# Patient Record
Sex: Female | Born: 1950
Health system: Southern US, Community
[De-identification: ages and names within clinical notes are randomized; demographics above are authoritative.]

## PROBLEM LIST (undated history)

## (undated) DIAGNOSIS — F32A Depression, unspecified: Secondary | ICD-10-CM

## (undated) DIAGNOSIS — C801 Malignant (primary) neoplasm, unspecified: Secondary | ICD-10-CM

## (undated) DIAGNOSIS — I10 Essential (primary) hypertension: Secondary | ICD-10-CM

## (undated) DIAGNOSIS — F329 Major depressive disorder, single episode, unspecified: Secondary | ICD-10-CM

## (undated) DIAGNOSIS — E119 Type 2 diabetes mellitus without complications: Secondary | ICD-10-CM

## (undated) DIAGNOSIS — K219 Gastro-esophageal reflux disease without esophagitis: Secondary | ICD-10-CM

## (undated) HISTORY — PX: OTHER SURGICAL HISTORY: SHX169

## (undated) HISTORY — PX: CHOLECYSTECTOMY: SHX55

---

## 1997-09-20 ENCOUNTER — Other Ambulatory Visit: Admission: RE | Admit: 1997-09-20 | Discharge: 1997-09-20 | Payer: Self-pay | Admitting: *Deleted

## 1997-09-26 ENCOUNTER — Other Ambulatory Visit: Admission: RE | Admit: 1997-09-26 | Discharge: 1997-09-26 | Payer: Self-pay | Admitting: *Deleted

## 1998-09-11 ENCOUNTER — Emergency Department (HOSPITAL_COMMUNITY): Admission: EM | Admit: 1998-09-11 | Discharge: 1998-09-11 | Payer: Self-pay | Admitting: *Deleted

## 1998-10-31 ENCOUNTER — Ambulatory Visit (HOSPITAL_COMMUNITY): Admission: RE | Admit: 1998-10-31 | Discharge: 1998-10-31 | Payer: Self-pay | Admitting: *Deleted

## 1998-10-31 ENCOUNTER — Encounter (INDEPENDENT_AMBULATORY_CARE_PROVIDER_SITE_OTHER): Payer: Self-pay

## 1998-12-18 ENCOUNTER — Other Ambulatory Visit: Admission: RE | Admit: 1998-12-18 | Discharge: 1998-12-18 | Payer: Self-pay | Admitting: *Deleted

## 2000-01-21 ENCOUNTER — Other Ambulatory Visit: Admission: RE | Admit: 2000-01-21 | Discharge: 2000-01-21 | Payer: Self-pay | Admitting: Obstetrics and Gynecology

## 2000-07-08 ENCOUNTER — Encounter: Admission: RE | Admit: 2000-07-08 | Discharge: 2000-07-08 | Payer: Self-pay | Admitting: General Surgery

## 2000-07-08 ENCOUNTER — Encounter: Payer: Self-pay | Admitting: General Surgery

## 2000-07-14 ENCOUNTER — Ambulatory Visit (HOSPITAL_COMMUNITY): Admission: RE | Admit: 2000-07-14 | Discharge: 2000-07-14 | Payer: Self-pay | Admitting: General Surgery

## 2000-07-14 ENCOUNTER — Encounter: Payer: Self-pay | Admitting: General Surgery

## 2000-07-22 ENCOUNTER — Encounter (INDEPENDENT_AMBULATORY_CARE_PROVIDER_SITE_OTHER): Payer: Self-pay | Admitting: *Deleted

## 2000-07-22 ENCOUNTER — Encounter: Payer: Self-pay | Admitting: General Surgery

## 2000-07-22 ENCOUNTER — Ambulatory Visit (HOSPITAL_BASED_OUTPATIENT_CLINIC_OR_DEPARTMENT_OTHER): Admission: RE | Admit: 2000-07-22 | Discharge: 2000-07-23 | Payer: Self-pay | Admitting: General Surgery

## 2000-08-04 ENCOUNTER — Inpatient Hospital Stay (HOSPITAL_COMMUNITY): Admission: AD | Admit: 2000-08-04 | Discharge: 2000-08-07 | Payer: Self-pay | Admitting: General Surgery

## 2000-08-19 ENCOUNTER — Ambulatory Visit (HOSPITAL_COMMUNITY): Admission: RE | Admit: 2000-08-19 | Discharge: 2000-08-19 | Payer: Self-pay | Admitting: General Surgery

## 2000-08-19 ENCOUNTER — Encounter: Payer: Self-pay | Admitting: General Surgery

## 2004-12-23 ENCOUNTER — Ambulatory Visit (HOSPITAL_COMMUNITY): Admission: RE | Admit: 2004-12-23 | Discharge: 2004-12-23 | Payer: Self-pay

## 2006-09-04 ENCOUNTER — Ambulatory Visit (HOSPITAL_COMMUNITY): Admission: RE | Admit: 2006-09-04 | Discharge: 2006-09-05 | Payer: Self-pay | Admitting: Neurosurgery

## 2010-03-18 DIAGNOSIS — I10 Essential (primary) hypertension: Secondary | ICD-10-CM | POA: Insufficient documentation

## 2010-03-18 DIAGNOSIS — E1159 Type 2 diabetes mellitus with other circulatory complications: Secondary | ICD-10-CM | POA: Insufficient documentation

## 2010-03-20 DIAGNOSIS — F32A Depression, unspecified: Secondary | ICD-10-CM | POA: Insufficient documentation

## 2010-03-20 DIAGNOSIS — C439 Malignant melanoma of skin, unspecified: Secondary | ICD-10-CM | POA: Insufficient documentation

## 2010-03-20 DIAGNOSIS — E782 Mixed hyperlipidemia: Secondary | ICD-10-CM | POA: Insufficient documentation

## 2010-05-21 NOTE — Op Note (Signed)
NAMEROSALEE, TOLLEY                 ACCOUNT NO.:  [id number]   MEDICAL RECORD NO.:  [id number]          PATIENT TYPE:  OIB   LOCATION:  5153                         FACILITY:  [facility]   PHYSICIAN:  Coletta Memos, M.D.     DATE OF BIRTH:  24-May-1950   DATE OF PROCEDURE:  09/04/2006  DATE OF DISCHARGE:  09/05/2006                               OPERATIVE REPORT   PREOPERATIVE DIAGNOSES:  1. Left C5 radiculopathy, C6 radiculopathy.  2. Displaced disk at C4-5.  3. Spondylosis, C4-5, C5-6.  4. Neck pain.   PROCEDURES:  1. Anterior cervical decompression, C4-5, C5-6.  2. Arthrodesis, C4 to C6, using two 6-mm allografts.  3. Anterior instrumentation, 34-mm Vector plate with 09-WJ screws.   SURGEON:  Coletta Memos, M.D.   ASSISTANT:  Hewitt Shorts, M.D.   COMPLICATIONS:  None.   ANESTHESIA:  General endotracheal.   INDICATIONS:  Gloria Rose is a 60 year old who presented to the office  with profound weakness in the left upper extremity in the left shoulder  and deltoid.  MRI showed a very large herniated disk on the left side at  C4-5.  She also had a fairly large osteophyte at C5-6, also on the left  side, causing foraminal narrowing.  I therefore recommended and she  agreed to undergo a two-level decompression and arthrodesis.   OPERATIVE NOTE:  Gloria Rose was brought to the operating room, intubated  and placed under a general anesthetic without difficulty.  The neck was  prepped and she was draped in a sterile fashion after it was placed on a  horseshoe headrest in approximately neutral position.  I infiltrated 4  mL 0.5% lidocaine and 1:200,000 strength epinephrine into the skin on  the left side of the neck starting from the midline, extending to the  medial border of the left sternocleidomastoid.  Mirroring my injection,  I opened the skin with a #10 blade.  I took that down to the platysma  through subcutaneous fat.  I dissected with Metzenbaum scissors in the  plane above  the platysma.  I then opened the platysma in a horizontal  fashion using the Metzenbaum scissors.  I dissected rostrally and  caudally inferior to the platysma.  I then was able to identify the  medial strap muscles, sternocleidomastoid, and with both sharp and blunt  dissection created an avascular corridor to the cervical spine.  I  placed a spinal needle into the disk space and that was shown to be at  C4-5.  I then reflected the longus colli muscles bilaterally from C4 to  C6.  I placed a self-retaining retractor around the disk space of C4-5.  I opened the disk space with a #15 blade and removed some disk material.  I then placed distraction pins, one at C4, the other in C5, and  distracted the disk space.  I used pituitary rongeurs, curettes,  Kerrison punches and a high-speed drill to remove endplate and disk  material.  I brought the microscope into the operative field at that  time.  I then removed what were three very  large fragments of disk the  overlying the left C5 nerve root.  This was done without difficulty.  On  the right side I did not do an aggressive decompression of the nerve  roots as there was no compression seen or appreciated intraoperatively,  but I did fully decompress the spinal canal and the left C5 root.  I  achieved hemostasis using Gelfoam and patience.   I then prepared for arthrodesis.  I used the drill to even the surfaces  of C4 and C5.  I sized the space and felt that a 6-mm graft would be  appropriate.  I then placed a 6-mm graft to complete the arthrodesis at  C4-5.   I removed the distraction pin from C4 and placed it into C6.  I moved  the self-retaining retractor caudally to overlie the disk to expose the  disk space at C5-6.  I then had to use the drill to actually drill away  the calcified disk surface at C5-6 until I could get into the disk  space.  I then brought the microscope back into the operative field.  I  then used a drill extensively  to remove osteophytes at the C5-6 level.  I used two different burs to create more space and again to drill down  the osteophytes.  I then was able to use a small micro hook to lift up  the bone there and create an entry for a Kerrison punch.  I then used a  Kerrison sponge and created a trowel and exposed the dura.  Then again  going back to the drill and the punch, I was able to then fully expose  the dura and fully decompress the spinal canal and both C6 nerve roots.  I irrigated the wound.  With Dr. Newell Coral assistance, I then prepared  for arthrodesis.   I evened the bony surfaces at C5 and C6 and placed a 6-mm structural  allograft into the disk space.  I then turned my attention to the  anterior instrumentation.  Using a 34-mm plate and a drill, I drilled  and placed six screws , two in C4, two in C5, and two in C6.  Each of  the screws was self-tapping.  The plate was placed without difficulty.  X-ray showed the plate, screws and bone plugs were in good position.  I  then irrigated the wound.  I then closed the wound in a layered fashion  using Vicryl sutures to reapproximate the platysma and subcuticular  layers.  Dermabond was used for a sterile dressing.           ______________________________  Coletta Memos, M.D.     KC/MEDQ  D:  09/04/2006  T:  09/05/2006  Job:  [id number]

## 2010-05-24 NOTE — Op Note (Signed)
[place]. [facility]  Patient:    Gloria Rose, Gloria Rose                        MRN: [id number] Proc. Date: 07/22/00 Adm. Date:  [id number] Disc. Date: [id number] Attending:  Janalyn Rouse                           Operative Report  PREOPERATIVE DIAGNOSIS:  Malignant melanoma, distal aspect of left great toe.  POSTOPERATIVE DIAGNOSIS:  Malignant melanoma, distal aspect of left great toe.  OPERATION PERFORMED:  Amputation of left great toe at interphalangeal joint level.  SURGEON:  Loreta Ave, M.D.  ANESTHESIA:  General.  ESTIMATED BLOOD LOSS:  Minimal.  TOURNIQUET TIME:  45 minutes.  SPECIMENS:  Excised amputated toe sent for staging of melanoma.  DESCRIPTION OF PROCEDURE:  The patient was brought to the operating room and after adequate anesthesia had been obtained under the care of Dr. Francina Ames, she had undergone excision of a sentinel node for staging of her cancer. While still under general anesthesia, I intervened with primary amputation of her left great toe.  A tourniquet applied left leg.  The leg was then prepped and draped in the usual sterile fashion.  Exsanguinated elevation Esmarch. Tourniquet inflated.  The toe which had staining with methylene blue from injection for sentinel lymph node had an obvious pigmented melanoma at the distal aspect.  A fish mouth plantar based excision was done at the level of the interphalangeal joint staying well away from the margins of the obvious lesion at the distal aspect of the great toe.  The great toe was excised en bloc distal to the interphalangeal joint after dissection after dissection was carried down to that level and disarticulation performed with a sharp cutting of neurovascular bundles and appropriate hemostasis.  Specimen was to be sent to pathology.  Wound irrigated.  Remaining articular cartilage was removed with a rongeur to a nice contoured surface of the proximal phalanx.   The extensors and flexors were then sewn together with Vicryl over the end of the phalanx leaving the sesamoid intact on the plantar aspect which was located at the IP joint.  Skin was then closed with interrupted nylon bringing this into a nice closure with the incision line being slightly dorsal utilizing a slightly longer plantar flap.  Sterile compressive dressing was applied. Preoperative digital block had already been performed by Dr. Maple Hudson for postoperative analgesia.  Tourniquet deflated.  Once Dr. Maple Hudson and I had both completed anesthesia reversed.  Brought to recovery room.  Tolerated surgery well.  No complications. DD:  08/02/00 TD:  08/03/00 Job: [id number]

## 2010-05-24 NOTE — Op Note (Signed)
[place]. [facility]  Patient:    Gloria Rose, Gloria Rose                        MRN: [id number] Proc. Date: 07/22/00 Adm. Date:  [id number] Attending:  Janalyn Rouse                           Operative Report  PREOPERATIVE DIAGNOSIS:  Melanoma of the left great toe.  POSTOPERATIVE DIAGNOSIS:  Melanoma of the left great toe.  OPERATION PERFORMED: 1. Blue dye injection. 2. Left inguinal sentinel lymph node biopsy. 3. Left toe amputation.  SURGEON:  Rose Phi. Maple Hudson, M.D.  ASSISTANT:  Loreta Ave, M.D.  ANESTHESIA:  General.  INDICATIONS FOR PROCEDURE:  This patient presented with a melanoma of the left great toe.  She had had punch biopsies done which showed a 0.75 mm thick superficial spreading melanoma.  She had a preoperative lymphoscintigram done which showed that the drainage was to the inguinal area.  DESCRIPTION OF PROCEDURE:  Prior to coming to the operating room, 1 mCi of technetium sulfur colloid was injected intradermally around the melanoma. After suitable general anesthesia was induced the patient was placed in the supine position.  1 cc of Lymphazurin blue was then injected intradermally around the melanoma that was in the pulp portion of the distal left great toe. Scanning of the left inguinal area revealed a hot spot which was marked.  We then prepped the foot, ankle and the inguinal area and draped it appropriately.  With the Neoprobe, I marked the hot spot in the left inguinal area and then a vertical incision was made overlying it.  We dissected through the subcutaneous tissues and put a self-retaining retractor in.  One could identify a blue and hot lymph node, probably two, and I excised those using a cautery.  Scanning of the inguinal area revealed no other hot spots.  These nodes were submitted to the pathologist.  The incision was closed with 3-0 Vicryl for the subcutaneous tissues and staples for the skin.  In the  meantime, Dr. Eulah Pont did the amputation of the left great toe which will be in a separate operative note. DD:  07/22/00 TD:  07/22/00 Job: [id number]

## 2010-05-24 NOTE — H&P (Signed)
[place]. [facility]  Patient:    Gloria Rose, Gloria Rose Visit Number: [id number] MRN: [id number]          Service Type: MED Location: [phone] Attending Physician:  Janalyn Rouse Dictated by:   Rose Phi. Maple Hudson, M.D. Adm. Date:  08/04/2000 Disc. Date: 08/07/2000                           History and Physical  HISTORY OF PRESENT ILLNESS:  This 60 year old female had presented with lesion in her distal left great toe which turned out to be a melanoma.  She had been seen in consultation by Dr. Leticia Penna at [facility], and then referred here. She underwent an amputation of the distal toe, with primary closure, and a sentinel lymph node biopsy.  The sentinel node turned out to be negative, and she had good margins and a favorable lesion with her toe, but she presented this morning in the office with a cellulitis of the groin.  This was opened and drained, some mainly serous fluid.  She was started on Cipro, but she then developed a temperature of 104 degrees, and she was then admitted to the hospital.  PAST SURGICAL HISTORY:  Just consisted of the melanoma surgery as noted above.  ALLERGIES:  PENICILLIN AND RED DYE.  FAMILY HISTORY:  Not remarkable.  REVIEW OF SYSTEMS:  Otherwise within normal limits.  PHYSICAL EXAMINATION:  VITAL SIGNS:  Temperature 103 degrees, pulse rate 117, respirations 20, blood pressure 160/80.  HEENT:  Within normal limits.  NECK:  Supple, without masses.  LUNGS:  Clear to auscultation.  HEART:  Normal sinus rhythm without murmurs.  BREASTS:  Bilateral examination was within normal limits.  ABDOMEN:  Completely within normal limits.  EXTREMITIES:  She had a cellulitis of her left groin, and she had a well-healing amputation of the left great toe.  IMPRESSION:  Postoperative infection from a sentinel node biopsy in the left inguinal area.  PLAN:  She is admitted for IV antibiotics. Dictated by:   Rose Phi. Maple Hudson,  M.D. Attending Physician:  Janalyn Rouse DD:  09/01/00 TD:  09/01/00 Job: [id number]

## 2010-05-24 NOTE — Discharge Summary (Signed)
[place]. [facility]  Patient:    Gloria Rose, Gloria Rose Visit Number: [id number] MRN: [id number]          Service Type: MED Location: 5000 5006 01 Attending Physician:  Janalyn Rouse Dictated by:   Rose Phi. Maple Hudson, M.D. Adm. Date:  08/04/2000 Disc. Date: 08/07/2000                             Discharge Summary  HISTORY OF PRESENT ILLNESS:  This 60 year old white married female had undergone amputation of the distal left toe and a left inguinal sentinel lymph node biopsy for melanoma.  She had done well, with favorable findings except she developed a postoperative cellulitis of the left inguinal area with a temperature spike to 104.  She was then admitted for treatment.  Physical findings were noted above.  HOSPITAL COURSE:  She was placed on IV Cipro, and she had pretty prompt resolution of her fever and her white count.  By the third day, she was doing well and was discharged on Cipro orally, to be followed as an outpatient.  FINAL DIAGNOSIS:  Postoperative wound infection of the left inguinal sentinel node biopsy site from a melanoma.  OPERATIONS:  None.  CONDITION ON DISCHARGE:  Improved.  DISCHARGE MEDICATIONS:  Cipro 500 b.i.d.  DIET:  Regular.  ACTIVITY:  Limited.  FOLLOW-UP:  To be seen in five days in the office. Dictated by:   Rose Phi. Maple Hudson, M.D. Attending Physician:  Janalyn Rouse DD:  09/01/00 TD:  09/01/00 Job: [id number]

## 2010-10-18 LAB — BASIC METABOLIC PANEL
CO2: 32
Chloride: 102
GFR calc Af Amer: >60
Sodium: 138

## 2010-10-18 LAB — CBC
Hemoglobin: 14
MCHC: 34.2
MCV: 82
RBC: 4.98

## 2012-04-15 DIAGNOSIS — S4990XA Unspecified injury of shoulder and upper arm, unspecified arm, initial encounter: Secondary | ICD-10-CM | POA: Insufficient documentation

## 2012-04-15 DIAGNOSIS — S98119A Complete traumatic amputation of unspecified great toe, initial encounter: Secondary | ICD-10-CM | POA: Insufficient documentation

## 2012-04-17 DIAGNOSIS — K529 Noninfective gastroenteritis and colitis, unspecified: Secondary | ICD-10-CM | POA: Insufficient documentation

## 2012-06-10 DIAGNOSIS — R600 Localized edema: Secondary | ICD-10-CM | POA: Insufficient documentation

## 2012-06-10 DIAGNOSIS — L989 Disorder of the skin and subcutaneous tissue, unspecified: Secondary | ICD-10-CM | POA: Insufficient documentation

## 2012-09-30 DIAGNOSIS — K219 Gastro-esophageal reflux disease without esophagitis: Secondary | ICD-10-CM | POA: Insufficient documentation

## 2013-12-26 ENCOUNTER — Other Ambulatory Visit (HOSPITAL_COMMUNITY): Payer: Self-pay | Admitting: Internal Medicine

## 2013-12-26 DIAGNOSIS — M8580 Other specified disorders of bone density and structure, unspecified site: Secondary | ICD-10-CM

## 2013-12-26 DIAGNOSIS — Z1231 Encounter for screening mammogram for malignant neoplasm of breast: Secondary | ICD-10-CM

## 2014-01-09 ENCOUNTER — Ambulatory Visit (HOSPITAL_COMMUNITY)
Admission: RE | Admit: 2014-01-09 | Discharge: 2014-01-09 | Disposition: A | Discharge status: Home / Self Care | Payer: Medicare Other | Source: Ambulatory Visit | Attending: Internal Medicine | Admitting: Internal Medicine

## 2014-01-09 ENCOUNTER — Ambulatory Visit (HOSPITAL_COMMUNITY): Payer: Self-pay

## 2014-01-09 ENCOUNTER — Other Ambulatory Visit (HOSPITAL_COMMUNITY): Payer: Self-pay | Admitting: Internal Medicine

## 2014-01-09 ENCOUNTER — Other Ambulatory Visit (HOSPITAL_COMMUNITY): Payer: Self-pay

## 2014-01-09 DIAGNOSIS — R29890 Loss of height: Secondary | ICD-10-CM | POA: Diagnosis not present

## 2014-01-09 DIAGNOSIS — M8580 Other specified disorders of bone density and structure, unspecified site: Secondary | ICD-10-CM | POA: Diagnosis not present

## 2014-01-09 DIAGNOSIS — Z780 Asymptomatic menopausal state: Secondary | ICD-10-CM | POA: Diagnosis not present

## 2014-01-09 DIAGNOSIS — Z13820 Encounter for screening for osteoporosis: Secondary | ICD-10-CM | POA: Diagnosis not present

## 2014-01-09 DIAGNOSIS — Z1231 Encounter for screening mammogram for malignant neoplasm of breast: Secondary | ICD-10-CM | POA: Diagnosis not present

## 2014-02-23 DIAGNOSIS — L719 Rosacea, unspecified: Secondary | ICD-10-CM | POA: Diagnosis not present

## 2014-02-23 DIAGNOSIS — E119 Type 2 diabetes mellitus without complications: Secondary | ICD-10-CM | POA: Diagnosis not present

## 2014-02-23 DIAGNOSIS — M25561 Pain in right knee: Secondary | ICD-10-CM | POA: Diagnosis not present

## 2014-02-23 DIAGNOSIS — Z6834 Body mass index (BMI) 34.0-34.9, adult: Secondary | ICD-10-CM | POA: Diagnosis not present

## 2014-05-16 DIAGNOSIS — M1712 Unilateral primary osteoarthritis, left knee: Secondary | ICD-10-CM | POA: Diagnosis not present

## 2014-05-16 DIAGNOSIS — M25562 Pain in left knee: Secondary | ICD-10-CM | POA: Diagnosis not present

## 2014-05-16 DIAGNOSIS — M25462 Effusion, left knee: Secondary | ICD-10-CM | POA: Diagnosis not present

## 2014-06-01 DIAGNOSIS — M25561 Pain in right knee: Secondary | ICD-10-CM | POA: Diagnosis not present

## 2014-06-02 ENCOUNTER — Other Ambulatory Visit: Payer: Self-pay | Admitting: Orthopedic Surgery

## 2014-06-02 DIAGNOSIS — M25562 Pain in left knee: Secondary | ICD-10-CM

## 2014-06-08 ENCOUNTER — Ambulatory Visit
Admission: RE | Admit: 2014-06-08 | Discharge: 2014-06-08 | Disposition: A | Discharge status: Home / Self Care | Payer: Self-pay | Source: Ambulatory Visit | Attending: Orthopedic Surgery | Admitting: Orthopedic Surgery

## 2014-06-08 ENCOUNTER — Other Ambulatory Visit: Payer: Self-pay | Admitting: Orthopedic Surgery

## 2014-06-08 ENCOUNTER — Ambulatory Visit
Admission: RE | Admit: 2014-06-08 | Discharge: 2014-06-08 | Disposition: A | Discharge status: Home / Self Care | Payer: Medicare Other | Source: Ambulatory Visit | Attending: Orthopedic Surgery | Admitting: Orthopedic Surgery

## 2014-06-08 DIAGNOSIS — M25562 Pain in left knee: Secondary | ICD-10-CM

## 2014-06-08 DIAGNOSIS — Z01818 Encounter for other preprocedural examination: Secondary | ICD-10-CM | POA: Diagnosis not present

## 2014-06-08 DIAGNOSIS — M1712 Unilateral primary osteoarthritis, left knee: Secondary | ICD-10-CM | POA: Diagnosis not present

## 2014-06-08 DIAGNOSIS — R600 Localized edema: Secondary | ICD-10-CM | POA: Diagnosis not present

## 2014-06-22 ENCOUNTER — Encounter (HOSPITAL_COMMUNITY)
Admission: RE | Admit: 2014-06-22 | Discharge: 2014-06-22 | Disposition: A | Discharge status: Home / Self Care | Payer: Medicare Other | Source: Ambulatory Visit | Attending: Orthopedic Surgery | Admitting: Orthopedic Surgery

## 2014-06-22 ENCOUNTER — Ambulatory Visit (HOSPITAL_COMMUNITY)
Admission: RE | Admit: 2014-06-22 | Discharge: 2014-06-22 | Disposition: A | Discharge status: Home / Self Care | Payer: Medicare Other | Source: Ambulatory Visit | Attending: Orthopedic Surgery | Admitting: Orthopedic Surgery

## 2014-06-22 ENCOUNTER — Encounter (HOSPITAL_COMMUNITY): Payer: Self-pay

## 2014-06-22 DIAGNOSIS — Z01818 Encounter for other preprocedural examination: Secondary | ICD-10-CM | POA: Diagnosis not present

## 2014-06-22 DIAGNOSIS — Z01812 Encounter for preprocedural laboratory examination: Secondary | ICD-10-CM | POA: Diagnosis not present

## 2014-06-22 DIAGNOSIS — M1712 Unilateral primary osteoarthritis, left knee: Secondary | ICD-10-CM | POA: Diagnosis not present

## 2014-06-22 HISTORY — DX: Malignant (primary) neoplasm, unspecified: C80.1

## 2014-06-22 HISTORY — DX: Gastro-esophageal reflux disease without esophagitis: K21.9

## 2014-06-22 HISTORY — DX: Depression, unspecified: F32.A

## 2014-06-22 HISTORY — DX: Major depressive disorder, single episode, unspecified: F32.9

## 2014-06-22 HISTORY — DX: Type 2 diabetes mellitus without complications: E11.9

## 2014-06-22 HISTORY — DX: Essential (primary) hypertension: I10

## 2014-06-22 LAB — PROTIME-INR
INR: 1.12 (ref 0.00–1.49)
Prothrombin Time: 14.6 seconds (ref 11.6–15.2)

## 2014-06-22 LAB — CBC WITH DIFFERENTIAL/PLATELET
BASOS PCT: 0 % (ref 0–1)
Basophils Absolute: 0 10*3/uL (ref 0.0–0.1)
EOS ABS: 0.6 10*3/uL (ref 0.0–0.7)
Eosinophils Relative: 7 % — ABNORMAL HIGH (ref 0–5)
HCT: 42.7 % (ref 36.0–46.0)
HEMOGLOBIN: 13.9 g/dL (ref 12.0–15.0)
Lymphocytes Relative: 29 % (ref 12–46)
Lymphs Abs: 2.7 10*3/uL (ref 0.7–4.0)
MCH: 27.2 pg (ref 26.0–34.0)
MCHC: 32.6 g/dL (ref 30.0–36.0)
MCV: 83.6 fL (ref 78.0–100.0)
MONO ABS: 0.8 10*3/uL (ref 0.1–1.0)
Monocytes Relative: 8 % (ref 3–12)
Neutro Abs: 5.2 10*3/uL (ref 1.7–7.7)
Neutrophils Relative %: 56 % (ref 43–77)
Platelets: 293 10*3/uL (ref 150–400)
RBC: 5.11 MIL/uL (ref 3.87–5.11)
RDW: 13.1 % (ref 11.5–15.5)
WBC: 9.4 10*3/uL (ref 4.0–10.5)

## 2014-06-22 LAB — COMPREHENSIVE METABOLIC PANEL
ALT: 36 U/L (ref 14–54)
AST: 42 U/L — ABNORMAL HIGH (ref 15–41)
Albumin: 3.7 g/dL (ref 3.5–5.0)
Alkaline Phosphatase: 93 U/L (ref 38–126)
Anion gap: 9 (ref 5–15)
BUN: 19 mg/dL (ref 6–20)
CALCIUM: 9.5 mg/dL (ref 8.9–10.3)
CO2: 29 mmol/L (ref 22–32)
Chloride: 100 mmol/L — ABNORMAL LOW (ref 101–111)
Creatinine, Ser: 0.96 mg/dL (ref 0.44–1.00)
GFR calc Af Amer: >60 mL/min (ref >60)
GFR calc non Af Amer: >60 mL/min (ref >60)
GLUCOSE: 130 mg/dL — AB (ref 65–99)
POTASSIUM: 3.9 mmol/L (ref 3.5–5.1)
SODIUM: 138 mmol/L (ref 135–145)
TOTAL PROTEIN: 6.9 g/dL (ref 6.5–8.1)
Total Bilirubin: 0.4 mg/dL (ref 0.3–1.2)

## 2014-06-22 LAB — URINALYSIS, ROUTINE W REFLEX MICROSCOPIC
BILIRUBIN URINE: NEGATIVE
Glucose, UA: NEGATIVE mg/dL
HGB URINE DIPSTICK: NEGATIVE
Ketones, ur: NEGATIVE mg/dL
Leukocytes, UA: NEGATIVE
Nitrite: NEGATIVE
Protein, ur: NEGATIVE mg/dL
SPECIFIC GRAVITY, URINE: 1.024 (ref 1.005–1.030)
UROBILINOGEN UA: 1 mg/dL (ref 0.0–1.0)
pH: 5 (ref 5.0–8.0)

## 2014-06-22 LAB — APTT: aPTT: 28 seconds (ref 24–37)

## 2014-06-22 LAB — GLUCOSE, CAPILLARY: Glucose-Capillary: 155 mg/dL — ABNORMAL HIGH (ref 65–99)

## 2014-06-22 LAB — SURGICAL PCR SCREEN
MRSA, PCR: NEGATIVE
Staphylococcus aureus: NEGATIVE

## 2014-06-22 NOTE — Progress Notes (Addendum)
Patient denies any cardiac issues.   Is type 2 diabetic, dx 6-7 yrs ago.  PCP is Dr. Delphina Cahill [phone]) LOV 1 month ago..   I have requested a copy of ekg (via FAX)  which she states was done a couple of months ago.  (office is closed today-Thursday)

## 2014-06-22 NOTE — Progress Notes (Signed)
   06/22/14 1257  OBSTRUCTIVE SLEEP APNEA  Have you ever been diagnosed with sleep apnea through a sleep study? No  Do you snore loudly (loud enough to be heard through closed doors)?  1  Do you often feel tired, fatigued, or sleepy during the daytime? 1  Has anyone observed you stop breathing during your sleep? 0  Do you have, or are you being treated for high blood pressure? 1  BMI more than 35 kg/m2? 0  Age over 66 years old? 1  Neck circumference greater than 40 cm/16 inches? 0  Gender: 0

## 2014-06-22 NOTE — Pre-Procedure Instructions (Signed)
Gloria Rose  06/22/2014      [facility] - [place], [place] - [address] [place] [address] Phone: [phone] Fax: [phone]    Your procedure is scheduled on  Monday  07/03/14  Report to [place] at 800 A.M.  Call this number if you have problems the morning of surgery:  [phone]   Remember:  Do not eat food or drink liquids after midnight.  Take these medicines the morning of surgery with A SIP OF WATER   amolodipine (norvasc), atenolol (tenormin), omeprazole, paroxetine (paxil)   Do not wear jewelry, make-up or nail polish.  Do not wear lotions, powders, or perfumes.     Do not shave underarms & legs 48 hours prior to surgery.     Do not bring valuables to the hospital.  [facility] is not responsible for any belongings or valuables.  Contacts, dentures or bridgework may not be worn into surgery.  Leave your suitcase in the car.  After surgery it may be brought to your room.  For patients admitted to the hospital, discharge time will be determined by your treatment team.  Name and phone number of your driver:    [place]  Special instructions:  [facility] - Preparing for Surgery  Before surgery, you can play an important role.  Because skin is not sterile, your skin needs to be as free of germs as possible.  You can reduce the number of germs on you skin by washing with CHG (chlorahexidine gluconate) soap before surgery.  CHG is an antiseptic cleaner which kills germs and bonds with the skin to continue killing germs even after washing.  Please DO NOT use if you have an allergy to CHG or antibacterial soaps.  If your skin becomes reddened/irritated stop using the CHG and inform your nurse when you arrive at Short Stay.  Do not shave (including legs and underarms) for at least 48 hours prior to the first CHG shower.  You may shave your face.  Please follow these instructions  carefully:   1.  Shower with CHG Soap the night before surgery and the                                morning of Surgery.  2.  If you choose to wash your hair, wash your hair first as usual with your       normal shampoo.  3.  After you shampoo, rinse your hair and body thoroughly to remove the                      Shampoo.  4.  Use CHG as you would any other liquid soap.  You can apply chg directly       to the skin and wash gently with scrungie or a clean washcloth.  5.  Apply the CHG Soap to your body ONLY FROM THE NECK DOWN.        Do not use on open wounds or open sores.  Avoid contact with your eyes,       ears, mouth and genitals (private parts).  Wash genitals (private parts)       with your normal soap.  6.  Wash thoroughly, paying special attention to the area where your surgery        will be performed.  7.  Thoroughly rinse your body with warm water from the neck down.  8.  DO NOT shower/wash with your normal soap after using and rinsing off       the CHG Soap.  9.  Pat yourself dry with a clean towel.            10.  Wear clean pajamas.            11.  Place clean sheets on your bed the night of your first shower and do not        sleep with pets.  Day of Surgery  Do not apply any lotions/deoderants the morning of surgery.  Please wear clean clothes to the hospital/surgery center.    Please read over the following fact sheets that you were given. Pain Booklet, Coughing and Deep Breathing and Blood Transfusion Information

## 2014-06-24 LAB — URINE CULTURE

## 2014-06-28 DIAGNOSIS — M25562 Pain in left knee: Secondary | ICD-10-CM | POA: Insufficient documentation

## 2014-06-28 DIAGNOSIS — M1712 Unilateral primary osteoarthritis, left knee: Secondary | ICD-10-CM | POA: Diagnosis not present

## 2014-06-29 NOTE — Progress Notes (Signed)
Re-requested EKG from Dr Wende Neighbors and they stated they will fax it.

## 2014-07-03 ENCOUNTER — Encounter (HOSPITAL_COMMUNITY): Payer: Self-pay | Admitting: Critical Care Medicine

## 2014-07-03 ENCOUNTER — Inpatient Hospital Stay (HOSPITAL_COMMUNITY)
Admission: RE | Admit: 2014-07-03 | Discharge: 2014-07-05 | DRG: 470 | Disposition: A | Discharge status: Home Health | Payer: Medicare Other | Source: Ambulatory Visit | Attending: Orthopedic Surgery | Admitting: Orthopedic Surgery

## 2014-07-03 ENCOUNTER — Encounter (HOSPITAL_COMMUNITY)
Admission: RE | Disposition: A | Discharge status: Home / Self Care | Payer: Self-pay | Source: Ambulatory Visit | Attending: Orthopedic Surgery

## 2014-07-03 ENCOUNTER — Inpatient Hospital Stay (HOSPITAL_COMMUNITY): Payer: Medicare Other | Admitting: Critical Care Medicine

## 2014-07-03 DIAGNOSIS — M179 Osteoarthritis of knee, unspecified: Secondary | ICD-10-CM | POA: Diagnosis not present

## 2014-07-03 DIAGNOSIS — E119 Type 2 diabetes mellitus without complications: Secondary | ICD-10-CM | POA: Diagnosis present

## 2014-07-03 DIAGNOSIS — I10 Essential (primary) hypertension: Secondary | ICD-10-CM | POA: Diagnosis not present

## 2014-07-03 DIAGNOSIS — Z96652 Presence of left artificial knee joint: Secondary | ICD-10-CM | POA: Diagnosis not present

## 2014-07-03 DIAGNOSIS — F329 Major depressive disorder, single episode, unspecified: Secondary | ICD-10-CM | POA: Diagnosis not present

## 2014-07-03 DIAGNOSIS — M1712 Unilateral primary osteoarthritis, left knee: Secondary | ICD-10-CM | POA: Diagnosis not present

## 2014-07-03 DIAGNOSIS — K219 Gastro-esophageal reflux disease without esophagitis: Secondary | ICD-10-CM | POA: Diagnosis not present

## 2014-07-03 DIAGNOSIS — D62 Acute posthemorrhagic anemia: Secondary | ICD-10-CM | POA: Diagnosis not present

## 2014-07-03 DIAGNOSIS — Z96659 Presence of unspecified artificial knee joint: Secondary | ICD-10-CM

## 2014-07-03 DIAGNOSIS — Z880 Allergy status to penicillin: Secondary | ICD-10-CM | POA: Diagnosis not present

## 2014-07-03 HISTORY — PX: TOTAL KNEE ARTHROPLASTY: SHX125

## 2014-07-03 LAB — GLUCOSE, CAPILLARY
GLUCOSE-CAPILLARY: 141 mg/dL — AB (ref 65–99)
Glucose-Capillary: 142 mg/dL — ABNORMAL HIGH (ref 65–99)
Glucose-Capillary: 203 mg/dL — ABNORMAL HIGH (ref 65–99)
Glucose-Capillary: 160 mg/dL — ABNORMAL HIGH (ref 65–99)

## 2014-07-03 LAB — CREATININE, SERUM
CREATININE: 0.8 mg/dL (ref 0.44–1.00)
GFR calc non Af Amer: >60 mL/min (ref >60)

## 2014-07-03 LAB — CBC
HEMATOCRIT: 35.7 % — AB (ref 36.0–46.0)
HEMOGLOBIN: 11.8 g/dL — AB (ref 12.0–15.0)
MCH: 27.1 pg (ref 26.0–34.0)
MCHC: 33.1 g/dL (ref 30.0–36.0)
MCV: 81.9 fL (ref 78.0–100.0)
PLATELETS: 207 10*3/uL (ref 150–400)
RBC: 4.36 MIL/uL (ref 3.87–5.11)
RDW: 12.8 % (ref 11.5–15.5)
WBC: 7.3 10*3/uL (ref 4.0–10.5)

## 2014-07-03 SURGERY — ARTHROPLASTY, KNEE, TOTAL
Anesthesia: Monitor Anesthesia Care | Site: Knee | Laterality: Left

## 2014-07-03 MED ORDER — VANCOMYCIN HCL IN DEXTROSE 1-5 GM/200ML-% IV SOLN
1000.0000 mg | Freq: Two times a day (BID) | INTRAVENOUS | Status: AC
  Administered 2014-07-03: 1000 mg via INTRAVENOUS
  Filled 2014-07-03: qty 200

## 2014-07-03 MED ORDER — CHLORHEXIDINE GLUCONATE 4 % EX LIQD: 60.0000 mL | Freq: Once | CUTANEOUS | Status: DC

## 2014-07-03 MED ORDER — HYDROCHLOROTHIAZIDE 25 MG PO TABS
25.0000 mg | ORAL_TABLET | Freq: Every day | ORAL | Status: DC
  Administered 2014-07-04 – 2014-07-05 (×2): 25 mg via ORAL
  Filled 2014-07-03 (×3): qty 1

## 2014-07-03 MED ORDER — TRANEXAMIC ACID 1000 MG/10ML IV SOLN
1000.0000 mg | INTRAVENOUS | Status: AC
  Administered 2014-07-03: 1000 mg via INTRAVENOUS
  Filled 2014-07-03: qty 10

## 2014-07-03 MED ORDER — ENOXAPARIN SODIUM 30 MG/0.3ML ~~LOC~~ SOLN
30.0000 mg | Freq: Two times a day (BID) | SUBCUTANEOUS | Status: DC
  Administered 2014-07-04 – 2014-07-05 (×3): 30 mg via SUBCUTANEOUS
  Filled 2014-07-03 (×3): qty 0.3

## 2014-07-03 MED ORDER — OXYCODONE HCL 5 MG PO TABS
5.0000 mg | ORAL_TABLET | ORAL | Status: DC | PRN
  Administered 2014-07-03 – 2014-07-05 (×4): 10 mg via ORAL
  Filled 2014-07-03 (×4): qty 2

## 2014-07-03 MED ORDER — ACETAMINOPHEN 650 MG RE SUPP: 650.0000 mg | Freq: Four times a day (QID) | RECTAL | Status: DC | PRN

## 2014-07-03 MED ORDER — SODIUM CHLORIDE 0.9 % IR SOLN
Status: DC | PRN
  Administered 2014-07-03: 3000 mL

## 2014-07-03 MED ORDER — METHOCARBAMOL 500 MG PO TABS
500.0000 mg | ORAL_TABLET | Freq: Four times a day (QID) | ORAL | Status: DC | PRN
  Administered 2014-07-03 – 2014-07-05 (×3): 500 mg via ORAL
  Filled 2014-07-03 (×3): qty 1

## 2014-07-03 MED ORDER — DOCUSATE SODIUM 100 MG PO CAPS
100.0000 mg | ORAL_CAPSULE | Freq: Two times a day (BID) | ORAL | Status: DC
  Administered 2014-07-04 – 2014-07-05 (×2): 100 mg via ORAL
  Filled 2014-07-03 (×4): qty 1

## 2014-07-03 MED ORDER — BISACODYL 5 MG PO TBEC: 5.0000 mg | DELAYED_RELEASE_TABLET | Freq: Every day | ORAL | Status: DC | PRN

## 2014-07-03 MED ORDER — METOCLOPRAMIDE HCL 5 MG PO TABS: 5.0000 mg | ORAL_TABLET | Freq: Three times a day (TID) | ORAL | Status: DC | PRN

## 2014-07-03 MED ORDER — BUPIVACAINE LIPOSOME 1.3 % IJ SUSP
20.0000 mL | Freq: Once | INTRAMUSCULAR | Status: AC
Start: 2014-07-03 — End: 2014-07-04
  Administered 2014-07-03: 20 mL
  Filled 2014-07-03: qty 20

## 2014-07-03 MED ORDER — ACETAMINOPHEN 325 MG PO TABS: 650.0000 mg | ORAL_TABLET | Freq: Four times a day (QID) | ORAL | Status: DC | PRN

## 2014-07-03 MED ORDER — PHENOL 1.4 % MT LIQD: 1.0000 | OROMUCOSAL | Status: DC | PRN

## 2014-07-03 MED ORDER — SODIUM CHLORIDE 0.9 % IV SOLN: INTRAVENOUS | Status: DC

## 2014-07-03 MED ORDER — ONDANSETRON HCL 4 MG/2ML IJ SOLN
INTRAMUSCULAR | Status: DC | PRN
  Administered 2014-07-03: 4 mg via INTRAVENOUS

## 2014-07-03 MED ORDER — DIPHENHYDRAMINE HCL 12.5 MG/5ML PO ELIX
12.5000 mg | ORAL_SOLUTION | ORAL | Status: DC | PRN
Start: 2014-07-03 — End: 2014-07-05

## 2014-07-03 MED ORDER — METFORMIN HCL 500 MG PO TABS
500.0000 mg | ORAL_TABLET | Freq: Two times a day (BID) | ORAL | Status: DC
  Administered 2014-07-03 – 2014-07-05 (×4): 500 mg via ORAL
  Filled 2014-07-03 (×4): qty 1

## 2014-07-03 MED ORDER — ZOLPIDEM TARTRATE 5 MG PO TABS: 5.0000 mg | ORAL_TABLET | Freq: Every evening | ORAL | Status: DC | PRN

## 2014-07-03 MED ORDER — ALUM & MAG HYDROXIDE-SIMETH 200-200-20 MG/5ML PO SUSP
30.0000 mL | ORAL | Status: DC | PRN
  Administered 2014-07-03 – 2014-07-05 (×2): 30 mL via ORAL
  Filled 2014-07-03 (×2): qty 30

## 2014-07-03 MED ORDER — ONDANSETRON HCL 4 MG PO TABS: 4.0000 mg | ORAL_TABLET | Freq: Four times a day (QID) | ORAL | Status: DC | PRN

## 2014-07-03 MED ORDER — PROPOFOL INFUSION 10 MG/ML OPTIME
INTRAVENOUS | Status: DC | PRN
  Administered 2014-07-03: 50 ug/kg/min via INTRAVENOUS

## 2014-07-03 MED ORDER — FENTANYL CITRATE (PF) 100 MCG/2ML IJ SOLN
INTRAMUSCULAR | Status: DC | PRN
  Administered 2014-07-03 (×2): 25 ug via INTRAVENOUS

## 2014-07-03 MED ORDER — PROPOFOL 10 MG/ML IV BOLUS
INTRAVENOUS | Status: AC
  Filled 2014-07-03: qty 20

## 2014-07-03 MED ORDER — EPHEDRINE SULFATE 50 MG/ML IJ SOLN
INTRAMUSCULAR | Status: AC
  Filled 2014-07-03: qty 1

## 2014-07-03 MED ORDER — METHOCARBAMOL 1000 MG/10ML IJ SOLN
500.0000 mg | Freq: Four times a day (QID) | INTRAMUSCULAR | Status: DC | PRN
  Filled 2014-07-03: qty 5

## 2014-07-03 MED ORDER — BUPIVACAINE ON-Q PAIN PUMP (FOR ORDER SET NO CHG)
INJECTION | Status: DC
  Filled 2014-07-03: qty 1

## 2014-07-03 MED ORDER — PAROXETINE HCL 20 MG PO TABS
40.0000 mg | ORAL_TABLET | Freq: Two times a day (BID) | ORAL | Status: DC
  Administered 2014-07-03 – 2014-07-05 (×5): 40 mg via ORAL
  Filled 2014-07-03 (×5): qty 2

## 2014-07-03 MED ORDER — MENTHOL 3 MG MT LOZG: 1.0000 | LOZENGE | OROMUCOSAL | Status: DC | PRN

## 2014-07-03 MED ORDER — METOCLOPRAMIDE HCL 5 MG/ML IJ SOLN: 5.0000 mg | Freq: Three times a day (TID) | INTRAMUSCULAR | Status: DC | PRN

## 2014-07-03 MED ORDER — VANCOMYCIN HCL IN DEXTROSE 1-5 GM/200ML-% IV SOLN
INTRAVENOUS | Status: AC
Start: 2014-07-03 — End: 2014-07-03
  Administered 2014-07-03: 1000 mg via INTRAVENOUS
  Filled 2014-07-03: qty 200

## 2014-07-03 MED ORDER — CEFAZOLIN SODIUM-DEXTROSE 2-3 GM-% IV SOLR: 2.0000 g | INTRAVENOUS | Status: DC

## 2014-07-03 MED ORDER — LIDOCAINE HCL (CARDIAC) 20 MG/ML IV SOLN
INTRAVENOUS | Status: AC
  Filled 2014-07-03: qty 5

## 2014-07-03 MED ORDER — LACTATED RINGERS IV SOLN
INTRAVENOUS | Status: DC
  Administered 2014-07-03 (×2): via INTRAVENOUS

## 2014-07-03 MED ORDER — SODIUM CHLORIDE 0.9 % IJ SOLN
INTRAMUSCULAR | Status: AC
  Filled 2014-07-03: qty 10

## 2014-07-03 MED ORDER — LISINOPRIL 20 MG PO TABS
20.0000 mg | ORAL_TABLET | Freq: Every day | ORAL | Status: DC
  Administered 2014-07-04 – 2014-07-05 (×2): 20 mg via ORAL
  Filled 2014-07-03 (×3): qty 1

## 2014-07-03 MED ORDER — METHOCARBAMOL 1000 MG/10ML IJ SOLN
500.0000 mg | INTRAVENOUS | Status: DC
  Filled 2014-07-03: qty 5

## 2014-07-03 MED ORDER — MIDAZOLAM HCL 5 MG/5ML IJ SOLN
INTRAMUSCULAR | Status: DC | PRN
  Administered 2014-07-03: 2 mg via INTRAVENOUS

## 2014-07-03 MED ORDER — BUPIVACAINE-EPINEPHRINE 0.5% -1:200000 IJ SOLN
INTRAMUSCULAR | Status: DC | PRN
  Administered 2014-07-03: 30 mL

## 2014-07-03 MED ORDER — ATENOLOL 50 MG PO TABS
100.0000 mg | ORAL_TABLET | Freq: Every day | ORAL | Status: DC
  Administered 2014-07-03 – 2014-07-05 (×3): 100 mg via ORAL
  Filled 2014-07-03 (×4): qty 2

## 2014-07-03 MED ORDER — TRANEXAMIC ACID 1000 MG/10ML IV SOLN: 1000.0000 mg | Freq: Once | INTRAVENOUS | Status: DC

## 2014-07-03 MED ORDER — BUPIVACAINE-EPINEPHRINE (PF) 0.5% -1:200000 IJ SOLN
INTRAMUSCULAR | Status: AC
  Filled 2014-07-03: qty 30

## 2014-07-03 MED ORDER — INSULIN ASPART 100 UNIT/ML ~~LOC~~ SOLN
0.0000 [IU] | Freq: Three times a day (TID) | SUBCUTANEOUS | Status: DC
  Administered 2014-07-03: 5 [IU] via SUBCUTANEOUS
  Administered 2014-07-04 – 2014-07-05 (×5): 3 [IU] via SUBCUTANEOUS

## 2014-07-03 MED ORDER — ONDANSETRON HCL 4 MG/2ML IJ SOLN: 4.0000 mg | Freq: Four times a day (QID) | INTRAMUSCULAR | Status: DC | PRN

## 2014-07-03 MED ORDER — PHENYLEPHRINE HCL 10 MG/ML IJ SOLN
10.0000 mg | INTRAVENOUS | Status: DC | PRN
  Administered 2014-07-03: 20 ug/min via INTRAVENOUS

## 2014-07-03 MED ORDER — OXYCODONE HCL ER 10 MG PO T12A
10.0000 mg | EXTENDED_RELEASE_TABLET | Freq: Two times a day (BID) | ORAL | Status: DC
  Administered 2014-07-03 – 2014-07-05 (×5): 10 mg via ORAL
  Filled 2014-07-03 (×5): qty 1

## 2014-07-03 MED ORDER — SUCCINYLCHOLINE CHLORIDE 20 MG/ML IJ SOLN
INTRAMUSCULAR | Status: AC
  Filled 2014-07-03: qty 1

## 2014-07-03 MED ORDER — PRAVASTATIN SODIUM 40 MG PO TABS
40.0000 mg | ORAL_TABLET | Freq: Every day | ORAL | Status: DC
  Administered 2014-07-03 – 2014-07-05 (×3): 40 mg via ORAL
  Filled 2014-07-03 (×3): qty 1

## 2014-07-03 MED ORDER — SENNOSIDES-DOCUSATE SODIUM 8.6-50 MG PO TABS: 1.0000 | ORAL_TABLET | Freq: Every evening | ORAL | Status: DC | PRN

## 2014-07-03 MED ORDER — BUPIVACAINE IN DEXTROSE 0.75-8.25 % IT SOLN
INTRATHECAL | Status: DC | PRN
  Administered 2014-07-03: 1.6 mL via INTRATHECAL

## 2014-07-03 MED ORDER — FENTANYL CITRATE (PF) 250 MCG/5ML IJ SOLN
INTRAMUSCULAR | Status: AC
  Filled 2014-07-03: qty 5

## 2014-07-03 MED ORDER — FLEET ENEMA 7-19 GM/118ML RE ENEM: 1.0000 | ENEMA | Freq: Once | RECTAL | Status: AC | PRN

## 2014-07-03 MED ORDER — CELECOXIB 200 MG PO CAPS
200.0000 mg | ORAL_CAPSULE | Freq: Two times a day (BID) | ORAL | Status: DC
  Administered 2014-07-03 – 2014-07-05 (×5): 200 mg via ORAL
  Filled 2014-07-03 (×5): qty 1

## 2014-07-03 MED ORDER — MIDAZOLAM HCL 2 MG/2ML IJ SOLN
INTRAMUSCULAR | Status: AC
  Filled 2014-07-03: qty 2

## 2014-07-03 MED ORDER — PANTOPRAZOLE SODIUM 40 MG PO TBEC
40.0000 mg | DELAYED_RELEASE_TABLET | Freq: Every day | ORAL | Status: DC
  Administered 2014-07-03 – 2014-07-05 (×3): 40 mg via ORAL
  Filled 2014-07-03 (×3): qty 1

## 2014-07-03 MED ORDER — HYDROMORPHONE HCL 1 MG/ML IJ SOLN
1.0000 mg | INTRAMUSCULAR | Status: DC | PRN
  Filled 2014-07-03: qty 1

## 2014-07-03 MED ORDER — AMLODIPINE BESYLATE 5 MG PO TABS
5.0000 mg | ORAL_TABLET | Freq: Every day | ORAL | Status: DC
  Administered 2014-07-04 – 2014-07-05 (×2): 5 mg via ORAL
  Filled 2014-07-03 (×3): qty 1

## 2014-07-03 SURGICAL SUPPLY — 57 items
BANDAGE ESMARK 6X9 LF (GAUZE/BANDAGES/DRESSINGS) ×1 IMPLANT
BLADE SAGITTAL 13X1.27X60 (BLADE) ×2 IMPLANT
BLADE SAGITTAL 13X1.27X60MM (BLADE) ×1
BLADE SAW SGTL 83.5X18.5 (BLADE) ×3 IMPLANT
BLADE SURG 10 STRL SS (BLADE) ×3 IMPLANT
BLOCK CUTTING FEMUR 3 LT MED (MISCELLANEOUS) ×6 IMPLANT
BNDG ESMARK 6X9 LF (GAUZE/BANDAGES/DRESSINGS) ×3
BOWL SMART MIX CTS (DISPOSABLE) ×3 IMPLANT
CAPT KNEE TOTAL 3 ×3 IMPLANT
COVER SURGICAL LIGHT HANDLE (MISCELLANEOUS) ×3 IMPLANT
CUFF TOURNIQUET SINGLE 34IN LL (TOURNIQUET CUFF) ×3 IMPLANT
DRAPE EXTREMITY T 121X128X90 (DRAPE) ×3 IMPLANT
DRAPE IMP U-DRAPE 54X76 (DRAPES) ×3 IMPLANT
DRAPE INCISE IOBAN 66X45 STRL (DRAPES) ×6 IMPLANT
DRAPE PROXIMA HALF (DRAPES) IMPLANT
DRAPE U-SHAPE 47X51 STRL (DRAPES) ×3 IMPLANT
DRSG ADAPTIC 3X8 NADH LF (GAUZE/BANDAGES/DRESSINGS) ×3 IMPLANT
DRSG PAD ABDOMINAL 8X10 ST (GAUZE/BANDAGES/DRESSINGS) ×3 IMPLANT
DURAPREP 26ML APPLICATOR (WOUND CARE) ×6 IMPLANT
ELECT REM PT RETURN 9FT ADLT (ELECTROSURGICAL) ×3
ELECTRODE REM PT RTRN 9FT ADLT (ELECTROSURGICAL) ×1 IMPLANT
GAUZE SPONGE 4X4 12PLY STRL (GAUZE/BANDAGES/DRESSINGS) ×3 IMPLANT
GLOVE BIOGEL M 7.0 STRL (GLOVE) IMPLANT
GLOVE BIOGEL PI IND STRL 7.5 (GLOVE) IMPLANT
GLOVE BIOGEL PI IND STRL 8.5 (GLOVE) ×2 IMPLANT
GLOVE BIOGEL PI INDICATOR 7.5 (GLOVE)
GLOVE BIOGEL PI INDICATOR 8.5 (GLOVE) ×4
GLOVE SURG ORTHO 8.0 STRL STRW (GLOVE) ×6 IMPLANT
GOWN STRL REUS W/ TWL LRG LVL3 (GOWN DISPOSABLE) ×1 IMPLANT
GOWN STRL REUS W/ TWL XL LVL3 (GOWN DISPOSABLE) ×2 IMPLANT
GOWN STRL REUS W/TWL LRG LVL3 (GOWN DISPOSABLE) ×3
GOWN STRL REUS W/TWL XL LVL3 (GOWN DISPOSABLE) ×4
HANDPIECE INTERPULSE COAX TIP (DISPOSABLE) ×2
HOOD PEEL AWAY FACE SHEILD DIS (HOOD) ×9 IMPLANT
KIT BASIN OR (CUSTOM PROCEDURE TRAY) ×3 IMPLANT
KIT ROOM TURNOVER OR (KITS) ×3 IMPLANT
MANIFOLD NEPTUNE II (INSTRUMENTS) ×3 IMPLANT
NEEDLE 22X1 1/2 (OR ONLY) (NEEDLE) ×6 IMPLANT
NS IRRIG 1000ML POUR BTL (IV SOLUTION) ×3 IMPLANT
PACK TOTAL JOINT (CUSTOM PROCEDURE TRAY) ×3 IMPLANT
PACK UNIVERSAL I (CUSTOM PROCEDURE TRAY) ×3 IMPLANT
PAD ARMBOARD 7.5X6 YLW CONV (MISCELLANEOUS) ×6 IMPLANT
PADDING CAST COTTON 6X4 STRL (CAST SUPPLIES) ×3 IMPLANT
SET HNDPC FAN SPRY TIP SCT (DISPOSABLE) ×1 IMPLANT
SPONGE GAUZE 4X4 12PLY STER LF (GAUZE/BANDAGES/DRESSINGS) ×3 IMPLANT
STAPLER VISISTAT 35W (STAPLE) ×3 IMPLANT
SUCTION FRAZIER TIP 10 FR DISP (SUCTIONS) ×3 IMPLANT
SUT BONE WAX W31G (SUTURE) ×3 IMPLANT
SUT VIC AB 0 CTB1 27 (SUTURE) ×6 IMPLANT
SUT VIC AB 1 CT1 27 (SUTURE) ×4
SUT VIC AB 1 CT1 27XBRD ANBCTR (SUTURE) ×2 IMPLANT
SUT VIC AB 2-0 CT1 27 (SUTURE) ×6
SUT VIC AB 2-0 CT1 TAPERPNT 27 (SUTURE) ×2 IMPLANT
SYR 20CC LL (SYRINGE) ×6 IMPLANT
TOWEL OR 17X24 6PK STRL BLUE (TOWEL DISPOSABLE) ×3 IMPLANT
TOWEL OR 17X26 10 PK STRL BLUE (TOWEL DISPOSABLE) ×3 IMPLANT
WATER STERILE IRR 1000ML POUR (IV SOLUTION) ×6 IMPLANT

## 2014-07-03 NOTE — Progress Notes (Signed)
Utilization review completed.  

## 2014-07-03 NOTE — H&P (Signed)
Gloria Rose MRN:  [id number] DOB/SEX:  Oct 20, 1950/female  CHIEF COMPLAINT:  Painful left Knee  HISTORY: Patient is a 64 y.o. female presented with a history of pain in the left knee. Onset of symptoms was gradual starting several years ago with gradually worsening course since that time. Prior procedures on the knee include arthroscopy. Patient has been treated conservatively with over-the-counter NSAIDs and activity modification. Patient currently rates pain in the knee at 10 out of 10 with activity. There is pain at night.  PAST MEDICAL HISTORY: There are no active problems to display for this patient.  Past Medical History  Diagnosis Date  . Hypertension   . Diabetes mellitus without complication     dx 7-8 yrs  type 2  . Depression   . GERD (gastroesophageal reflux disease)   . Cancer     melanoma of toe (big toe on left foot) removed   Past Surgical History  Procedure Laterality Date  . Rtr      RIGHT SHOULDER IN 2013     MEDICATIONS:   Prescriptions prior to admission  Medication Sig Dispense Refill Last Dose  . amLODipine (NORVASC) 5 MG tablet Take 5 mg by mouth daily.     Marland Kitchen aspirin 81 MG chewable tablet Chew 81 mg by mouth daily.     Marland Kitchen atenolol (TENORMIN) 100 MG tablet Take 100 mg by mouth daily.     . hydrochlorothiazide (HYDRODIURIL) 25 MG tablet Take 25 mg by mouth daily.     Marland Kitchen lisinopril (PRINIVIL,ZESTRIL) 20 MG tablet Take 20 mg by mouth daily.     . metFORMIN (GLUCOPHAGE) 500 MG tablet Take 500 mg by mouth 2 (two) times daily with a meal.     . omeprazole (PRILOSEC) 20 MG capsule Take 20 mg by mouth daily.     Marland Kitchen PARoxetine (PAXIL) 40 MG tablet Take 40 mg by mouth 2 (two) times daily.      . pravastatin (PRAVACHOL) 40 MG tablet Take 40 mg by mouth daily.       ALLERGIES:   Allergies  Allergen Reactions  . Penicillins Hives    REVIEW OF SYSTEMS:  A comprehensive review of systems was negative.   FAMILY HISTORY:  History reviewed. No pertinent family  history.  SOCIAL HISTORY:   History  Substance Use Topics  . Smoking status: Never Smoker   . Smokeless tobacco: Not on file  . Alcohol Use: 1.2 oz/week    2 Glasses of wine per week     EXAMINATION:  Vital signs in last 24 hours:    General appearance: alert, cooperative and no distress Lungs: clear to auscultation bilaterally Heart: regular rate and rhythm, S1, S2 normal, no murmur, click, rub or gallop Abdomen: soft, non-tender; bowel sounds normal; no masses,  no organomegaly Extremities: extremities normal, atraumatic, no cyanosis or edema and Homans sign is negative, no sign of DVT Pulses: 2+ and symmetric Skin: Skin color, texture, turgor normal. No rashes or lesions Neurologic: Alert and oriented X 3, normal strength and tone. Normal symmetric reflexes. Normal coordination and gait  Musculoskeletal:  ROM 0-100, Ligaments intact,  Imaging Review Plain radiographs demonstrate severe degenerative joint disease of the left knee. The overall alignment is significant varus. The bone quality appears to be good for age and reported activity level.  Assessment/Plan: Primary osteoarthritis, left knee   The patient history, physical examination and imaging studies are consistent with advanced degenerative joint disease of the left knee. The patient has failed conservative treatment.  The clearance notes were reviewed.  After discussion with the patient it was felt that Total Knee Replacement was indicated. The procedure,  risks, and benefits of total knee arthroplasty were presented and reviewed. The risks including but not limited to aseptic loosening, infection, blood clots, vascular injury, stiffness, patella tracking problems complications among others were discussed. The patient acknowledged the explanation, agreed to proceed with the plan.  Gloria Rose 07/03/2014, 6:42 AM

## 2014-07-03 NOTE — Anesthesia Preprocedure Evaluation (Addendum)
Anesthesia Evaluation  Patient identified by MRN, date of birth, ID band Patient awake    Reviewed: Allergy & Precautions, NPO status , Patient's Chart, lab work & pertinent test results  Airway Mallampati: II   Neck ROM: full    Dental  (+) Dental Advisory Given   Pulmonary neg pulmonary ROS,  breath sounds clear to auscultation        Cardiovascular hypertension, Pt. on medications and Pt. on home beta blockers Rhythm:regular Rate:Normal     Neuro/Psych Depression    GI/Hepatic GERD-  Medicated,  Endo/Other  diabetes, Type 2, Oral Hypoglycemic AgentsMorbid obesity  Renal/GU      Musculoskeletal   Abdominal   Peds  Hematology   Anesthesia Other Findings   Reproductive/Obstetrics                            Anesthesia Physical Anesthesia Plan  ASA: II  Anesthesia Plan: MAC and Spinal   Post-op Pain Management:    Induction: Intravenous  Airway Management Planned: Simple Face Mask  Additional Equipment:   Intra-op Plan:   Post-operative Plan:   Informed Consent: I have reviewed the patients History and Physical, chart, labs and discussed the procedure including the risks, benefits and alternatives for the proposed anesthesia with the patient or authorized representative who has indicated his/her understanding and acceptance.     Plan Discussed with: CRNA, Anesthesiologist and Surgeon  Anesthesia Plan Comments:         Anesthesia Quick Evaluation

## 2014-07-03 NOTE — Plan of Care (Signed)
Problem: Consults Goal: Diagnosis- Total Joint Replacement Primary Total Knee Left     

## 2014-07-03 NOTE — Progress Notes (Signed)
Able to bend r knee and lift buttock off bed/ decreased sensation l toes/ POST SPINAL

## 2014-07-03 NOTE — Anesthesia Procedure Notes (Addendum)
Spinal Patient location during procedure: OR Start time: 07/03/2014 9:02 AM End time: 07/03/2014 9:15 AM Staffing Anesthesiologist: HODIERNE, ADAM Performed by: anesthesiologist  Preanesthetic Checklist Completed: patient identified, site marked, surgical consent, pre-op evaluation, timeout performed, IV checked, risks and benefits discussed and monitors and equipment checked Spinal Block Patient position: sitting Prep: Betadine and site prepped and draped Patient monitoring: heart rate, cardiac monitor, continuous pulse ox and blood pressure Approach: right paramedian Location: L3-4 Injection technique: single-shot Needle Needle type: Pencan  Needle gauge: 24 G Needle length: 10 cm Assessment Sensory level: T6 Additional Notes Pt tolerated the procedure well.  Procedure Name: MAC Date/Time: 07/03/2014 9:15 AM Performed by: Merrilyn Puma B Pre-anesthesia Checklist: Patient identified, Emergency Drugs available and Suction available Patient Re-evaluated:Patient Re-evaluated prior to inductionOxygen Delivery Method: Simple face mask Intubation Type: IV induction Placement Confirmation: positive ETCO2 and breath sounds checked- equal and bilateral Dental Injury: Teeth and Oropharynx as per pre-operative assessment

## 2014-07-03 NOTE — Evaluation (Signed)
Physical Therapy Evaluation Patient Details Name: Gloria Rose MRN: [id number] DOB: Dec 03, 1950 Today's Date: 07/03/2014   History of Present Illness  Patient is a 64 y/o female s/p L TKA. PMH includes HTN, DM, depression and ca.   Clinical Impression  Patient presents with post surgical deficits LLE s/p L TKA impacting mobility. Reviewed precautions and exercises. Tolerated ambulation with Min guard assist for safety. Pt will have 24/7 S at home switching btw spouse and mother in law. Pt plans to d/c tomorrow morning so need to see in AM for gait training and stair training. Will follow to maximize independence and mobility prior to return home.     Follow Up Recommendations Home health PT;Supervision/Assistance - 24 hour    Equipment Recommendations  None recommended by PT    Recommendations for Other Services       Precautions / Restrictions Precautions Precautions: Knee Precaution Booklet Issued: No Precaution Comments: Reviewed no pillow under knee and zero degree knee.  Restrictions Weight Bearing Restrictions: Yes LLE Weight Bearing: Weight bearing as tolerated      Mobility  Bed Mobility Overal bed mobility: Needs Assistance Bed Mobility: Rolling;Sidelying to Sit Rolling: Supervision Sidelying to sit: Supervision;HOB elevated       General bed mobility comments: Use of rails for support. No physical assist needed.  Transfers Overall transfer level: Needs assistance Equipment used: Rolling walker (2 wheeled) Transfers: Sit to/from Stand Sit to Stand: Min guard         General transfer comment: Min guard for safety due to impaired sensation LLE. Unsteady initially. Stood from [employer], from toilet x1. Transferred to chair.   Ambulation/Gait Ambulation/Gait assistance: Min guard Ambulation Distance (Feet): 20 Feet (x2 bouts) Assistive device: Rolling walker (2 wheeled) Gait Pattern/deviations: Step-to pattern;Decreased stance time - left;Decreased step  length - right;Decreased stride length;Narrow base of support;Trunk flexed   Gait velocity interpretation: Below normal speed for age/gender General Gait Details: Short shuffling steps. Cues for RW safety/management.   Stairs            Wheelchair Mobility    Modified Rankin (Stroke Patients Only)       Balance Overall balance assessment: Needs assistance Sitting-balance support: Feet supported;No upper extremity supported Sitting balance-Leahy Scale: Good     Standing balance support: During functional activity Standing balance-Leahy Scale: Fair                               Pertinent Vitals/Pain Pain Assessment: No/denies pain    Home Living Family/patient expects to be discharged to:: Private residence Living Arrangements: Spouse/significant other;Children;Other relatives Available Help at Discharge: Family;Available 24 hours/day Type of Home: House Home Access: Stairs to enter Entrance Stairs-Rails: Right;Left;Can reach both Entrance Stairs-Number of Steps: 2 Home Layout: One level Home Equipment: Walker - 2 wheels;Bedside commode      Prior Function Level of Independence: Independent         Comments: Pt reports her spouse and mother in law will be taking turns helping her at home.      Hand Dominance        Extremity/Trunk Assessment   Upper Extremity Assessment: Defer to OT evaluation           Lower Extremity Assessment: LLE deficits/detail   LLE Deficits / Details: Limited AROM/strengt secondary to pain/post surgery.      Communication   Communication: No difficulties  Cognition Arousal/Alertness: Awake/alert Behavior During Therapy: WFL for tasks  assessed/performed Overall Cognitive Status: Within Functional Limits for tasks assessed                      General Comments General comments (skin integrity, edema, etc.): Pt's spouse and mother in law present in room during session.    Exercises Total Joint  Exercises Ankle Circles/Pumps: Both;10 reps;Supine Quad Sets: Both;10 reps;Supine Gluteal Sets: Both;10 reps;Supine      Assessment/Plan    PT Assessment Patient needs continued PT services  PT Diagnosis Difficulty walking;Generalized weakness   PT Problem List Decreased strength;Decreased range of motion;Impaired sensation;Decreased balance;Decreased mobility;Decreased activity tolerance  PT Treatment Interventions Balance training;Gait training;Functional mobility training;Therapeutic activities;Therapeutic exercise;Patient/family education;Stair training   PT Goals (Current goals can be found in the Care Plan section) Acute Rehab PT Goals Patient Stated Goal: to be able to walk again without pain PT Goal Formulation: With patient Time For Goal Achievement: 07/17/14 Potential to Achieve Goals: Good    Frequency 7X/week   Barriers to discharge        Co-evaluation               End of Session Equipment Utilized During Treatment: Gait belt Activity Tolerance: Patient tolerated treatment well Patient left: in chair;with call bell/phone within reach;with family/visitor present Nurse Communication: Mobility status         Time: 2446-2863 PT Time Calculation (min) (ACUTE ONLY): 23 min   Charges:   PT Evaluation $Initial PT Evaluation Tier I: 1 Procedure PT Treatments $Therapeutic Activity: 8-22 mins   PT G Codes:        Hiromi Knodel A Cayenne Breault 07/03/2014, 3:14 PM Wray Kearns, [place], DPT [phone]

## 2014-07-03 NOTE — Progress Notes (Signed)
Allergy to PCN has been changed to breathing problems.Per pharmacy. Dr Marcie Bal notified.

## 2014-07-03 NOTE — Progress Notes (Signed)
Orthopedic Tech Progress Note Patient Details:  Gloria Rose December 08, 1950 [id number] CPM applied to LLE with appropriate settings. OHF applied to bed. Footsie roll supplied. CPM Left Knee CPM Left Knee: On Left Knee Flexion (Degrees): 90 Left Knee Extension (Degrees): 0   Asia R Thompson 07/03/2014, 12:23 PM

## 2014-07-03 NOTE — Transfer of Care (Signed)
Immediate Anesthesia Transfer of Care Note  Patient: Gloria Rose  Procedure(s) Performed: Procedure(s): LEFT TOTAL KNEE ARTHROPLASTY (Left)  Patient Location: PACU  Anesthesia Type:Spinal  Level of Consciousness: awake, alert  and oriented  Airway & Oxygen Therapy: Patient Spontanous Breathing  Post-op Assessment: Report given to RN and Post -op Vital signs reviewed and stable  Post vital signs: Reviewed and stable  Last Vitals:  Filed Vitals:   07/03/14 0657  BP: 141/52  Pulse: 57  Temp: 36.7 C  Resp: 18   HR 63, BP 98/53, RR 14, Sats 00% on RA Complications: No apparent anesthesia complications

## 2014-07-03 NOTE — Op Note (Signed)
TOTAL KNEE REPLACEMENT OPERATIVE NOTE:  07/03/2014  2:18 PM  PATIENT:  Gloria Rose  64 y.o. female  PRE-OPERATIVE DIAGNOSIS:  Primary osteoarthritis left knee  POST-OPERATIVE DIAGNOSIS:  Primary osteoarthritis left knee  PROCEDURE:  Procedure(s): LEFT TOTAL KNEE ARTHROPLASTY  SURGEON:  Surgeon(s): Vickey Huger, MD  PHYSICIAN ASSISTANT: Carlynn Spry, [facility]  ANESTHESIA:   spinal  DRAINS: Hemovac  SPECIMEN: None  COUNTS:  Correct  TOURNIQUET:   Total Tourniquet Time Documented: Thigh (Left) - 67 minutes Total: Thigh (Left) - 67 minutes   DICTATION:  Indication for procedure:    The patient is a 64 y.o. female who has failed conservative treatment for Primary osteoarthritis left knee.  Informed consent was obtained prior to anesthesia. The risks versus benefits of the operation were explain and in a way the patient can, and did, understand.   On the implant demand matching protocol, this patient scored 10.  Therefore, this patient was not receive a polyethylene insert with vitamin E which is a high demand implant.  Description of procedure:     The patient was taken to the operating room and placed under anesthesia.  The patient was positioned in the usual fashion taking care that all body parts were adequately padded and/or protected.  I foley catheter was not placed.  A tourniquet was applied and the leg prepped and draped in the usual sterile fashion.  The extremity was exsanguinated with the esmarch and tourniquet inflated to 350 mmHg.  Pre-operative range of motion was normal.  The knee was in 5 degree of mild varus.  A midline incision approximately 6-7 inches long was made with a #10 blade.  A new blade was used to make a parapatellar arthrotomy going 2-3 cm into the quadriceps tendon, over the patella, and alongside the medial aspect of the patellar tendon.  A synovectomy was then performed with the #10 blade and forceps. I then elevated the deep MCL off the medial  tibial metaphysis subperiosteally around to the semimembranosus attachment.    I everted the patella and used calipers to measure patellar thickness.  I used the reamer to ream down to appropriate thickness to recreate the native thickness.  I then removed excess bone with the rongeur and sagittal saw.  I used the appropriately sized template and drilled the three lug holes.  I then put the trial in place and measured the thickness with the calipers to ensure recreation of the native thickness.  The trial was then removed and the patella subluxed and the knee brought into flexion.  A homan retractor was place to retract and protect the patella and lateral structures.  A Z-retractor was place medially to protect the medial structures.  The extra-medullary alignment system was used to make cut the tibial articular surface perpendicular to the anamotic axis of the tibia and in 3 degrees of posterior slope.  The cut surface and alignment jig was removed.  I then used the intramedullary alignment guide to make a 6 valgus cut on the distal femur.  I then marked out the epicondylar axis on the distal femur.  The posterior condylar axis measured 3 degrees.  I then used the anterior referencing sizer and measured the femur to be a size 3.  The 4-In-1 cutting block was screwed into place in external rotation matching the posterior condylar angle, making our cuts perpendicular to the epicondylar axis.  Anterior, posterior and chamfer cuts were made with the sagittal saw.  The cutting block and cut pieces  were removed.  A lamina spreader was placed in 90 degrees of flexion.  The ACL, PCL, menisci, and posterior condylar osteophytes were removed.  A 12 mm spacer blocked was found to offer good flexion and extension gap balance after minimal in degree releasing.   The scoop retractor was then placed and the femoral finishing block was pinned in place.  The small sagittal saw was used as well as the lug drill to finish the  femur.  The block and cut surfaces were removed and the medullary canal hole filled with autograft bone from the cut pieces.  The tibia was delivered forward in deep flexion and external rotation.  A size 3 tray was selected and pinned into place centered on the medial 1/3 of the tibial tubercle.  The reamer and keel was used to prepare the tibia through the tray.    I then trialed with the size 3 femur, size 3 tibia, a 12 mm insert and the 2 patella.  I had excellent flexion/extension gap balance, excellent patella tracking.  Flexion was full and beyond 120 degrees; extension was zero.  These components were chosen and the staff opened them to me on the back table while the knee was lavaged copiously and the cement mixed.  The soft tissue was infiltrated with 60cc of exparel 1.3% through a 21 gauge needle.  I cemented in the components and removed all excess cement.  The polyethylene tibial component was snapped into place and the knee placed in extension while cement was hardening.  The capsule was infilltrated with 30cc of .25% Marcaine with epinephrine.  A hemovac was place in the joint exiting superolaterally.  A pain pump was place superomedially superficial to the arthrotomy.  Once the cement was hard, the tourniquet was let down.  Hemostasis was obtained.  The arthrotomy was closed with figure-8 #1 vicryl sutures.  The deep soft tissues were closed with #0 vicryls and the subcuticular layer closed with a running #2-0 vicryl.  The skin was reapproximated and closed with skin staples.  The wound was dressed with xeroform, 4 x4's, 2 ABD sponges, a single layer of webril and a TED stocking.   The patient was then awakened, extubated, and taken to the recovery room in stable condition.  BLOOD LOSS:  300cc DRAINS: 1 hemovac, 1 pain catheter COMPLICATIONS:  None.  PLAN OF CARE: Admit to inpatient   PATIENT DISPOSITION:  PACU - hemodynamically stable.   Delay start of Pharmacological VTE agent  (>24hrs) due to surgical blood loss or risk of bleeding:  not applicable  Please fax a copy of this op note to my office at [phone] (please only include page 1 and 2 of the Case Information op note)

## 2014-07-04 ENCOUNTER — Encounter (HOSPITAL_COMMUNITY): Payer: Self-pay | Admitting: Orthopedic Surgery

## 2014-07-04 LAB — BASIC METABOLIC PANEL
ANION GAP: 4 — AB (ref 5–15)
BUN: 9 mg/dL (ref 6–20)
CHLORIDE: 100 mmol/L — AB (ref 101–111)
CO2: 29 mmol/L (ref 22–32)
Calcium: 8.4 mg/dL — ABNORMAL LOW (ref 8.9–10.3)
Creatinine, Ser: 0.78 mg/dL (ref 0.44–1.00)
GFR calc non Af Amer: >60 mL/min (ref >60)
Glucose, Bld: 176 mg/dL — ABNORMAL HIGH (ref 65–99)
Potassium: 4 mmol/L (ref 3.5–5.1)
SODIUM: 133 mmol/L — AB (ref 135–145)

## 2014-07-04 LAB — CBC
HEMATOCRIT: 37.3 % (ref 36.0–46.0)
Hemoglobin: 12.2 g/dL (ref 12.0–15.0)
MCH: 26.9 pg (ref 26.0–34.0)
MCHC: 32.7 g/dL (ref 30.0–36.0)
MCV: 82.2 fL (ref 78.0–100.0)
PLATELETS: 249 10*3/uL (ref 150–400)
RBC: 4.54 MIL/uL (ref 3.87–5.11)
RDW: 12.9 % (ref 11.5–15.5)
WBC: 11.4 10*3/uL — AB (ref 4.0–10.5)

## 2014-07-04 LAB — GLUCOSE, CAPILLARY
GLUCOSE-CAPILLARY: 159 mg/dL — AB (ref 65–99)
GLUCOSE-CAPILLARY: 160 mg/dL — AB (ref 65–99)
GLUCOSE-CAPILLARY: 170 mg/dL — AB (ref 65–99)
Glucose-Capillary: 188 mg/dL — ABNORMAL HIGH (ref 65–99)

## 2014-07-04 LAB — HEMOGLOBIN A1C
Hgb A1c MFr Bld: 6.9 % — ABNORMAL HIGH (ref 4.8–5.6)
MEAN PLASMA GLUCOSE: 151 mg/dL

## 2014-07-04 MED ORDER — OXYCODONE HCL 5 MG PO TABS: 5.0000 mg | ORAL_TABLET | ORAL | Status: DC | PRN

## 2014-07-04 MED ORDER — METHOCARBAMOL 500 MG PO TABS: 500.0000 mg | ORAL_TABLET | Freq: Four times a day (QID) | ORAL | Status: DC | PRN

## 2014-07-04 MED ORDER — ENOXAPARIN SODIUM 40 MG/0.4ML ~~LOC~~ SOLN: 40.0000 mg | SUBCUTANEOUS | Status: DC

## 2014-07-04 MED ORDER — OXYCODONE HCL ER 10 MG PO T12A
10.0000 mg | EXTENDED_RELEASE_TABLET | Freq: Two times a day (BID) | ORAL | Status: DC
Start: 2014-07-04 — End: 2016-04-23

## 2014-07-04 MED ORDER — CELECOXIB 200 MG PO CAPS: 200.0000 mg | ORAL_CAPSULE | Freq: Two times a day (BID) | ORAL | Status: DC

## 2014-07-04 NOTE — Progress Notes (Signed)
Occupational Therapy Evaluation Patient Details Name: Gloria Rose MRN: [id number] DOB: 03-Sep-1950 Today's Date: 07/04/2014    History of Present Illness Patient is a 64 y/o female s/p L TKA. PMH includes HTN, DM, depression and ca.    Clinical Impression   Pt making good progress. Completed all education regarding functional transfers and ADL with use of DME and AE. Pt ready to D/C from OT standpoint when medically stable. OT signing off. Thanks    Follow Up Recommendations  No OT follow up;Supervision - Intermittent    Equipment Recommendations  None recommended by OT    Recommendations for Other Services       Precautions / Restrictions Precautions Precautions: Knee Precaution Comments: Reviewed no pillow under knee and zero degree knee.  Restrictions LLE Weight Bearing: Weight bearing as tolerated      Mobility Bed Mobility Overal bed mobility: Needs Assistance     Sidelying to sit: Min assist       General bed mobility comments: Pt up in chair. Educated on technique  Transfers Overall transfer level: Needs assistance Equipment used: Rolling walker (2 wheeled) Transfers: Risk manager Sit to Stand: Supervision Stand pivot transfers: Supervision       General transfer comment: good carry over from earlier PT session    Balance Overall balance assessment: Needs assistance   Sitting balance-Leahy Scale: Good     Standing balance support: During functional activity Standing balance-Leahy Scale: Fair                              ADL Overall ADL's : Needs assistance/impaired     Grooming: Modified independent   Upper Body Bathing: Set up   Lower Body Bathing: Minimal assistance   Upper Body Dressing : Set up   Lower Body Dressing: Minimal assistance   Toilet Transfer: Supervision/safety;Ambulation;BSC;RW   Toileting- Clothing Manipulation and Hygiene: Supervision/safety;Sit to/from stand   Tub/ Shower Transfer:  Walk-in shower;Min guard;Cueing for safety;Cueing for sequencing;Ambulation;3 in 1;Rolling walker   Functional mobility during ADLs: Supervision/safety;Rolling walker;Cueing for sequencing General ADL Comments: Completed education on shower transfer techniques and compensatory techniques for ADL. Discussed availability of AE and home safety/reducing risk o falls. Pt verbalized understanding.   Given handout on shower transfers.                      Pertinent Vitals/Pain Pain Assessment: 0-10 Pain Score: 8  Pain Location: L knee Pain Descriptors / Indicators: Aching Pain Intervention(s): Limited activity within patient's tolerance;Ice applied;Monitored during session     Hand Dominance     Extremity/Trunk Assessment Upper Extremity Assessment Upper Extremity Assessment: Overall WFL for tasks assessed   Lower Extremity Assessment Lower Extremity Assessment: Defer to PT evaluation   Cervical / Trunk Assessment Cervical / Trunk Assessment: Normal   Communication Communication Communication: No difficulties   Cognition Arousal/Alertness: Awake/alert Behavior During Therapy: WFL for tasks assessed/performed Overall Cognitive Status: Within Functional Limits for tasks assessed                     General Comments   Pt very appreciative of help/information    Exercises       Shoulder Instructions      Home Living Family/patient expects to be discharged to:: Private residence Living Arrangements: Spouse/significant other;Children;Other relatives Available Help at Discharge: Family;Available 24 hours/day Type of Home: House Home Access: Stairs to enter [employer] of Steps: 2  Entrance Stairs-Rails: Right;Left;Can reach both Home Layout: One level     Bathroom Shower/Tub: Walk-in shower;Door   [employer] Toilet: Standard Bathroom Accessibility: Yes How Accessible: Accessible via walker Home Equipment: Walker - 2 wheels;Bedside commode           Prior Functioning/Environment Level of Independence: Independent             OT Diagnosis: Generalized weakness;Acute pain   OT Problem List: Decreased strength;Decreased range of motion;Decreased activity tolerance;Decreased knowledge of use of DME or AE;Decreased knowledge of precautions;Pain   OT Treatment/Interventions:      OT Goals(Current goals can be found in the care plan section) Acute Rehab OT Goals Patient Stated Goal: to be able to walk again without pain OT Goal Formulation: All assessment and education complete, DC therapy  OT Frequency:     Barriers to D/C:            Co-evaluation              End of Session Equipment Utilized During Treatment: Gait belt;Rolling walker CPM Left Knee CPM Left Knee: Off Nurse Communication: Mobility status  Activity Tolerance: Patient tolerated treatment well Patient left: in chair;with call bell/phone within reach   Time: 0835-0920 OT Time Calculation (min): 45 min Charges:  OT General Charges $OT Visit: 1 Procedure OT Evaluation $Initial OT Evaluation Tier I: 1 Procedure OT Treatments $Self Care/Home Management : 23-37 mins G-Codes:    Lalana Wachter,HILLARY 29-Jul-2014, 9:28 AM   Maurie Boettcher, OTR/L  [phone] 07-29-14

## 2014-07-04 NOTE — Progress Notes (Signed)
Physical Therapy Treatment Patient Details Name: KARN DERK MRN: [id number] DOB: April 08, 1950 Today's Date: 07/04/2014    History of Present Illness Patient is a 64 y/o female s/p L TKA. PMH includes HTN, DM, depression and ca.     PT Comments    Patient able to increase ambulation but unable to complete steps. Patient continues to have increased instability and decreased sensation of LLE and due to increased anxiety regarding these, she was unable to complete stair training. Patient is unsafe to DC home today and will need another day of therapy to work on stability and strengthening of LLE and completion of stair training.   Follow Up Recommendations  Home health PT;Supervision/Assistance - 24 hour     Equipment Recommendations  None recommended by PT    Recommendations for Other Services       Precautions / Restrictions Precautions Precautions: Knee Restrictions LLE Weight Bearing: Weight bearing as tolerated    Mobility  Bed Mobility Overal bed mobility: Needs Assistance   Rolling: Supervision Sidelying to sit: Min assist       General bed mobility comments: use of blue leg lifter  Transfers Overall transfer level: Needs assistance Equipment used: Rolling walker (2 wheeled)   Sit to Stand: Supervision            Ambulation/Gait Ambulation/Gait assistance: Supervision Ambulation Distance (Feet): 70 Feet Assistive device: Rolling walker (2 wheeled) Gait Pattern/deviations: Step-to pattern;Decreased step length - right;Decreased stance time - left   Gait velocity interpretation: Below normal speed for age/gender General Gait Details: Cues for RW safety/management. Limited due tofatigue. Unable to work on step through due to LLE buckling   Stairs Stairs: Yes       General stair comments: Attempted steps but patient with increased anxiety due to instability of LLE with increased weight. Unable to complete  Wheelchair Mobility    Modified Rankin  (Stroke Patients Only)       Balance                                    Cognition Arousal/Alertness: Awake/alert Behavior During Therapy: WFL for tasks assessed/performed Overall Cognitive Status: Within Functional Limits for tasks assessed                      Exercises Total Joint Exercises Quad Sets: 10 reps;Supine;Left Heel Slides: AAROM;Left;10 reps Straight Leg Raises: AAROM;Left;10 reps Long Arc Quad: AAROM;Left;10 reps    General Comments        Pertinent Vitals/Pain Pain Score: 4  Pain Location: L knee Pain Descriptors / Indicators: Aching;Sore Pain Intervention(s): Monitored during session    Home Living                      Prior Function            PT Goals (current goals can now be found in the care plan section) Progress towards PT goals: Progressing toward goals    Frequency  7X/week    PT Plan Current plan remains appropriate    Co-evaluation             End of Session Equipment Utilized During Treatment: Gait belt Activity Tolerance: Patient tolerated treatment well Patient left: in chair;with call bell/phone within reach     Time: 1314-1356 PT Time Calculation (min) (ACUTE ONLY): 42 min  Charges:  $Gait Training: 23-37 mins $Therapeutic Exercise: 8-22  mins                    G Codes:      Jacqualyn Posey 07/04/2014, 2:17 PM 07/04/2014 Jacqualyn Posey PTA [phone] pager [phone] office

## 2014-07-04 NOTE — Progress Notes (Signed)
SPORTS MEDICINE AND JOINT REPLACEMENT  Lara Mulch, MD   Carlynn Spry, PA-C [place], [place], [place]  [address]                             [phone]   PROGRESS NOTE  Subjective:  negative for Chest Pain  negative for Shortness of Breath  negative for Nausea/Vomiting   negative for Calf Pain  negative for Bowel Movement   Tolerating Diet: yes         Patient reports pain as 6 on 0-10 scale.    Objective: Vital signs in last 24 hours:   Patient Vitals for the past 24 hrs:  BP Temp Temp src Pulse Resp SpO2  07/04/14 1012 118/81 mmHg - - - - -  07/04/14 0557 (!) 120/58 mmHg 98.2 F (36.8 C) - 70 16 94 %  07/03/14 2130 (!) 150/57 mmHg 98.9 F (37.2 C) Oral 85 16 94 %  07/03/14 1249 (!) 99/44 mmHg 97.9 F (36.6 C) - (!) 56 15 99 %  07/03/14 1200 - 97.9 F (36.6 C) - (!) 58 10 94 %  07/03/14 1158 - - - 60 - -    @flow {1959:LAST@   Intake/Output from previous day:   06/27 0701 - 06/28 0700 In: 2240 [P.O.:240; I.V.:2000] Out: 875 [Urine:825]   Intake/Output this shift:   06/28 0701 - 06/28 1900 In: 1140 [P.O.:1140] Out: -    Intake/Output      06/27 0701 - 06/28 0700 06/28 0701 - 06/29 0700   P.O. 240 1140   I.V. (mL/kg) 2000 (21.4)    Total Intake(mL/kg) 2240 (24) 1140 (12.2)   Urine (mL/kg/hr) 825 (0.4)    Blood 50 (0)    Total Output 875     Net +1365 +1140        Urine Occurrence  1 x      LABORATORY DATA:  Recent Labs  07/03/14 1410 07/04/14 0730  WBC 7.3 11.4*  HGB 11.8* 12.2  HCT 35.7* 37.3  PLT 207 249    Recent Labs  07/03/14 1410 07/04/14 0730  NA  --  133*  K  --  4.0  CL  --  100*  CO2  --  29  BUN  --  9  CREATININE 0.80 0.78  GLUCOSE  --  176*  CALCIUM  --  8.4*   Lab Results  Component Value Date   INR 1.12 06/22/2014    Examination:  General appearance: alert, cooperative and no distress Extremities: Homans sign is negative, no sign of DVT  Wound Exam: clean, dry, intact   Drainage:  None: wound  tissue dry  Motor Exam: EHL and FHL Intact  Sensory Exam: Deep Peroneal normal   Assessment:    1 Day Post-Op  Procedure(s) (LRB): LEFT TOTAL KNEE ARTHROPLASTY (Left)  ADDITIONAL DIAGNOSIS:  Active Problems:   S/P total knee arthroplasty  Acute Blood Loss Anemia   Plan: Physical Therapy as ordered Weight Bearing as Tolerated (WBAT)  DVT Prophylaxis:  Lovenox  DISCHARGE PLAN: Home  DISCHARGE NEEDS: HHPT, CPM, Walker and 3-in-1 comode seat         Gloria Rose 07/04/2014, 11:56 AM

## 2014-07-04 NOTE — Discharge Instructions (Signed)
INSTRUCTIONS AFTER JOINT REPLACEMENT   o Remove items at home which could result in a fall. This includes throw rugs or furniture in walking pathways o ICE to the affected joint every three hours while awake for 30 minutes at a time, for at least the first 3-5 days, and then as needed for pain and swelling.  Continue to use ice for pain and swelling. You may notice swelling that will progress down to the foot and ankle.  This is normal after surgery.  Elevate your leg when you are not up walking on it.   o Continue to use the breathing machine you got in the hospital (incentive spirometer) which will help keep your temperature down.  It is common for your temperature to cycle up and down following surgery, especially at night when you are not up moving around and exerting yourself.  The breathing machine keeps your lungs expanded and your temperature down.   DIET:  As you were doing prior to hospitalization, we recommend a well-balanced diet.  DRESSING / WOUND CARE / SHOWERING  May change dressing on wednesday  ACTIVITY  o Increase activity slowly as tolerated, but follow the weight bearing instructions below.   o No driving for 6 weeks or until further direction given by your physician.  You cannot drive while taking narcotics.  o No lifting or carrying greater than 10 lbs. until further directed by your surgeon. o Avoid periods of inactivity such as sitting longer than an hour when not asleep. This helps prevent blood clots.  o You may return to work once you are authorized by your doctor.     WEIGHT BEARING   Weight bearing as tolerated with assist device (walker, cane, etc) as directed, use it as long as suggested by your surgeon or therapist, typically at least 4-6 weeks.   EXERCISES  Results after joint replacement surgery are often greatly improved when you follow the exercise, range of motion and muscle strengthening exercises prescribed by your doctor. Safety measures are also  important to protect the joint from further injury. Any time any of these exercises cause you to have increased pain or swelling, decrease what you are doing until you are comfortable again and then slowly increase them. If you have problems or questions, call your caregiver or physical therapist for advice.   Rehabilitation is important following a joint replacement. After just a few days of immobilization, the muscles of the leg can become weakened and shrink (atrophy).  These exercises are designed to build up the tone and strength of the thigh and leg muscles and to improve motion. Often times heat used for twenty to thirty minutes before working out will loosen up your tissues and help with improving the range of motion but do not use heat for the first two weeks following surgery (sometimes heat can increase post-operative swelling).   These exercises can be done on a training (exercise) mat, on the floor, on a table or on a bed. Use whatever works the best and is most comfortable for you.    Use music or television while you are exercising so that the exercises are a pleasant break in your day. This will make your life better with the exercises acting as a break in your routine that you can look forward to.   Perform all exercises about fifteen times, three times per day or as directed.  You should exercise both the operative leg and the other leg as well.  Exercises include:  Quad Sets - Tighten up the muscle on the front of the thigh (Quad) and hold for 5-10 seconds.   °• Straight Leg Raises - With your knee straight (if you were given a brace, keep it on), lift the leg to 60 degrees, hold for 3 seconds, and slowly lower the leg.  Perform this exercise against resistance later as your leg gets stronger.  °• Leg Slides: Lying on your back, slowly slide your foot toward your buttocks, bending your knee up off the floor (only go as far as is comfortable). Then slowly slide your foot back down until  your leg is flat on the floor again.  °• Angel Wings: Lying on your back spread your legs to the side as far apart as you can without causing discomfort.  °• Hamstring Strength:  Lying on your back, push your heel against the floor with your leg straight by tightening up the muscles of your buttocks.  Repeat, but this time bend your knee to a comfortable angle, and push your heel against the floor.  You may put a pillow under the heel to make it more comfortable if necessary.  ° °A rehabilitation program following joint replacement surgery can speed recovery and prevent re-injury in the future due to weakened muscles. Contact your doctor or a physical therapist for more information on knee rehabilitation.  ° ° °CONSTIPATION ° °Constipation is defined medically as fewer than three stools per week and severe constipation as less than one stool per week.  Even if you have a regular bowel pattern at home, your normal regimen is likely to be disrupted due to multiple reasons following surgery.  Combination of anesthesia, postoperative narcotics, change in appetite and fluid intake all can affect your bowels.  ° °YOU MUST use at least one of the following options; they are listed in order of increasing strength to get the job done.  They are all available over the counter, and you may need to use some, POSSIBLY even all of these options:   ° °Drink plenty of fluids (prune juice may be helpful) and high fiber foods °Colace 100 mg by mouth twice a day  °Senokot for constipation as directed and as needed Dulcolax (bisacodyl), take with full glass of water  °Miralax (polyethylene glycol) once or twice a day as needed. ° °If you have tried all these things and are unable to have a bowel movement in the first 3-4 days after surgery call either your surgeon or your primary doctor.   ° °If you experience loose stools or diarrhea, hold the medications until you stool forms back up.  If your symptoms do not get better within 1 week  or if they get worse, check with your doctor.  If you experience "the worst abdominal pain ever" or develop nausea or vomiting, please contact the office immediately for further recommendations for treatment. ° ° °ITCHING:  If you experience itching with your medications, try taking only a single pain pill, or even half a pain pill at a time.  You can also use Benadryl over the counter for itching or also to help with sleep.  ° °TED HOSE STOCKINGS:  Use stockings on both legs until for at least 2 weeks or as directed by physician office. They may be removed at night for sleeping. ° °MEDICATIONS:  See your medication summary on the “After Visit Summary” that nursing will review with you.  You may have some home medications which will be placed on hold until   you complete the course of blood thinner medication.  It is important for you to complete the blood thinner medication as prescribed. ° °PRECAUTIONS:  If you experience chest pain or shortness of breath - call 911 immediately for transfer to the hospital emergency department.  ° °If you develop a fever greater that 101 F, purulent drainage from wound, increased redness or drainage from wound, foul odor from the wound/dressing, or calf pain - CONTACT YOUR SURGEON.   °                                                °FOLLOW-UP APPOINTMENTS:  If you do not already have a post-op appointment, please call the office for an appointment to be seen by your surgeon.  Guidelines for how soon to be seen are listed in your “After Visit Summary”, but are typically between 1-4 weeks after surgery. ° °OTHER INSTRUCTIONS:  ° °Knee Replacement:  Do not place pillow under knee, focus on keeping the knee straight while resting. CPM instructions: 0-90 degrees, 2 hours in the morning, 2 hours in the afternoon, and 2 hours in the evening. Place foam block, curve side up under heel at all times except when in CPM or when walking.  DO NOT modify, tear, cut, or change the foam block in any  way. ° °MAKE SURE YOU:  °• Understand these instructions.  °• Get help right away if you are not doing well or get worse.  ° ° °Thank you for letting us be a part of your medical care team.  It is a privilege we respect greatly.  We hope these instructions will help you stay on track for a fast and full recovery!  ° °

## 2014-07-04 NOTE — Anesthesia Postprocedure Evaluation (Signed)
  Anesthesia Post-op Note  Patient: Gloria Rose  Procedure(s) Performed: Procedure(s): LEFT TOTAL KNEE ARTHROPLASTY (Left)  Patient Location: PACU  Anesthesia Type:Spinal  Level of Consciousness: awake, alert  and oriented  Airway and Oxygen Therapy: Patient Spontanous Breathing  Post-op Pain: none  Post-op Assessment: Post-op Vital signs reviewed, Patient's Cardiovascular Status Stable and Respiratory Function Stable LLE Motor Response: Purposeful movement       L Sensory Level: S1-Sole of foot, small toes R Sensory Level: S1-Sole of foot, small toes  Post-op Vital Signs: Reviewed and stable  Last Vitals:  Filed Vitals:   07/04/14 1012  BP: 118/81  Pulse:   Temp:   Resp:     Complications: No apparent anesthesia complications

## 2014-07-04 NOTE — Progress Notes (Signed)
Orthopedic Tech Progress Note Patient Details:  Gloria Rose 09-06-1950 [id number] Patient was placed in CPM around 53. Just came off, now sitting in chair. CPM Left Knee CPM Left Knee: Off Left Knee Flexion (Degrees): 90 Left Knee Extension (Degrees): 0   Asia R Thompson 07/04/2014, 2:57 PM

## 2014-07-04 NOTE — Progress Notes (Signed)
Physical Therapy Treatment Patient Details Name: Gloria Rose MRN: [id number] DOB: 06/05/1950 Today's Date: 07/04/2014    History of Present Illness Patient is a 64 y/o female s/p L TKA. PMH includes HTN, DM, depression and ca.     PT Comments    Patient progressing slowly this AM due to pain and soreness in L quad with weightbearing. Unable to attempt steps this AM. Will attempt in second session this afternoon and attempt to increase ambulation to ensure patient is safe to DC home later today.   Follow Up Recommendations  Home health PT;Supervision/Assistance - 24 hour     Equipment Recommendations  None recommended by PT    Recommendations for Other Services       Precautions / Restrictions Precautions Precautions: Knee Precaution Comments: Reviewed no pillow under knee and zero degree knee.  Restrictions LLE Weight Bearing: Weight bearing as tolerated    Mobility  Bed Mobility Overal bed mobility: Needs Assistance     Sidelying to sit: Min assist       General bed mobility comments: Min A for LLE out of bed. Cues for positioning  Transfers Overall transfer level: Needs assistance Equipment used: Rolling walker (2 wheeled)   Sit to Stand: Min guard         General transfer comment: Cues for safe hand placement. Attemped x2 with cues for positioning prior to standing.   Ambulation/Gait Ambulation/Gait assistance: Min guard Ambulation Distance (Feet): 30 Feet Assistive device: Rolling walker (2 wheeled) Gait Pattern/deviations: Step-to pattern;Decreased stance time - left;Decreased step length - right Gait velocity: Guarded Gait velocity interpretation: Below normal speed for age/gender General Gait Details: Short shuffling steps. Cues for RW safety/management. Limited due to pain in LLE this session   Stairs            Wheelchair Mobility    Modified Rankin (Stroke Patients Only)       Balance                                     Cognition Arousal/Alertness: Awake/alert Behavior During Therapy: WFL for tasks assessed/performed Overall Cognitive Status: Within Functional Limits for tasks assessed                      Exercises Total Joint Exercises Quad Sets: Both;10 reps;Supine Heel Slides: AAROM;Left;10 reps Straight Leg Raises: AAROM;Left;10 reps Long Arc Quad: AAROM;Left;10 reps Goniometric ROM: 42 degrees AAROM knee flexion in long sitting    General Comments        Pertinent Vitals/Pain Pain Assessment: 0-10 Pain Score: 5  Pain Descriptors / Indicators: Sore Pain Intervention(s): Monitored during session    Home Living                      Prior Function            PT Goals (current goals can now be found in the care plan section) Progress towards PT goals: Progressing toward goals    Frequency  7X/week    PT Plan Current plan remains appropriate    Co-evaluation             End of Session Equipment Utilized During Treatment: Gait belt Activity Tolerance: Patient limited by pain Patient left: in chair;with call bell/phone within reach     Time: 0746-0825 PT Time Calculation (min) (ACUTE ONLY): 39 min  Charges:  $Gait Training:  23-37 mins $Therapeutic Exercise: 8-22 mins                    G Codes:      Jacqualyn Posey 07/04/2014, 8:32 AM  07/04/2014 Jacqualyn Posey PTA [phone] pager [phone] office

## 2014-07-04 NOTE — Care Management Note (Signed)
Case Management Note  Patient Details  Name: CHEZNEY HUETHER MRN: [id number] Date of Birth: 06-20-1950  Subjective/Objective:             S/p left total knee arthroplasty       Action/Plan: Set up with [facility] for HHPT by MD office. Spoke with patient, no change in discharge plan. [employer] delivered CPM, rolling walker and 3N1 to patient's home. Patient sates that she will have family available to assist her after discharge.   Expected Discharge Date:                  Expected Discharge Plan:  [place]  In-House Referral:  NA  Discharge planning Services  CM Consult  Post Acute Care Choice:  Durable Medical Equipment, Home Health Choice offered to:  Patient  DME Arranged:  3-N-1, CPM, Walker rolling DME Agency:  [employer]  HH Arranged:  PT HH Agency:  [facility]  Status of Service:  Completed, signed off  Medicare Important Message Given:    Date Medicare IM Given:    Medicare IM give by:    Date Additional Medicare IM Given:    Additional Medicare Important Message give by:     If discussed at [place] of Stay Meetings, dates discussed:    Additional Comments:  Nila Nephew, RN 07/04/2014, 3:41 PM

## 2014-07-05 LAB — CBC
HCT: 31 % — ABNORMAL LOW (ref 36.0–46.0)
Hemoglobin: 10.1 g/dL — ABNORMAL LOW (ref 12.0–15.0)
MCH: 26.9 pg (ref 26.0–34.0)
MCHC: 32.6 g/dL (ref 30.0–36.0)
MCV: 82.7 fL (ref 78.0–100.0)
PLATELETS: 215 10*3/uL (ref 150–400)
RBC: 3.75 MIL/uL — ABNORMAL LOW (ref 3.87–5.11)
RDW: 12.9 % (ref 11.5–15.5)
WBC: 9.1 10*3/uL (ref 4.0–10.5)

## 2014-07-05 LAB — GLUCOSE, CAPILLARY
GLUCOSE-CAPILLARY: 155 mg/dL — AB (ref 65–99)
GLUCOSE-CAPILLARY: 159 mg/dL — AB (ref 65–99)

## 2014-07-05 NOTE — Progress Notes (Signed)
Physical Therapy Treatment Patient Details Name: Gloria Rose MRN: [id number] DOB: 1950-09-25 Today's Date: 07/05/2014    History of Present Illness Patient is a 64 y/o female s/p L TKA. PMH includes HTN, DM, depression and ca.     PT Comments    Patient progressing much better this session. Has control of L knee and movement. Able to complete stair training this AM. Patient safe to D/C from a mobility standpoint based on progression towards goals set on PT eval.    Follow Up Recommendations  Home health PT;Supervision/Assistance - 24 hour     Equipment Recommendations  None recommended by PT    Recommendations for Other Services       Precautions / Restrictions Precautions Precautions: Knee Precaution Comments: Reviewed no pillow under knee and zero degree knee.  Restrictions LLE Weight Bearing: Weight bearing as tolerated    Mobility  Bed Mobility Overal bed mobility: Modified Independent                Transfers Overall transfer level: Modified independent                  Ambulation/Gait Ambulation/Gait assistance: Supervision Ambulation Distance (Feet): 250 Feet Assistive device: Rolling walker (2 wheeled) Gait Pattern/deviations: Step-through pattern Gait velocity: increasing well.        Stairs   Stairs assistance: Supervision Stair Management: Two rails;Step to pattern;Forwards Number of Stairs: 6 General stair comments: patient educated on step technique and sequence. Able to complete with no buckling.   Wheelchair Mobility    Modified Rankin (Stroke Patients Only)       Balance                                    Cognition Arousal/Alertness: Awake/alert Behavior During Therapy: WFL for tasks assessed/performed Overall Cognitive Status: Within Functional Limits for tasks assessed                      Exercises Total Joint Exercises Quad Sets: 10 reps;Supine;Left Heel Slides: Left;10  reps;AROM Hip ABduction/ADduction: AROM;Left;10 reps Straight Leg Raises: Left;10 reps;AROM Long Arc Quad: Left;10 reps;AROM    General Comments        Pertinent Vitals/Pain Pain Score: 2  Pain Location: L knee Pain Descriptors / Indicators: Sore Pain Intervention(s): Monitored during session    Home Living                      Prior Function            PT Goals (current goals can now be found in the care plan section) Progress towards PT goals: Progressing toward goals    Frequency  7X/week    PT Plan Current plan remains appropriate    Co-evaluation             End of Session   Activity Tolerance: Patient tolerated treatment well Patient left: in chair;with call bell/phone within reach     Time: 0845-0909 PT Time Calculation (min) (ACUTE ONLY): 24 min  Charges:  $Gait Training: 8-22 mins $Therapeutic Exercise: 8-22 mins                    G Codes:      Jacqualyn Posey 07/05/2014, 9:13 AM 07/05/2014 Jacqualyn Posey PTA [phone] pager [phone] office

## 2014-07-05 NOTE — Progress Notes (Signed)
SPORTS MEDICINE AND JOINT REPLACEMENT  Gloria Mulch, MD   Gloria Spry, PA-C [place], [place],   [address]                             [phone]   PROGRESS NOTE  Subjective:  negative for Chest Pain  negative for Shortness of Breath  negative for Nausea/Vomiting   negative for Calf Pain  negative for Bowel Movement   Tolerating Diet: yes         Patient reports pain as 4 on 0-10 scale.    Objective: Vital signs in last 24 hours:   Patient Vitals for the past 24 hrs:  BP Temp Temp src Pulse Resp SpO2  07/05/14 0518 (!) 111/58 mmHg 97.5 F (36.4 C) - 60 17 96 %  07/04/14 2011 (!) 106/45 mmHg 97.5 F (36.4 C) Oral 62 16 93 %  07/04/14 1417 (!) 111/58 mmHg 98.6 F (37 C) - 67 16 94 %    @flow {1959:LAST@   Intake/Output from previous day:   06/28 0701 - 06/29 0700 In: 1860 [P.O.:1860] Out: -    Intake/Output this shift:   06/29 0701 - 06/29 1900 In: 480 [P.O.:480] Out: -    Intake/Output      06/28 0701 - 06/29 0700 06/29 0701 - 06/30 0700   P.O. 1860 480   I.V. (mL/kg)     Total Intake(mL/kg) 1860 (19.9) 480 (5.1)   Urine (mL/kg/hr)     Blood     Total Output       Net +1860 +480        Urine Occurrence 3 x       LABORATORY DATA:  Recent Labs  07/03/14 1410 07/04/14 0730 07/05/14 0450  WBC 7.3 11.4* 9.1  HGB 11.8* 12.2 10.1*  HCT 35.7* 37.3 31.0*  PLT 207 249 215    Recent Labs  07/03/14 1410 07/04/14 0730  NA  --  133*  K  --  4.0  CL  --  100*  CO2  --  29  BUN  --  9  CREATININE 0.80 0.78  GLUCOSE  --  176*  CALCIUM  --  8.4*   Lab Results  Component Value Date   INR 1.12 06/22/2014    Examination:  General appearance: alert, appears stated age and no distress Extremities: extremities normal, atraumatic, no cyanosis or edema and Homans sign is negative, no sign of DVT  Wound Exam: clean, dry, intact   Drainage:  None: wound tissue dry  Motor Exam: EHL and FHL Intact  Sensory Exam: Deep Peroneal  normal   Assessment:    2 Days Post-Op  Procedure(s) (LRB): LEFT TOTAL KNEE ARTHROPLASTY (Left)  ADDITIONAL DIAGNOSIS:  Active Problems:   S/P total knee arthroplasty  Acute Blood Loss Anemia   Plan: Physical Therapy as ordered Weight Bearing as Tolerated (WBAT)  DVT Prophylaxis:  Lovenox  DISCHARGE PLAN: Home  DISCHARGE NEEDS: HHPT, CPM, Walker and 3-in-1 comode seat         Gloria Rose 07/05/2014, 11:45 AM

## 2014-07-05 NOTE — Progress Notes (Signed)
Physical Therapy Treatment Patient Details Name: Gloria Rose MRN: [id number] DOB: 1950/07/19 Today's Date: 07/05/2014    History of Present Illness Patient is a 64 y/o female s/p L TKA. PMH includes HTN, DM, depression and ca.     PT Comments    Patient caught me while walking out in hallway to asked if she could practice steps once more prior to discharging home. Patient is super motivated and progressing very well. Educated to continue with ambulation. Cues required for step sequence. Patient safe to D/C from a mobility standpoint based on progression towards goals set on PT eval.    Follow Up Recommendations  Home health PT;Supervision/Assistance - 24 hour     Equipment Recommendations  None recommended by PT    Recommendations for Other Services       Precautions / Restrictions Precautions Precautions: Knee Precaution Comments: Reviewed no pillow under knee and zero degree knee.  Restrictions LLE Weight Bearing: Weight bearing as tolerated    Mobility  Bed Mobility Overal bed mobility: Modified Independent                Transfers Overall transfer level: Modified independent                  Ambulation/Gait Ambulation/Gait assistance: Modified independent (Device/Increase time) Ambulation Distance (Feet): 300 Feet Assistive device: Rolling walker (2 wheeled)           Stairs   Stairs assistance: Supervision Stair Management: Two rails;Step to pattern;Forwards Number of Stairs: 10 General stair comments: reminders for step sequence  Wheelchair Mobility    Modified Rankin (Stroke Patients Only)       Balance                                    Cognition Arousal/Alertness: Awake/alert Behavior During Therapy: WFL for tasks assessed/performed Overall Cognitive Status: Within Functional Limits for tasks assessed                      Exercises      General Comments        Pertinent Vitals/Pain Pain  Assessment: No/denies pain    Home Living                      Prior Function            PT Goals (current goals can now be found in the care plan section) Progress towards PT goals: Progressing toward goals    Frequency  7X/week    PT Plan Current plan remains appropriate    Co-evaluation             End of Session   Activity Tolerance: Patient tolerated treatment well Patient left: in bed;with call bell/phone within reach     Time: 1330-1345 PT Time Calculation (min) (ACUTE ONLY): 15 min  Charges:  $Gait Training: 8-22 mins                    G Codes:      Jacqualyn Posey 07/05/2014, 2:00 PM 07/05/2014 Jacqualyn Posey PTA [phone] pager [phone] office

## 2014-07-05 NOTE — Care Management (Signed)
Important Message  Patient Details  Name: Gloria Rose MRN: [id number] Date of Birth: 04-23-1950   Medicare Important Message Given:  Yes-second notification given    Louanne Belton 07/05/2014, 3:00 PM

## 2014-07-07 DIAGNOSIS — Z96652 Presence of left artificial knee joint: Secondary | ICD-10-CM | POA: Diagnosis not present

## 2014-07-07 DIAGNOSIS — M6281 Muscle weakness (generalized): Secondary | ICD-10-CM | POA: Diagnosis not present

## 2014-07-07 DIAGNOSIS — Z471 Aftercare following joint replacement surgery: Secondary | ICD-10-CM | POA: Diagnosis not present

## 2014-07-07 DIAGNOSIS — I10 Essential (primary) hypertension: Secondary | ICD-10-CM | POA: Diagnosis not present

## 2014-07-11 DIAGNOSIS — Z96652 Presence of left artificial knee joint: Secondary | ICD-10-CM | POA: Diagnosis not present

## 2014-07-11 DIAGNOSIS — Z471 Aftercare following joint replacement surgery: Secondary | ICD-10-CM | POA: Diagnosis not present

## 2014-07-11 DIAGNOSIS — I10 Essential (primary) hypertension: Secondary | ICD-10-CM | POA: Diagnosis not present

## 2014-07-11 DIAGNOSIS — M6281 Muscle weakness (generalized): Secondary | ICD-10-CM | POA: Diagnosis not present

## 2014-07-12 DIAGNOSIS — Z471 Aftercare following joint replacement surgery: Secondary | ICD-10-CM | POA: Diagnosis not present

## 2014-07-12 DIAGNOSIS — M6281 Muscle weakness (generalized): Secondary | ICD-10-CM | POA: Diagnosis not present

## 2014-07-12 DIAGNOSIS — Z96652 Presence of left artificial knee joint: Secondary | ICD-10-CM | POA: Diagnosis not present

## 2014-07-12 DIAGNOSIS — I10 Essential (primary) hypertension: Secondary | ICD-10-CM | POA: Diagnosis not present

## 2014-07-13 DIAGNOSIS — M6281 Muscle weakness (generalized): Secondary | ICD-10-CM | POA: Diagnosis not present

## 2014-07-13 DIAGNOSIS — I10 Essential (primary) hypertension: Secondary | ICD-10-CM | POA: Diagnosis not present

## 2014-07-13 DIAGNOSIS — Z471 Aftercare following joint replacement surgery: Secondary | ICD-10-CM | POA: Diagnosis not present

## 2014-07-13 DIAGNOSIS — Z96652 Presence of left artificial knee joint: Secondary | ICD-10-CM | POA: Diagnosis not present

## 2014-07-14 DIAGNOSIS — Z471 Aftercare following joint replacement surgery: Secondary | ICD-10-CM | POA: Diagnosis not present

## 2014-07-14 DIAGNOSIS — M6281 Muscle weakness (generalized): Secondary | ICD-10-CM | POA: Diagnosis not present

## 2014-07-14 DIAGNOSIS — I10 Essential (primary) hypertension: Secondary | ICD-10-CM | POA: Diagnosis not present

## 2014-07-14 DIAGNOSIS — Z96652 Presence of left artificial knee joint: Secondary | ICD-10-CM | POA: Diagnosis not present

## 2014-07-17 DIAGNOSIS — Z96652 Presence of left artificial knee joint: Secondary | ICD-10-CM | POA: Diagnosis not present

## 2014-07-17 DIAGNOSIS — I10 Essential (primary) hypertension: Secondary | ICD-10-CM | POA: Diagnosis not present

## 2014-07-17 DIAGNOSIS — Z471 Aftercare following joint replacement surgery: Secondary | ICD-10-CM | POA: Diagnosis not present

## 2014-07-17 DIAGNOSIS — M6281 Muscle weakness (generalized): Secondary | ICD-10-CM | POA: Diagnosis not present

## 2014-07-18 DIAGNOSIS — Z96652 Presence of left artificial knee joint: Secondary | ICD-10-CM | POA: Diagnosis not present

## 2014-07-18 DIAGNOSIS — Z471 Aftercare following joint replacement surgery: Secondary | ICD-10-CM | POA: Diagnosis not present

## 2014-07-24 NOTE — Discharge Summary (Signed)
[facility]   Lara Mulch, MD   Carlynn Spry, PA-C [place], [place], [place]  [address]                             [phone]  PATIENT ID: ITZA MANIACI        MRN:  [id number]          DOB/AGE: 03-03-1950 / 64 y.o.    DISCHARGE SUMMARY  ADMISSION DATE:    07/03/2014 DISCHARGE DATE:  07/05/2014  ADMISSION DIAGNOSIS: Primary osteoarthritis left knee    DISCHARGE DIAGNOSIS:  Primary osteoarthritis left knee    ADDITIONAL DIAGNOSIS: Active Problems:   S/P total knee arthroplasty  Past Medical History  Diagnosis Date  . Hypertension   . Diabetes mellitus without complication     dx 7-8 yrs  type 2  . Depression   . GERD (gastroesophageal reflux disease)   . Cancer     melanoma of toe (big toe on left foot) removed    PROCEDURE: Procedure(s): LEFT TOTAL KNEE ARTHROPLASTY on 07/03/2014  CONSULTS:     HISTORY:  See H&P in chart  HOSPITAL COURSE:  ADALEENA MOOERS is a 64 y.o. admitted on 07/03/2014 and found to have a diagnosis of Primary osteoarthritis left knee.  After appropriate laboratory studies were obtained  they were taken to the operating room on 07/03/2014 and underwent Procedure(s): LEFT TOTAL KNEE ARTHROPLASTY.   They were given perioperative antibiotics:  Anti-infectives    Start     Dose/Rate Route Frequency Ordered Stop   07/03/14 1315  vancomycin (VANCOCIN) IVPB 1000 mg/200 mL premix     1,000 mg 200 mL/hr over 60 Minutes Intravenous Every 12 hours 07/03/14 1312 07/03/14 1604   07/03/14 0850  vancomycin (VANCOCIN) 1 GM/200ML IVPB    Comments:  Merrilyn Puma   : cabinet override      07/03/14 0850 07/03/14 0956   07/03/14 0730  ceFAZolin (ANCEF) IVPB 2 g/50 mL premix  Status:  Discontinued     2 g 100 mL/hr over 30 Minutes Intravenous On call to O.R. 07/03/14 7673 07/03/14 0804    .  Tolerated the procedure well.  Placed with a foley intraoperatively.  Given Ofirmev at induction and for 48 hours.    POD# 1:  Vital signs were stable.  Patient denied Chest pain, shortness of breath, or calf pain.  Patient was started on Lovenox 30 mg subcutaneously twice daily at 8am.  Consults to PT, OT, and care management were made.  The patient was weight bearing as tolerated.  CPM was placed on the operative leg 0-90 degrees for 6-8 hours a day.  Incentive spirometry was taught.  Dressing was changed.  Hemovac was discontinued.      POD #2, Continued  PT for ambulation and exercise program.  IV saline locked.  O2 discontinued.    The remainder of the hospital course was dedicated to ambulation and strengthening.   The patient was discharged on 2 days post op in  Good condition.  Blood products given:none  DIAGNOSTIC STUDIES: Recent vital signs: No data found.      Recent laboratory studies: No results for input(s): WBC, HGB, HCT, PLT in the last 168 hours. No results for input(s): NA, K, CL, CO2, BUN, CREATININE, GLUCOSE, CALCIUM in the last 168 hours. Lab Results  Component Value Date   INR 1.12 06/22/2014     Recent Radiographic Studies :  No results found.  DISCHARGE INSTRUCTIONS: Discharge Instructions    CPM    Complete by:  As directed   Continuous passive motion machine (CPM):      Use the CPM from 0 to 90 for 6-8 hours per day.      You may increase by 10  per day.  You may break it up into 2 or 3 sessions per day.      Use CPM for 2 weeks or until you are told to stop.     Call MD / Call 911    Complete by:  As directed   If you experience chest pain or shortness of breath, CALL 911 and be transported to the hospital emergency room.  If you develope a fever above 101 F, pus (white drainage) or increased drainage or redness at the wound, or calf pain, call your surgeon's office.     Change dressing    Complete by:  As directed   Change dressing on Thursday, then change the dressing daily with sterile 4 x 4 inch gauze dressing and apply TED hose.     Constipation Prevention    Complete  by:  As directed   Drink plenty of fluids.  Prune juice may be helpful.  You may use a stool softener, such as Colace (over the counter) 100 mg twice a day.  Use MiraLax (over the counter) for constipation as needed.     Diet - low sodium heart healthy    Complete by:  As directed      Do not put a pillow under the knee. Place it under the heel.    Complete by:  As directed      Driving restrictions    Complete by:  As directed   No driving for 6 weeks     Increase activity slowly as tolerated    Complete by:  As directed      Lifting restrictions    Complete by:  As directed   No lifting for 6 weeks     TED hose    Complete by:  As directed   Use stockings (TED hose) for 2 weeks on both leg(s).  You may remove them at night for sleeping.           DISCHARGE MEDICATIONS:     Medication List    STOP taking these medications        aspirin 81 MG chewable tablet      TAKE these medications        amLODipine 5 MG tablet  Commonly known as:  NORVASC  Take 5 mg by mouth daily.     atenolol 100 MG tablet  Commonly known as:  TENORMIN  Take 100 mg by mouth daily.     celecoxib 200 MG capsule  Commonly known as:  CELEBREX  Take 1 capsule (200 mg total) by mouth every 12 (twelve) hours.     enoxaparin 40 MG/0.4ML injection  Commonly known as:  LOVENOX  Inject 0.4 mLs (40 mg total) into the skin daily.     hydrochlorothiazide 25 MG tablet  Commonly known as:  HYDRODIURIL  Take 25 mg by mouth daily.     lisinopril 20 MG tablet  Commonly known as:  PRINIVIL,ZESTRIL  Take 20 mg by mouth daily.     metFORMIN 500 MG tablet  Commonly known as:  GLUCOPHAGE  Take 500 mg by mouth 2 (two) times daily with a meal.     methocarbamol  500 MG tablet  Commonly known as:  ROBAXIN  Take 1-2 tablets (500-1,000 mg total) by mouth every 6 (six) hours as needed for muscle spasms.     omeprazole 20 MG capsule  Commonly known as:  PRILOSEC  Take 20 mg by mouth daily.     oxyCODONE 5  MG immediate release tablet  Commonly known as:  Oxy IR/ROXICODONE  Take 1-2 tablets (5-10 mg total) by mouth every 3 (three) hours as needed for breakthrough pain.     OxyCODONE 10 mg T12a 12 hr tablet  Commonly known as:  OXYCONTIN  Take 1 tablet (10 mg total) by mouth every 12 (twelve) hours.     PARoxetine 40 MG tablet  Commonly known as:  PAXIL  Take 40 mg by mouth 2 (two) times daily.     pravastatin 40 MG tablet  Commonly known as:  PRAVACHOL  Take 40 mg by mouth daily.        FOLLOW UP VISIT:       Follow-up Information    Follow up with Rudean Haskell, MD. Call on 07/18/2014.   Specialty:  Orthopedic Surgery   Contact information:   [address] [place] [address] [phone]       Follow up with [facility].   Why:  They will contact you to schedule home therapy visits.   Contact information:   [address] [place] [address] [phone]       DISPOSITION: HOME    CONDITION:  Good   Candise Crabtree 07/24/2014, 9:45 AM

## 2014-07-27 ENCOUNTER — Ambulatory Visit (HOSPITAL_COMMUNITY): Payer: Medicare Other | Attending: Orthopedic Surgery | Admitting: Physical Therapy

## 2014-07-27 DIAGNOSIS — R269 Unspecified abnormalities of gait and mobility: Secondary | ICD-10-CM | POA: Diagnosis not present

## 2014-07-27 DIAGNOSIS — R29898 Other symptoms and signs involving the musculoskeletal system: Secondary | ICD-10-CM

## 2014-07-27 DIAGNOSIS — Z96652 Presence of left artificial knee joint: Secondary | ICD-10-CM | POA: Diagnosis not present

## 2014-07-27 DIAGNOSIS — R262 Difficulty in walking, not elsewhere classified: Secondary | ICD-10-CM | POA: Diagnosis not present

## 2014-07-27 DIAGNOSIS — M25662 Stiffness of left knee, not elsewhere classified: Secondary | ICD-10-CM

## 2014-07-27 NOTE — Patient Instructions (Signed)
Straight Leg Raise   Tighten stomach and slowly raise locked right leg _15-18 inches from floor. Repeat __10__ times per set. Do __1__ sets per session. Do _1-2___ sessions per day.  http://orth.exer.us/1103   Copyright  VHI. All rights reserved.  Abduction: Clam (Eccentric) - Side-Lying   Lie on side with knees bent. Lift top knee, keeping feet together. Keep trunk steady.  __10_ reps per set, __1_ sets per day, 1 time per day.    Copyright  VHI. All rights reserved.  Hamstring Stretch   Inhale and straighten spine. Exhale and lean forward toward extended leg. Hold position for _30__ seconds. Inhale and come back to center. Repeat with other leg extended. Repeat _3__ times, alternating legs. Do _1__ times per day.  Copyright  VHI. All rights reserved.

## 2014-07-27 NOTE — Therapy (Signed)
[facility] [place], [place], [address] Phone: [phone]   Fax:  [phone]  Physical Therapy Evaluation  Patient Details  Name: Gloria Rose MRN: [id number] Date of Birth: 1950/07/04 Referring Provider:  Vickey Huger, MD  Encounter Date: 07/27/2014      PT End of Session - 07/27/14 1216    Visit Number 1   Number of Visits 12   Date for PT Re-Evaluation 08/27/14   Authorization Type [employer] medicare   Authorization - Visit Number 1   Authorization - Number of Visits 10   PT Start Time 1017   PT Stop Time 1059   PT Time Calculation (min) 42 min   Activity Tolerance Patient tolerated treatment well   Behavior During Therapy [facility] for tasks assessed/performed      Past Medical History  Diagnosis Date  . Hypertension   . Diabetes mellitus without complication     dx 7-8 yrs  type 2  . Depression   . GERD (gastroesophageal reflux disease)   . Cancer     melanoma of toe (big toe on left foot) removed    Past Surgical History  Procedure Laterality Date  . Rtr      RIGHT SHOULDER IN 2013  . Total knee arthroplasty Left 07/03/2014    Procedure: LEFT TOTAL KNEE ARTHROPLASTY;  Surgeon: Vickey Huger, MD;  Location: [facility];  Service: Orthopedics;  Laterality: Left;    There were no vitals filed for this visit.  Visit Diagnosis:  Status post total left knee replacement  Difficulty walking  Weakness of left leg  Stiffness of left knee  Abnormality of gait      Subjective Assessment - 07/27/14 1020    Subjective Pt had L TKA on 07/03/14. She is still having difficulty walking, and experiences muscle spasms, especially at night when trying to go to sleep. She reports that she feels her knee keeps getting stiffer. She had HHPT until 07/17/14, which she had a lot of success with, and she has been using the CPM machine.   How long can you sit comfortably? no limitations   How long can you stand comfortably? 10-15  minutes   How long can you walk comfortably? 10-15 minutes   Patient Stated Goals Decrease pain, be able to walk without pain   Currently in Pain? Yes   Pain Score 6    Pain Location Knee   Pain Orientation Left   Pain Descriptors / Indicators Aching            [facility] PT Assessment - 07/27/14 0001    Assessment   Medical Diagnosis s/p L TKA   Onset Date/Surgical Date 07/03/14   Next MD Visit next month around 8/12-8/13   Prior Therapy Yes- HHPT   Restrictions   Weight Bearing Restrictions No   Balance Screen   Has the patient fallen in the past 6 months Yes   How many times? 1  prior to surgery   Has the patient had a decrease in activity level because of a fear of falling?  No   Is the patient reluctant to leave their home because of a fear of falling?  No   Home Environment   Living Environment Private residence   Living Arrangements Spouse/significant other   Type of [place] to enter   Entrance Stairs-Number of Steps 3   Entrance Stairs-Rails Right;Left   Prior Function   Level of  Independence Independent   Vocation Retired   Leisure read, play cards   Observation/Other Assessments   Focus on Therapeutic Outcomes (FOTO)  69% limitation   ROM / Strength   AROM / PROM / Strength AROM;PROM;Strength   AROM   AROM Assessment Site Knee   Right/Left Knee Left   Left Knee Extension -6   Left Knee Flexion 93   PROM   PROM Assessment Site Hip;Knee   Right/Left Hip Right;Left   Right Hip External Rotation  40   Right Hip Internal Rotation  35   Left Hip External Rotation  38   Left Hip Internal Rotation  33   Right/Left Knee Right;Left   Left Knee Extension -3   Left Knee Flexion 99   Strength   Strength Assessment Site Hip;Knee;Ankle   Right/Left Hip Right;Left   Right Hip Flexion 4/5   Right Hip Extension 4/5   Right Hip ABduction 4/5   Left Hip Flexion 3+/5   Left Hip Extension 4-/5   Left Hip ABduction 3/5   Right/Left Knee  Right;Left   Right Knee Flexion 4+/5   Right Knee Extension 5/5   Left Knee Flexion 3+/5   Left Knee Extension 3+/5   Right/Left Ankle Right;Left   Right Ankle Dorsiflexion 4+/5   Left Ankle Dorsiflexion 4+/5   Transfers   Five time sit to stand comments  14.91 seconds   Ambulation/Gait   Ambulation/Gait Yes   Ambulation/Gait Assistance 6: Modified independent (Device/Increase time)   Gait Pattern Decreased step length - right;Decreased stance time - left;Decreased weight shift to right;Left foot flat   Gait Comments TUG 10.86 seconds                 PT Education - 07/27/14 1215    Education provided Yes   Education Details Educated on HEP, POC moving forward   Person(s) Educated Patient   Methods Explanation;Handout   Comprehension Verbalized understanding;Returned demonstration          PT Short Term Goals - 07/27/14 1638    PT SHORT TERM GOAL #1   Title Pt will be independent in HEP.    Time 3   Period Weeks   Status New   PT SHORT TERM GOAL #2   Title Pt will demonstrate improved BLE strength and functional mobility as evidenced by <12 seconds on five time sit to stand test.   Baseline 14.91   Time 3   Period Weeks   Status New   PT SHORT TERM GOAL #3   Title Improve knee PROM to 2-105 to improve gait mechanics.    Baseline 3-99   Time 3   Period Weeks   Status New   PT SHORT TERM GOAL #4   Title Pt will demonstrate improved gait speed evidenced by <9 seconds on TUG.   Baseline 10.86   Time 3   Period Weeks   Status New           PT Long Term Goals - 07/27/14 1641    PT LONG TERM GOAL #1   Title Improve L knee PROM to 0-115 to normalize gait mechanics.    Time 6   Period Weeks   Status New   PT LONG TERM GOAL #2   Title Pt will demonstrate improved BLE strength and functional mobility evidenced by <10 seconds on five time sit to stand.    Time 6   Period Weeks   Status New   PT LONG TERM GOAL #3  Title Pt will demonstrate improved  gait speed evidenced by <8 seconds on TUG.    Time 6   Period Weeks   Status New   PT LONG TERM GOAL #4   Title Pt will ambulate 1,000 feet with proper heel strike on L, equal weightbearing, and equal step length.    Time 6   Period Weeks   Status New               Plan - August 26, 2014 1634    Clinical Impression Statement Pt presents to PT s/p L TKA, demonstrating impairments in ROM, strength, balance, gait mechanics, and functional mobility. She will benefit from skilled physical therapy to address these impairments in order to return pt to PLOF, improve quality of life, and decrease caregiver burden. It was recommended to pt to attend PT 3x per week for at least 2 weeks, however, she would prefer to come 2x per week due to $40 copay.    Pt will benefit from skilled therapeutic intervention in order to improve on the following deficits Abnormal gait;Decreased activity tolerance;Decreased balance;Decreased range of motion;Decreased strength;Decreased scar mobility;Difficulty walking;Pain   Rehab Potential Good   PT Frequency 2x / week  recommended 3x per week, pt not agreeable due to high copay.    PT Duration 6 weeks   PT Treatment/Interventions Gait training;Stair training;Functional mobility training;Therapeutic activities;Therapeutic exercise;Balance training;Neuromuscular re-education;Patient/family education;Manual techniques;Scar mobilization          G-Codes - 08-26-2014 1643    Functional Assessment Tool Used FOTO   Functional Limitation Mobility: Walking and moving around   Mobility: Walking and Moving Around Current Status [phone]) At least 60 percent but less than 80 percent impaired, limited or restricted   Mobility: Walking and Moving Around Goal Status [phone]) At least 40 percent but less than 60 percent impaired, limited or restricted       Problem List Patient Active Problem List   Diagnosis Date Noted  . S/P total knee arthroplasty 07/03/2014    Hilma Favors, PT, DPT [phone] 08-26-2014, 4:45 PM  [facility] [address] [place], [place], [address] Phone: [phone]   Fax:  [phone]

## 2014-07-31 ENCOUNTER — Ambulatory Visit (HOSPITAL_COMMUNITY): Payer: Medicare Other | Admitting: Physical Therapy

## 2014-08-03 ENCOUNTER — Ambulatory Visit (HOSPITAL_COMMUNITY): Payer: Medicare Other | Admitting: Physical Therapy

## 2014-08-03 DIAGNOSIS — M25662 Stiffness of left knee, not elsewhere classified: Secondary | ICD-10-CM | POA: Diagnosis not present

## 2014-08-03 DIAGNOSIS — R269 Unspecified abnormalities of gait and mobility: Secondary | ICD-10-CM | POA: Diagnosis not present

## 2014-08-03 DIAGNOSIS — Z96652 Presence of left artificial knee joint: Secondary | ICD-10-CM | POA: Diagnosis not present

## 2014-08-03 DIAGNOSIS — R29898 Other symptoms and signs involving the musculoskeletal system: Secondary | ICD-10-CM | POA: Diagnosis not present

## 2014-08-03 DIAGNOSIS — R262 Difficulty in walking, not elsewhere classified: Secondary | ICD-10-CM

## 2014-08-03 NOTE — Patient Instructions (Signed)
Toe / Heel Raise   Gently rock back on heels and raise toes. Then rock forward on toes and raise heels. Repeat sequence _10___ times per session. Do _2___ sessions per day   Heel Raises   Stand with support. Tighten pelvic floor and hold. With knees straight, raise heels off ground. Repeat _10__ times. Do __2_ times a day.

## 2014-08-03 NOTE — Therapy (Addendum)
[facility] [place], [place], [address] Phone: [phone]   Fax:  [phone]  Physical Therapy Treatment  Patient Details  Name: Gloria Rose MRN: [id number] Date of Birth: 27-Jul-1950 Referring Provider:  Vickey Huger, MD  Encounter Date: 08/03/2014      PT End of Session - 08/03/14 1234    Visit Number 2   Number of Visits 12   Date for PT Re-Evaluation 08/27/14   Authorization Type [employer] medicare   Authorization - Visit Number 2   Authorization - Number of Visits 10   PT Start Time 1104   PT Stop Time 1146   PT Time Calculation (min) 42 min   Activity Tolerance Patient tolerated treatment well   Behavior During Therapy [facility] for tasks assessed/performed      Past Medical History  Diagnosis Date  . Hypertension   . Diabetes mellitus without complication     dx 7-8 yrs  type 2  . Depression   . GERD (gastroesophageal reflux disease)   . Cancer     melanoma of toe (big toe on left foot) removed    Past Surgical History  Procedure Laterality Date  . Rtr      RIGHT SHOULDER IN 2013  . Total knee arthroplasty Left 07/03/2014    Procedure: LEFT TOTAL KNEE ARTHROPLASTY;  Surgeon: Vickey Huger, MD;  Location: [facility];  Service: Orthopedics;  Laterality: Left;    There were no vitals filed for this visit.  Visit Diagnosis:  Status post total left knee replacement  Difficulty walking  Weakness of left leg  Stiffness of left knee  Abnormality of gait      Subjective Assessment - 08/03/14 1238    Subjective Pt states she really has no pain, just stiffness   Currently in Pain? No/denies                         [facility] Adult PT Treatment/Exercise - 08/03/14 1134    Knee/Hip Exercises: Stretches   Active Hamstring Stretch Left;3 reps;30 seconds   Active Hamstring Stretch Limitations standing 12" box   Knee: Self-Stretch to increase Flexion Left;10 seconds   Knee: Self-Stretch Limitations 10  reps on 12" box   Knee/Hip Exercises: Aerobic   Stationary Bike 8 minutes full revolutions seat 9   Knee/Hip Exercises: Standing   Heel Raises 10 reps   Heel Raises Limitations toeraises 10 reps   Knee Flexion Left;10 reps   Knee/Hip Exercises: Supine   Knee Extension PROM;AAROM   Knee Extension Limitations 3   Knee Flexion PROM;AAROM   Knee Flexion Limitations 101                PT Education - 08/03/14 1241    Education provided Yes   Education Details updated HEP and copy of evaluation given to patient   Person(s) Educated Patient   Methods Explanation;Handout;Demonstration   Comprehension Verbalized understanding;Returned demonstration          PT Short Term Goals - 07/27/14 1638    PT SHORT TERM GOAL #1   Title Pt will be independent in HEP.    Time 3   Period Weeks   Status New   PT SHORT TERM GOAL #2   Title Pt will demonstrate improved BLE strength and functional mobility as evidenced by <12 seconds on five time sit to stand test.   Baseline 14.91   Time 3   Period Weeks  Status New   PT SHORT TERM GOAL #3   Title Improve knee PROM to 2-105 to improve gait mechanics.    Baseline 3-99   Time 3   Period Weeks   Status New   PT SHORT TERM GOAL #4   Title Pt will demonstrate improved gait speed evidenced by <9 seconds on TUG.   Baseline 10.86   Time 3   Period Weeks   Status New           PT Long Term Goals - 07/27/14 1641    PT LONG TERM GOAL #1   Title Improve L knee PROM to 0-115 to normalize gait mechanics.    Time 6   Period Weeks   Status New   PT LONG TERM GOAL #2   Title Pt will demonstrate improved BLE strength and functional mobility evidenced by <10 seconds on five time sit to stand.    Time 6   Period Weeks   Status New   PT LONG TERM GOAL #3   Title Pt will demonstrate improved gait speed evidenced by <8 seconds on TUG.    Time 6   Period Weeks   Status New   PT LONG TERM GOAL #4   Title Pt will ambulate 1,000 feet with  proper heel strike on L, equal weightbearing, and equal step length.    Time 6   Period Weeks   Status New               Plan - 08/03/14 1235    Clinical Impression Statement Pt request to decrease to 1X week due to high co-payment.  Instructed with new exercises per flow sheet and patient given written instructions to add to HEP.  Pt also given copy of evaluation with explanation of measurements and goals.  Pt without  questions or concerns.  Able to achieve ROM of 3-101 today in supine.   PT Frequency 1x / week  recommended 3x per week, pt not agreeable due to high copay.    PT Next Visit Plan Progress ROM and strength of Lt knee, updating HEP.  Continue 1X week per patient request due to high copayment.  Begin manual if needed for swelling and adhesions.    [id number] CK G8980 CL    Problem List Patient Active Problem List   Diagnosis Date Noted  . S/P total knee arthroplasty 07/03/2014    Teena Irani, PTA/CLT [phone]  08/03/2014, 12:42 PM  [facility] [address] [place], [place], [address] Phone: [phone]   Fax:  [phone]  PHYSICAL THERAPY DISCHARGE SUMMARY  Visits from Start of Care: 2  Current functional level related to goals / functional outcomes: unknown   Remaining deficits: unknown   Education / Equipment: HEP  Plan: Patient agrees to discharge.  Patient goals were not met. Patient is being discharged due to not returning since the last visit.  ?????       Rayetta Humphrey, [place] CLT [phone]

## 2014-08-08 ENCOUNTER — Encounter (HOSPITAL_COMMUNITY): Payer: Medicare Other

## 2014-08-09 ENCOUNTER — Ambulatory Visit (HOSPITAL_COMMUNITY): Payer: Medicare Other | Attending: Orthopedic Surgery

## 2014-08-09 ENCOUNTER — Telehealth (HOSPITAL_COMMUNITY): Payer: Self-pay

## 2014-08-09 NOTE — Telephone Encounter (Signed)
No show, called and left message indicating she has missed apt as well as told next apt date and time and contact information.    [address], [place]; [facility] [phone]

## 2014-08-15 ENCOUNTER — Encounter (HOSPITAL_COMMUNITY): Payer: Medicare Other

## 2014-08-17 ENCOUNTER — Encounter (HOSPITAL_COMMUNITY): Payer: Medicare Other

## 2014-08-22 ENCOUNTER — Encounter (HOSPITAL_COMMUNITY): Payer: Medicare Other

## 2014-08-24 ENCOUNTER — Encounter (HOSPITAL_COMMUNITY): Payer: Medicare Other | Admitting: Physical Therapy

## 2014-08-29 ENCOUNTER — Encounter (HOSPITAL_COMMUNITY): Payer: Medicare Other | Admitting: Physical Therapy

## 2014-08-31 ENCOUNTER — Encounter (HOSPITAL_COMMUNITY): Payer: Medicare Other | Admitting: Physical Therapy

## 2014-09-06 DIAGNOSIS — E119 Type 2 diabetes mellitus without complications: Secondary | ICD-10-CM | POA: Diagnosis not present

## 2014-10-17 DIAGNOSIS — G8929 Other chronic pain: Secondary | ICD-10-CM | POA: Insufficient documentation

## 2014-10-17 DIAGNOSIS — M19072 Primary osteoarthritis, left ankle and foot: Secondary | ICD-10-CM | POA: Diagnosis not present

## 2014-10-17 DIAGNOSIS — Z471 Aftercare following joint replacement surgery: Secondary | ICD-10-CM | POA: Diagnosis not present

## 2014-10-17 DIAGNOSIS — Z96652 Presence of left artificial knee joint: Secondary | ICD-10-CM | POA: Diagnosis not present

## 2014-10-17 DIAGNOSIS — M7731 Calcaneal spur, right foot: Secondary | ICD-10-CM | POA: Diagnosis not present

## 2014-10-17 DIAGNOSIS — M7989 Other specified soft tissue disorders: Secondary | ICD-10-CM | POA: Diagnosis not present

## 2014-10-17 DIAGNOSIS — M25571 Pain in right ankle and joints of right foot: Secondary | ICD-10-CM | POA: Diagnosis not present

## 2014-12-05 DIAGNOSIS — E119 Type 2 diabetes mellitus without complications: Secondary | ICD-10-CM | POA: Diagnosis not present

## 2015-03-05 DIAGNOSIS — E119 Type 2 diabetes mellitus without complications: Secondary | ICD-10-CM | POA: Diagnosis not present

## 2015-03-07 DIAGNOSIS — E119 Type 2 diabetes mellitus without complications: Secondary | ICD-10-CM | POA: Diagnosis not present

## 2015-03-29 DIAGNOSIS — J00 Acute nasopharyngitis [common cold]: Secondary | ICD-10-CM | POA: Diagnosis not present

## 2015-03-30 DIAGNOSIS — I10 Essential (primary) hypertension: Secondary | ICD-10-CM | POA: Diagnosis not present

## 2015-03-30 DIAGNOSIS — E119 Type 2 diabetes mellitus without complications: Secondary | ICD-10-CM | POA: Diagnosis not present

## 2015-03-30 DIAGNOSIS — E782 Mixed hyperlipidemia: Secondary | ICD-10-CM | POA: Diagnosis not present

## 2015-04-02 DIAGNOSIS — I10 Essential (primary) hypertension: Secondary | ICD-10-CM | POA: Diagnosis not present

## 2015-04-02 DIAGNOSIS — E782 Mixed hyperlipidemia: Secondary | ICD-10-CM | POA: Diagnosis not present

## 2015-04-02 DIAGNOSIS — E1165 Type 2 diabetes mellitus with hyperglycemia: Secondary | ICD-10-CM | POA: Diagnosis not present

## 2015-06-03 DIAGNOSIS — E119 Type 2 diabetes mellitus without complications: Secondary | ICD-10-CM | POA: Diagnosis not present

## 2015-06-27 DIAGNOSIS — R21 Rash and other nonspecific skin eruption: Secondary | ICD-10-CM | POA: Diagnosis not present

## 2015-06-27 DIAGNOSIS — L500 Allergic urticaria: Secondary | ICD-10-CM | POA: Diagnosis not present

## 2015-09-01 DIAGNOSIS — E119 Type 2 diabetes mellitus without complications: Secondary | ICD-10-CM | POA: Diagnosis not present

## 2015-09-16 DIAGNOSIS — E119 Type 2 diabetes mellitus without complications: Secondary | ICD-10-CM | POA: Diagnosis not present

## 2015-09-18 DIAGNOSIS — E119 Type 2 diabetes mellitus without complications: Secondary | ICD-10-CM | POA: Diagnosis not present

## 2015-09-18 DIAGNOSIS — E782 Mixed hyperlipidemia: Secondary | ICD-10-CM | POA: Diagnosis not present

## 2015-09-19 DIAGNOSIS — Z23 Encounter for immunization: Secondary | ICD-10-CM | POA: Diagnosis not present

## 2015-09-19 DIAGNOSIS — Z0001 Encounter for general adult medical examination with abnormal findings: Secondary | ICD-10-CM | POA: Diagnosis not present

## 2015-09-19 DIAGNOSIS — E119 Type 2 diabetes mellitus without complications: Secondary | ICD-10-CM | POA: Diagnosis not present

## 2015-09-19 DIAGNOSIS — R197 Diarrhea, unspecified: Secondary | ICD-10-CM | POA: Diagnosis not present

## 2015-09-19 DIAGNOSIS — I10 Essential (primary) hypertension: Secondary | ICD-10-CM | POA: Diagnosis not present

## 2015-09-19 DIAGNOSIS — E784 Other hyperlipidemia: Secondary | ICD-10-CM | POA: Diagnosis not present

## 2015-09-19 DIAGNOSIS — K219 Gastro-esophageal reflux disease without esophagitis: Secondary | ICD-10-CM | POA: Diagnosis not present

## 2015-10-01 ENCOUNTER — Other Ambulatory Visit (HOSPITAL_COMMUNITY): Payer: Self-pay | Admitting: Internal Medicine

## 2015-10-01 DIAGNOSIS — Z1231 Encounter for screening mammogram for malignant neoplasm of breast: Secondary | ICD-10-CM

## 2015-10-08 ENCOUNTER — Ambulatory Visit (HOSPITAL_COMMUNITY): Payer: Medicare Other

## 2015-10-12 DIAGNOSIS — R197 Diarrhea, unspecified: Secondary | ICD-10-CM | POA: Diagnosis not present

## 2015-10-15 DIAGNOSIS — R197 Diarrhea, unspecified: Secondary | ICD-10-CM | POA: Diagnosis not present

## 2015-11-27 DIAGNOSIS — E119 Type 2 diabetes mellitus without complications: Secondary | ICD-10-CM | POA: Diagnosis not present

## 2016-02-15 DIAGNOSIS — E119 Type 2 diabetes mellitus without complications: Secondary | ICD-10-CM | POA: Diagnosis not present

## 2016-03-13 DIAGNOSIS — I10 Essential (primary) hypertension: Secondary | ICD-10-CM | POA: Diagnosis not present

## 2016-03-13 DIAGNOSIS — E119 Type 2 diabetes mellitus without complications: Secondary | ICD-10-CM | POA: Diagnosis not present

## 2016-03-18 ENCOUNTER — Other Ambulatory Visit (HOSPITAL_COMMUNITY): Payer: Self-pay | Admitting: Internal Medicine

## 2016-03-18 DIAGNOSIS — E1165 Type 2 diabetes mellitus with hyperglycemia: Secondary | ICD-10-CM | POA: Diagnosis not present

## 2016-03-18 DIAGNOSIS — Z1231 Encounter for screening mammogram for malignant neoplasm of breast: Secondary | ICD-10-CM

## 2016-03-18 DIAGNOSIS — K219 Gastro-esophageal reflux disease without esophagitis: Secondary | ICD-10-CM | POA: Diagnosis not present

## 2016-03-18 DIAGNOSIS — I10 Essential (primary) hypertension: Secondary | ICD-10-CM | POA: Diagnosis not present

## 2016-03-18 DIAGNOSIS — E782 Mixed hyperlipidemia: Secondary | ICD-10-CM | POA: Diagnosis not present

## 2016-03-18 DIAGNOSIS — Z0000 Encounter for general adult medical examination without abnormal findings: Secondary | ICD-10-CM | POA: Diagnosis not present

## 2016-03-20 DIAGNOSIS — E119 Type 2 diabetes mellitus without complications: Secondary | ICD-10-CM | POA: Diagnosis not present

## 2016-03-24 ENCOUNTER — Other Ambulatory Visit: Payer: Self-pay | Admitting: Internal Medicine

## 2016-03-24 DIAGNOSIS — M25512 Pain in left shoulder: Secondary | ICD-10-CM

## 2016-03-25 ENCOUNTER — Ambulatory Visit (HOSPITAL_COMMUNITY)
Admission: RE | Admit: 2016-03-25 | Discharge: 2016-03-25 | Disposition: A | Discharge status: Home / Self Care | Payer: Medicare Other | Source: Ambulatory Visit | Attending: Internal Medicine | Admitting: Internal Medicine

## 2016-03-25 DIAGNOSIS — M19012 Primary osteoarthritis, left shoulder: Secondary | ICD-10-CM | POA: Diagnosis not present

## 2016-03-25 DIAGNOSIS — M25512 Pain in left shoulder: Secondary | ICD-10-CM

## 2016-03-26 ENCOUNTER — Ambulatory Visit (HOSPITAL_COMMUNITY): Payer: Medicare Other

## 2016-04-03 ENCOUNTER — Ambulatory Visit (HOSPITAL_COMMUNITY)
Admission: RE | Admit: 2016-04-03 | Discharge: 2016-04-03 | Disposition: A | Discharge status: Home / Self Care | Payer: Medicare Other | Source: Ambulatory Visit | Attending: Internal Medicine | Admitting: Internal Medicine

## 2016-04-03 DIAGNOSIS — Z1231 Encounter for screening mammogram for malignant neoplasm of breast: Secondary | ICD-10-CM | POA: Diagnosis not present

## 2016-04-23 ENCOUNTER — Encounter (HOSPITAL_COMMUNITY): Payer: Self-pay | Admitting: *Deleted

## 2016-04-23 ENCOUNTER — Emergency Department (HOSPITAL_COMMUNITY): Payer: Medicare Other

## 2016-04-23 ENCOUNTER — Emergency Department (HOSPITAL_COMMUNITY)
Admission: EM | Admit: 2016-04-23 | Discharge: 2016-04-23 | Disposition: A | Discharge status: Home / Self Care | Payer: Medicare Other | Attending: Emergency Medicine | Admitting: Emergency Medicine

## 2016-04-23 DIAGNOSIS — Z7982 Long term (current) use of aspirin: Secondary | ICD-10-CM | POA: Insufficient documentation

## 2016-04-23 DIAGNOSIS — I10 Essential (primary) hypertension: Secondary | ICD-10-CM | POA: Insufficient documentation

## 2016-04-23 DIAGNOSIS — Z7984 Long term (current) use of oral hypoglycemic drugs: Secondary | ICD-10-CM | POA: Insufficient documentation

## 2016-04-23 DIAGNOSIS — W1839XA Other fall on same level, initial encounter: Secondary | ICD-10-CM | POA: Insufficient documentation

## 2016-04-23 DIAGNOSIS — M79602 Pain in left arm: Secondary | ICD-10-CM | POA: Diagnosis not present

## 2016-04-23 DIAGNOSIS — Y929 Unspecified place or not applicable: Secondary | ICD-10-CM | POA: Insufficient documentation

## 2016-04-23 DIAGNOSIS — Y999 Unspecified external cause status: Secondary | ICD-10-CM | POA: Insufficient documentation

## 2016-04-23 DIAGNOSIS — M25512 Pain in left shoulder: Secondary | ICD-10-CM | POA: Diagnosis not present

## 2016-04-23 DIAGNOSIS — Y939 Activity, unspecified: Secondary | ICD-10-CM | POA: Diagnosis not present

## 2016-04-23 DIAGNOSIS — Z79899 Other long term (current) drug therapy: Secondary | ICD-10-CM | POA: Insufficient documentation

## 2016-04-23 DIAGNOSIS — E119 Type 2 diabetes mellitus without complications: Secondary | ICD-10-CM | POA: Diagnosis not present

## 2016-04-23 DIAGNOSIS — S4992XA Unspecified injury of left shoulder and upper arm, initial encounter: Secondary | ICD-10-CM | POA: Diagnosis not present

## 2016-04-23 DIAGNOSIS — Z85820 Personal history of malignant melanoma of skin: Secondary | ICD-10-CM | POA: Insufficient documentation

## 2016-04-23 MED ORDER — IBUPROFEN 400 MG PO TABS
400.0000 mg | ORAL_TABLET | Freq: Once | ORAL | Status: AC
  Administered 2016-04-23: 400 mg via ORAL
  Filled 2016-04-23: qty 1

## 2016-04-23 MED ORDER — NAPROXEN 500 MG PO TABS: 500.0000 mg | ORAL_TABLET | Freq: Two times a day (BID) | ORAL | 0 refills | Status: DC

## 2016-04-23 NOTE — ED Provider Notes (Signed)
[facility] DEPT Provider Note   CSN: [id number] Arrival date & time: 04/23/16  6962     History   Chief Complaint Chief Complaint  Patient presents with  . Arm Pain    left    HPI Gloria Rose is a 66 y.o. female.  HPI  67 y.o. female presents to the Emergency Department today complaining of left arm pain s/p fall yesterday. Pt states she saw her PCP x several weeks ago for same injury with negative xray imaging. She was told to follow up with orthopedics. She has an appointment this coming Monday. Notes that the pain was getting better several weeks ago and then got worse after fall. Notes fall on outstretched hand and feels pain in upper arm. Pain with ROM. No pain at rest. Tylenol PRN. No meds PTA. No numbness/tingling. Rates pain 9/10 with movement. No other symptoms noted.   Past Medical History:  Diagnosis Date  . Cancer (HCC)    melanoma of toe (big toe on left foot) removed  . Depression   . Diabetes mellitus without complication ([place])    dx 7-8 yrs  type 2  . GERD (gastroesophageal reflux disease)   . Hypertension     Patient Active Problem List   Diagnosis Date Noted  . S/P total knee arthroplasty 07/03/2014    Past Surgical History:  Procedure Laterality Date  . RTR     RIGHT SHOULDER IN 2013  . TOTAL KNEE ARTHROPLASTY Left 07/03/2014   Procedure: LEFT TOTAL KNEE ARTHROPLASTY;  Surgeon: Vickey Huger, MD;  Location: [facility];  Service: Orthopedics;  Laterality: Left;    OB History    No data available       Home Medications    Prior to Admission medications   Medication Sig Start Date End Date Taking? Authorizing Provider  amLODipine (NORVASC) 5 MG tablet Take 5 mg by mouth daily.    Historical Provider, MD  atenolol (TENORMIN) 100 MG tablet Take 100 mg by mouth daily.    Historical Provider, MD  celecoxib (CELEBREX) 200 MG capsule Take 1 capsule (200 mg total) by mouth every 12 (twelve) hours. 07/04/14   Carlynn Spry, PA-C  enoxaparin (LOVENOX)  40 MG/0.4ML injection Inject 0.4 mLs (40 mg total) into the skin daily. 07/04/14   Carlynn Spry, PA-C  hydrochlorothiazide (HYDRODIURIL) 25 MG tablet Take 25 mg by mouth daily.    Historical Provider, MD  lisinopril (PRINIVIL,ZESTRIL) 20 MG tablet Take 20 mg by mouth daily.    Historical Provider, MD  metFORMIN (GLUCOPHAGE) 500 MG tablet Take 500 mg by mouth 2 (two) times daily with a meal.    Historical Provider, MD  methocarbamol (ROBAXIN) 500 MG tablet Take 1-2 tablets (500-1,000 mg total) by mouth every 6 (six) hours as needed for muscle spasms. 07/04/14   Carlynn Spry, PA-C  omeprazole (PRILOSEC) 20 MG capsule Take 20 mg by mouth daily.    Historical Provider, MD  oxyCODONE (OXY IR/ROXICODONE) 5 MG immediate release tablet Take 1-2 tablets (5-10 mg total) by mouth every 3 (three) hours as needed for breakthrough pain. 07/04/14   Carlynn Spry, PA-C  OxyCODONE (OXYCONTIN) 10 mg T12A 12 hr tablet Take 1 tablet (10 mg total) by mouth every 12 (twelve) hours. 07/04/14   Carlynn Spry, PA-C  PARoxetine (PAXIL) 40 MG tablet Take 40 mg by mouth 2 (two) times daily.     Historical Provider, MD  pravastatin (PRAVACHOL) 40 MG tablet Take 40 mg by mouth daily.    Historical  Provider, MD    Family History No family history on file.  Social History Social History  Substance Use Topics  . Smoking status: Never Smoker  . Smokeless tobacco: Never Used  . Alcohol use 1.2 oz/week    2 Glasses of wine per week     Allergies   Penicillins   Review of Systems Review of Systems  Constitutional: Negative for fever.  Gastrointestinal: Negative for nausea.  Musculoskeletal: Positive for arthralgias and myalgias.  Neurological: Negative for numbness.   Physical Exam Updated Vital Signs BP (!) 146/73 (BP Location: Right Arm)   Pulse 69   Temp 98 F (36.7 C) (Oral)   Resp 18   Ht 5\' 6"  (1.676 m)   Wt 86.2 kg   SpO2 99%   BMI 30.67 kg/m   Physical Exam  Constitutional: She is oriented to  person, place, and time. Vital signs are normal. She appears well-developed and well-nourished.  HENT:  Head: Normocephalic.  Right Ear: Hearing normal.  Left Ear: Hearing normal.  Eyes: Conjunctivae and EOM are normal. Pupils are equal, round, and reactive to light.  Cardiovascular: Normal rate and regular rhythm.   Pulmonary/Chest: Effort normal.  Musculoskeletal:  Left Arm: Shoulder and Elbow ROM intact. No pain with motion. NVI. Distal pulses appreciated. TTP along mid shaft humerus. Biceps muscle intact. Triceps muscle intact. Pain with abduction of arm. No palpable or visible deformities.  Neurological: She is alert and oriented to person, place, and time.  Skin: Skin is warm and dry.  Psychiatric: She has a normal mood and affect. Her speech is normal and behavior is normal. Thought content normal.  Nursing note and vitals reviewed.   ED Treatments / Results  Labs (all labs ordered are listed, but only abnormal results are displayed) Labs Reviewed - No data to display  EKG  EKG Interpretation None       Radiology Dg Humerus Left  Result Date: 04/23/2016 CLINICAL DATA:  Fall.  Pain. EXAM: LEFT HUMERUS - 2+ VIEW COMPARISON:  03/25/2016. FINDINGS: Acromioclavicular and glenohumeral degenerative change. No evidence of fracture dislocation. IMPRESSION: No acute abnormality . Electronically Signed   By: Marcello Moores  Register   On: 04/23/2016 09:12    Procedures Procedures (including critical care time)  Medications Ordered in ED Medications - No data to display   Initial Impression / Assessment and Plan / ED Course  I have reviewed the triage vital signs and the nursing notes.  Pertinent labs & imaging results that were available during my care of the patient were reviewed by me and considered in my medical decision making (see chart for details).  Final Clinical Impressions(s) / ED Diagnoses   {I have reviewed and evaluated the relevant imaging studies.  {I have reviewed  the relevant previous healthcare records.  {I obtained HPI from historian.   ED Course:  Assessment: Patient X-Ray negative for obvious fracture or dislocation.  Pt advised to follow up with orthopedics. Patient given sling while in ED, conservative therapy recommended and discussed. Patient will be discharged home & is agreeable with above plan. Returns precautions discussed. Pt appears safe for discharge  Disposition/Plan:  DC Home Additional Verbal discharge instructions given and discussed with patient.  Pt Instructed to f/u with Ortho in the next week for evaluation and treatment of symptoms. Return precautions given Pt acknowledges and agrees with plan  Supervising Physician Francine Graven, DO  Final diagnoses:  Left arm pain    New Prescriptions New Prescriptions   No medications  on file     Shary Decamp, PA-C 04/23/16 [place], DO 04/27/16 1651

## 2016-04-23 NOTE — ED Triage Notes (Signed)
Pt comes in with left arm pain starting several months ago. Pt had an xray done on this area (doesn't know the result). Yesterday she caught herself on that arm, now she has increased pain. Denies any chest pain.

## 2016-04-23 NOTE — Discharge Instructions (Signed)
Please read and follow all provided instructions.  Your diagnoses today include:  1. Left arm pain     Tests performed today include: Vital signs. See below for your results today.   Medications prescribed:  Take as prescribed   Home care instructions:  Follow any educational materials contained in this packet.  Follow-up instructions: Please follow-up with your orthopedic provider for further evaluation of symptoms and treatment   Return instructions:  Please return to the Emergency Department if you do not get better, if you get worse, or new symptoms OR  - Fever (temperature greater than 101.51F)  - Bleeding that does not stop with holding pressure to the area    -Severe pain (please note that you may be more sore the day after your accident)  - Chest Pain  - Difficulty breathing  - Severe nausea or vomiting  - Inability to tolerate food and liquids  - Passing out  - Skin becoming red around your wounds  - Change in mental status (confusion or lethargy)  - New numbness or weakness    Please return if you have any other emergent concerns.  Additional Information:  Your vital signs today were: BP (!) 146/73 (BP Location: Right Arm)    Pulse 69    Temp 98 F (36.7 C) (Oral)    Resp 18    Ht 5\' 6"  (1.676 m)    Wt 86.2 kg    SpO2 99%    BMI 30.67 kg/m  If your blood pressure (BP) was elevated above 135/85 this visit, please have this repeated by your doctor within one month. ---------------

## 2016-04-28 ENCOUNTER — Telehealth: Payer: Self-pay | Admitting: Orthopedic Surgery

## 2016-04-28 ENCOUNTER — Ambulatory Visit (INDEPENDENT_AMBULATORY_CARE_PROVIDER_SITE_OTHER): Payer: Medicare Other | Admitting: Orthopedic Surgery

## 2016-04-28 ENCOUNTER — Encounter: Payer: Self-pay | Admitting: Orthopedic Surgery

## 2016-04-28 VITALS — BP 136/72 | HR 71 | Ht 65.0 in | Wt 205.0 lb

## 2016-04-28 DIAGNOSIS — S46212A Strain of muscle, fascia and tendon of other parts of biceps, left arm, initial encounter: Secondary | ICD-10-CM

## 2016-04-28 DIAGNOSIS — M7502 Adhesive capsulitis of left shoulder: Secondary | ICD-10-CM

## 2016-04-28 NOTE — Progress Notes (Signed)
Patient ID: Gloria Rose, female   DOB: 1950/02/04, 66 y.o.   MRN: [id number]  Chief Complaint  Patient presents with  . Shoulder Injury    ER follow up on left shoulder, DOI 04-23-16.    HPI Gloria Rose is a 66 y.o. female.  This is a 66 year old right-hand-dominant female is retired presents with a two-month history of pain in her left shoulder which began spontaneously. She had a two-month history of pain and loss of motion which got better until she fell against her house and landed using her left arm for support and then her pain in the front of her arm over the biceps came back. She has not had any treatment. It's a dull aching pain. It is associated with loss of shoulder motion.     Review of Systems Review of Systems Normal neuro  Denies fever   Past Medical History:  Diagnosis Date  . Cancer (HCC)    melanoma of toe (big toe on left foot) removed  . Depression   . Diabetes mellitus without complication ([place])    dx 7-8 yrs  type 2  . GERD (gastroesophageal reflux disease)   . Hypertension     Past Surgical History:  Procedure Laterality Date  . RTR     RIGHT SHOULDER IN 2013  . TOTAL KNEE ARTHROPLASTY Left 07/03/2014   Procedure: LEFT TOTAL KNEE ARTHROPLASTY;  Surgeon: Vickey Huger, MD;  Location: [facility];  Service: Orthopedics;  Laterality: Left;      History reviewed. No pertinent family history. The patient was quizzed about their family history and reported no history of bleeding problems or anesthesia problems in their family  Social History Social History  Substance Use Topics  . Smoking status: Never Smoker  . Smokeless tobacco: Never Used  . Alcohol use 1.2 oz/week    2 Glasses of wine per week    Allergies  Allergen Reactions  . Penicillins Anaphylaxis and Hives    Difficulty breathing    Current Outpatient Prescriptions  Medication Sig Dispense Refill  . amLODipine (NORVASC) 5 MG tablet Take 5 mg by mouth daily.    Marland Kitchen aspirin EC 81 MG  tablet Take 81 mg by mouth daily.    Marland Kitchen atenolol (TENORMIN) 100 MG tablet Take 100 mg by mouth daily.    Marland Kitchen enoxaparin (LOVENOX) 40 MG/0.4ML injection Inject 0.4 mLs (40 mg total) into the skin daily. 13 Syringe 0  . hydrochlorothiazide (HYDRODIURIL) 25 MG tablet Take 25 mg by mouth daily.    Marland Kitchen lisinopril (PRINIVIL,ZESTRIL) 20 MG tablet Take 20 mg by mouth daily.    . metFORMIN (GLUCOPHAGE) 500 MG tablet Take 500 mg by mouth 2 (two) times daily with a meal.    . naproxen (NAPROSYN) 500 MG tablet Take 1 tablet (500 mg total) by mouth 2 (two) times daily. 30 tablet 0  . omeprazole (PRILOSEC) 20 MG capsule Take 20 mg by mouth daily.    Marland Kitchen PARoxetine (PAXIL) 40 MG tablet Take 40 mg by mouth 2 (two) times daily.     . pravastatin (PRAVACHOL) 40 MG tablet Take 40 mg by mouth daily.     No current facility-administered medications for this visit.        Physical Exam BP 136/72   Pulse 71   Ht 5\' 5"  (1.651 m)   Wt 205 lb (93 kg)   BMI 34.11 kg/m  Physical Exam The patient is well developed well nourished and well groomed.  Orientation to person  place and time is normal  Mood is pleasant.  Ambulatory status Remains normal Cervical spine exam is as follows: NORMAL rom    Ortho Exam LEFT shoulder  Examination: Inspection reveals tenderness over the biceps tendon and biceps muscle. The patient has decreased range of motion 80 FLEXION  external rotation 45 and abduction 70  grade 5/ 5 motor function of the rotator cuff internal and external rotation  Stability in abduction external rotation inot tested because of range of motion limitations   Neurovascular examination is intact and the lymph nodes in the axilla and supraclavicular regions are normal   The opposite shoulder RIGHT exhibits normal range of motion stability and strength neurovascular exam is intact, lymph nodes are negative and there is no swelling or tenderness   Yergason test was negative speed test was positive  Data  Reviewed   LEFT HUMERUS SHOULDER INCLUDED  FINDINGS: Acromioclavicular and glenohumeral degenerative change. No evidence of fracture dislocation.   IMPRESSION: No acute abnormality .     Electronically Signed   By: Marcello Moores  Register   On: 04/23/2016 09:12    IMAGING: Left humerus and shoulder show no gross abnormality Shoulder: I have read and interpret the x-ray as followAP lateral left humerus normal humerus normal shoulder joint Assessment   left shoulder biceps tendon strain and adhesive capsulitis  Encounter Diagnoses  Name Primary?  . Strain of left biceps, initial encounter Yes  . Adhesive capsulitis of left shoulder     Plan  physical therapy return in 8 weeks   Arther Abbott, MD 04/28/2016 10:30 AM

## 2016-04-28 NOTE — Telephone Encounter (Signed)
RETURNED CALL, NO ANSWER, LEFT VM

## 2016-04-28 NOTE — Telephone Encounter (Signed)
Patient has question for nurse regarding today's office visit for left shoulder  -  Phone # [phone].

## 2016-04-29 ENCOUNTER — Encounter: Payer: Self-pay | Admitting: Orthopedic Surgery

## 2016-05-02 ENCOUNTER — Encounter (HOSPITAL_COMMUNITY): Payer: Self-pay | Admitting: Occupational Therapy

## 2016-05-02 ENCOUNTER — Ambulatory Visit (HOSPITAL_COMMUNITY): Payer: Medicare Other | Attending: Orthopedic Surgery | Admitting: Occupational Therapy

## 2016-05-02 DIAGNOSIS — M25612 Stiffness of left shoulder, not elsewhere classified: Secondary | ICD-10-CM | POA: Insufficient documentation

## 2016-05-02 DIAGNOSIS — M79622 Pain in left upper arm: Secondary | ICD-10-CM | POA: Diagnosis not present

## 2016-05-02 DIAGNOSIS — R29898 Other symptoms and signs involving the musculoskeletal system: Secondary | ICD-10-CM | POA: Insufficient documentation

## 2016-05-02 DIAGNOSIS — M25512 Pain in left shoulder: Secondary | ICD-10-CM | POA: Insufficient documentation

## 2016-05-02 NOTE — Patient Instructions (Signed)
SHOULDER: Flexion On Table   Place hands on table, elbows straight. Move hips away from body. Press hands down into table.  10-15___ reps per set, __2-3_ sets per day  Abduction (Passive)   With arm out to side, resting on table, lower head toward arm, keeping trunk away from table.  Repeat __10-15__ times. Do _2-3___ sessions per day.  Copyright  VHI. All rights reserved.     Internal Rotation (Assistive)   Seated with elbow bent at right angle and held against side, slide arm on table surface in an inward arc. Repeat _10-15___ times. Do _2-3___ sessions per day. Activity: Use this motion to brush crumbs off the table.  Copyright  VHI. All rights reserved.     1) Seated Row   Sit up straight with elbows by your sides. Pull back with shoulders/elbows, keeping forearms straight, as if pulling back on the reins of a horse. Squeeze shoulder blades together. Repeat _10-15__times, __1-3__sets/day    2) Shoulder Elevation    Sit up straight with arms by your sides. Slowly bring your shoulders up towards your ears. Repeat_10-15__times, __1-3__ sets/day    3) Shoulder Extension    Sit up straight with both arms by your side, draw your arms back behind your waist. Keep your elbows straight. Repeat _10-15___times, __1-3__sets/day.

## 2016-05-02 NOTE — Therapy (Signed)
[facility] [place], [place], [address] Phone: [phone]   Fax:  [phone]  Occupational Therapy Evaluation  Patient Details  Name: Gloria Rose MRN: [id number] Date of Birth: [birth year] Referring Provider: Dr. Arther Abbott  Encounter Date: 05/02/2016      OT End of Session - 05/02/16 1355    Visit Number 1   Number of Visits 4   Date for OT Re-Evaluation 05/30/16   Authorization Type [employer]   Authorization Time Period Before 10th visit   Authorization - Visit Number 1   Authorization - Number of Visits 10   OT Start Time 1033   OT Stop Time 1108   OT Time Calculation (min) 35 min   Activity Tolerance Patient tolerated treatment well   Behavior During Therapy [facility] for tasks assessed/performed      Past Medical History:  Diagnosis Date  . Cancer (HCC)    melanoma of toe (big toe on left foot) removed  . Depression   . Diabetes mellitus without complication ([place])    dx 7-8 yrs  type 2  . GERD (gastroesophageal reflux disease)   . Hypertension     Past Surgical History:  Procedure Laterality Date  . RTR     RIGHT SHOULDER IN 2013  . TOTAL KNEE ARTHROPLASTY Left 07/03/2014   Procedure: LEFT TOTAL KNEE ARTHROPLASTY;  Surgeon: Vickey Huger, MD;  Location: [facility];  Service: Orthopedics;  Laterality: Left;    There were no vitals filed for this visit.      Subjective Assessment - 05/02/16 1358    Subjective  S: I think I hurt it when I was helping lift a cage   Pertinent History Pt is a 66 y/o female presenting with left bicep strain and adhesive capsulitis. Pt injured shoulder in mid-February and has noticed slight improvement since original injury. Pt was referred to occupational therapy for evaluation and treatment by Dr. Arther Abbott.    Patient Stated Goals To be able to use my left arm.    Currently in Pain? No/denies           [facility] OT Assessment - 05/02/16 1032      Assessment   Diagnosis Left bicep strain & adhesive capsulitis    Referring Provider Dr. Arther Abbott   Onset Date 02/27/16  approximately   Prior Therapy None     Precautions   Precautions None     Restrictions   Weight Bearing Restrictions No     Balance Screen   Has the patient fallen in the past 6 months No   Has the patient had a decrease in activity level because of a fear of falling?  No   Is the patient reluctant to leave their home because of a fear of falling?  No     Prior Function   Level of Independence Independent   Vocation Retired   Leisure reading, outdoor work-gardening, flowers     ADL   ADL comments Pt is having difficulty with dressing, grooming, washing and fixing hair, reaching overhead     Written Expression   Dominant Hand Right     Cognition   Overall Cognitive Status Within Functional Limits for tasks assessed     ROM / Strength   AROM / PROM / Strength AROM;PROM;Strength     Palpation   Palpation comment Moderate fascial restrictions along upper arm, trapezius, and scapularis regions     AROM   Overall AROM Comments  Assessed seated, er/IR adducted   AROM Assessment Site Shoulder   Right/Left Shoulder Left   Left Shoulder Flexion 52 Degrees   Left Shoulder ABduction 65 Degrees   Left Shoulder Internal Rotation 90 Degrees   Left Shoulder External Rotation 10 Degrees     PROM   Overall PROM Comments Assessed supine, er/IR adducted   PROM Assessment Site Shoulder   Right/Left Shoulder Left   Left Shoulder Flexion 158 Degrees   Left Shoulder ABduction 115 Degrees   Left Shoulder Internal Rotation 90 Degrees   Left Shoulder External Rotation 15 Degrees     Strength   Overall Strength Comments Assessed seated, er/IR adducted   Strength Assessment Site Shoulder;Elbow   Right/Left Shoulder Left   Left Shoulder Flexion 3-/5   Left Shoulder ABduction 3/5   Left Shoulder Internal Rotation 3/5   Left Shoulder External Rotation 3-/5   Right/Left  Elbow Left   Left Elbow Flexion 3/5   Left Elbow Extension 3/5                         OT Education - 05/02/16 1355    Education provided Yes   Education Details scapular A/ROM, table slides   Person(s) Educated Patient   Methods Explanation;Demonstration;Handout   Comprehension Verbalized understanding;Returned demonstration          OT Short Term Goals - 05/02/16 1405      OT SHORT TERM GOAL #1   Title Pt will be educated on HEP to improve functional use of LUE during daily tasks.    Time 4   Period Weeks   Status New     OT SHORT TERM GOAL #2   Title Pt will decrease fascial restrictions in LUE from mod to min amounts to improve mobility required for overhead reaching.    Time 4   Period Weeks   Status New     OT SHORT TERM GOAL #3   Title Pt will decrease LUE pain to 2/10 or less to improve use of LUE during daily tasks.    Time 4   Period Weeks   Status New     OT SHORT TERM GOAL #4   Title Pt will improve LUE A/ROM to [facility] to increase ability to wash and fix hair.    Time 4   Period Weeks   Status New     OT SHORT TERM GOAL #5   Title Pt will increase LUE strength to 4/5 to improve ability to complete gardening tasks.    Time 4   Period Weeks   Status New                  Plan - 05/02/16 1402    Clinical Impression Statement A: pt is a 66 y/o female presenting with left adhesive capsulitis and bicep tendon strain. Pt reporting pain and weakness limiting functional use of LUE during daily tasks. Pt provided with table slide and scapular A/ROM HEP.    Rehab Potential Good   OT Frequency 1x / week   OT Duration 4 weeks   OT Treatment/Interventions Self-care/ADL training;Therapeutic exercise;Patient/family education;Ultrasound;Manual Therapy;Cryotherapy;Therapeutic activities;Moist Heat;Electrical Stimulation;Passive range of motion   Plan P: Pt will benefit from skilled OT services to decrease pain and fascial restrictions,  increase ROM and strength of LUE to improve functional use of LUE. Treatment plan: myofasical release, manual therapy, P/ROM, AA/ROM, A/ROM, general LUE strengthening, modalities as needed.    OT Home Exercise Plan  4/26: scapular A/ROM, table slides   Consulted and Agree with Plan of Care Patient      Patient will benefit from skilled therapeutic intervention in order to improve the following deficits and impairments:  Decreased activity tolerance, Decreased strength, Impaired flexibility, Decreased range of motion, Pain, Increased fascial restricitons, Impaired UE functional use  Visit Diagnosis: Acute pain of left shoulder  Pain in left upper arm  Other symptoms and signs involving the musculoskeletal system  Stiffness of left shoulder, not elsewhere classified      G-Codes - 05/24/2016 1407    Functional Assessment Tool Used (Outpatient only) Clinical judgement   Functional Limitation Carrying, moving and handling objects   Carrying, Moving and Handling Objects Current Status (I7185) At least 40 percent but less than 60 percent impaired, limited or restricted   Carrying, Moving and Handling Objects Goal Status (B0158) At least 20 percent but less than 40 percent impaired, limited or restricted      Problem List Patient Active Problem List   Diagnosis Date Noted  . S/P total knee arthroplasty 07/03/2014   Guadelupe Sabin, OTR/L  [phone] 24-May-2016, 2:09 PM  [facility] [address] [place], [place], [address] Phone: [phone]   Fax:  [phone]  Name: Gloria Rose MRN: [id number] Date of Birth: 06-23-1950

## 2016-05-05 DIAGNOSIS — E119 Type 2 diabetes mellitus without complications: Secondary | ICD-10-CM | POA: Diagnosis not present

## 2016-05-09 ENCOUNTER — Encounter (HOSPITAL_COMMUNITY): Payer: Self-pay | Admitting: Occupational Therapy

## 2016-05-09 ENCOUNTER — Ambulatory Visit (HOSPITAL_COMMUNITY): Payer: Medicare Other | Attending: Orthopedic Surgery | Admitting: Occupational Therapy

## 2016-05-09 DIAGNOSIS — R29898 Other symptoms and signs involving the musculoskeletal system: Secondary | ICD-10-CM | POA: Diagnosis not present

## 2016-05-09 DIAGNOSIS — M79622 Pain in left upper arm: Secondary | ICD-10-CM | POA: Diagnosis not present

## 2016-05-09 DIAGNOSIS — M25612 Stiffness of left shoulder, not elsewhere classified: Secondary | ICD-10-CM | POA: Insufficient documentation

## 2016-05-09 DIAGNOSIS — M25512 Pain in left shoulder: Secondary | ICD-10-CM | POA: Diagnosis not present

## 2016-05-09 NOTE — Patient Instructions (Signed)
Perform each exercise ___10_____ reps. 1-3x days.   Protraction - STANDING  Start by holding a wand or cane at chest height.  Next, slowly push the wand outwards in front of your body so that your elbows become fully straightened. Then, return to the original position.     Shoulder FLEXION - STANDING - PALMS UP  In the standing position, hold a wand/cane with both arms, palms up on both sides. Raise up the wand/cane allowing your unaffected arm to perform most of the effort. Your affected arm should be partially relaxed.      Internal/External ROTATION - STANDING  In the standing position, hold a wand/cane with both hands keeping your elbows bent. Move your arms and wand/cane to one side.  Your affected arm should be partially relaxed while your unaffected arm performs most of the effort.       Shoulder ABDUCTION - STANDING  While holding a wand/cane palm face up on the injured side and palm face down on the uninjured side, slowly raise up your injured arm to the side.                     Horizontal Abduction/Adduction      Straight arms holding cane at shoulder height, bring cane to right, center, left. Repeat starting to left.   Copyright  VHI. All rights reserved.

## 2016-05-09 NOTE — Therapy (Signed)
[place], [place], [address] Phone: [phone]   Fax:  [phone]  Occupational Therapy Treatment  Patient Details  Name: Gloria Rose MRN: [id number] Date of Birth: July 26, 1950 Referring Provider: Dr. Arther Abbott  Encounter Date: 05/09/2016      OT End of Session - 05/09/16 1122    Visit Number 2   Number of Visits 4   Date for OT Re-Evaluation 05/30/16   Authorization Type [employer] Medicare   Authorization Time Period Before 10th visit   Authorization - Visit Number 2   Authorization - Number of Visits 10   OT Start Time 1032   OT Stop Time 1115   OT Time Calculation (min) 43 min   Activity Tolerance Patient tolerated treatment well   Behavior During Therapy [facility] for tasks assessed/performed      Past Medical History:  Diagnosis Date  . Cancer (HCC)    melanoma of toe (big toe on left foot) removed  . Depression   . Diabetes mellitus without complication ([place])    dx 7-8 yrs  type 2  . GERD (gastroesophageal reflux disease)   . Hypertension     Past Surgical History:  Procedure Laterality Date  . RTR     RIGHT SHOULDER IN 2013  . TOTAL KNEE ARTHROPLASTY Left 07/03/2014   Procedure: LEFT TOTAL KNEE ARTHROPLASTY;  Surgeon: Vickey Huger, MD;  Location: [place];  Service: Orthopedics;  Laterality: Left;    There were no vitals filed for this visit.      Subjective Assessment - 05/09/16 1030    Subjective  S: It was pretty good until I mowed my mother-in-law's yard.    Currently in Pain? No/denies            [facility] OT Assessment - 05/09/16 1030      Assessment   Diagnosis Left bicep strain & adhesive capsulitis      Precautions   Precautions None                  OT Treatments/Exercises (OP) - 05/09/16 1035      Exercises   Exercises Shoulder     Shoulder Exercises: Supine   Protraction PROM;AAROM;5 reps   Horizontal ABduction PROM;AAROM;10 reps   External Rotation  PROM;AAROM;10 reps   Internal Rotation PROM;AAROM;10 reps   Flexion PROM;AAROM;10 reps   ABduction PROM;AAROM;10 reps     Shoulder Exercises: Seated   Elevation AROM;10 reps   Extension AROM;10 reps   Row AROM;10 reps   External Rotation AAROM;10 reps   Internal Rotation AAROM;10 reps     Shoulder Exercises: Pulleys   Flexion 1 minute   ABduction 1 minute     Shoulder Exercises: Therapy Ball   Flexion 10 reps   ABduction 10 reps     Manual Therapy   Manual Therapy Myofascial release   Manual therapy comments Completed separately from therapeutic exercises   Myofascial Release Myofasical release to left upper arm, trapezius, and scapularis regions to decrease pain and fascial restrictions and improve joint range of motion.                 OT Education - 05/09/16 1120    Education provided Yes   Education Details AA/ROM exercises   Person(s) Educated Patient   Methods Explanation;Demonstration;Handout   Comprehension Verbalized understanding;Returned demonstration          OT Short Term Goals - 05/09/16 1122      OT  SHORT TERM GOAL #1   Status On-going     OT SHORT TERM GOAL #2   Status On-going     OT SHORT TERM GOAL #3   Status On-going     OT SHORT TERM GOAL #4   Status On-going     OT SHORT TERM GOAL #5   Status On-going                  Plan - 05/09/16 1123    Clinical Impression Statement A: Initiated myofascial release, manual therapy, P/ROM, AA/ROM, scapular A/ROM, and therapy ball exercises. Pt with mod difficulty with AA/ROM due to pain in bicep, good form. During scapular A/ROM row exercise OT notes significant internal rotation of LUE in row position. Pt unable to correct to hold forearm in neutral, also note weak supinators during session.    Plan P: Follow up on HEP, er strengthening and supination strengthening, continue with AA/ROM and progress to A/ROM when appropriate.    OT Home Exercise Plan 4/26: scapular A/ROM, table  slides; 5/4: AA/ROM   Consulted and Agree with Plan of Care Patient      Patient will benefit from skilled therapeutic intervention in order to improve the following deficits and impairments:  Decreased activity tolerance, Decreased strength, Impaired flexibility, Decreased range of motion, Pain, Increased fascial restricitons, Impaired UE functional use  Visit Diagnosis: Acute pain of left shoulder  Pain in left upper arm  Other symptoms and signs involving the musculoskeletal system    Problem List Patient Active Problem List   Diagnosis Date Noted  . S/P total knee arthroplasty 07/03/2014   Raimi Guillermo, OTR/L  [phone] 05/09/2016, 11:26 AM  [facility] [address] [place], [place], [address] Phone: [phone]   Fax:  [phone]  Name: Gloria Rose MRN: [id number] Date of Birth: July 01, 1950

## 2016-05-16 ENCOUNTER — Encounter (HOSPITAL_COMMUNITY): Payer: Self-pay | Admitting: Occupational Therapy

## 2016-05-16 ENCOUNTER — Ambulatory Visit (HOSPITAL_COMMUNITY): Payer: Medicare Other | Admitting: Occupational Therapy

## 2016-05-16 DIAGNOSIS — M25512 Pain in left shoulder: Secondary | ICD-10-CM

## 2016-05-16 DIAGNOSIS — R29898 Other symptoms and signs involving the musculoskeletal system: Secondary | ICD-10-CM

## 2016-05-16 DIAGNOSIS — M25612 Stiffness of left shoulder, not elsewhere classified: Secondary | ICD-10-CM | POA: Diagnosis not present

## 2016-05-16 DIAGNOSIS — M79622 Pain in left upper arm: Secondary | ICD-10-CM | POA: Diagnosis not present

## 2016-05-16 NOTE — Therapy (Signed)
[place], [place], [address] Phone: [phone]   Fax:  [phone]  Occupational Therapy Treatment  Patient Details  Name: Gloria Rose MRN: [id number] Date of Birth: May 25, 1950 Referring Provider: Dr. Arther Abbott  Encounter Date: 05/16/2016      OT End of Session - 05/16/16 1126    Visit Number 3   Number of Visits 4   Date for OT Re-Evaluation 05/30/16   Authorization Type [employer] Medicare   Authorization Time Period Before 10th visit   Authorization - Visit Number 3   Authorization - Number of Visits 10   OT Start Time 1032   OT Stop Time 1115   OT Time Calculation (min) 43 min   Activity Tolerance Patient tolerated treatment well   Behavior During Therapy [facility] for tasks assessed/performed      Past Medical History:  Diagnosis Date  . Cancer (HCC)    melanoma of toe (big toe on left foot) removed  . Depression   . Diabetes mellitus without complication ([place])    dx 7-8 yrs  type 2  . GERD (gastroesophageal reflux disease)   . Hypertension     Past Surgical History:  Procedure Laterality Date  . RTR     RIGHT SHOULDER IN 2013  . TOTAL KNEE ARTHROPLASTY Left 07/03/2014   Procedure: LEFT TOTAL KNEE ARTHROPLASTY;  Surgeon: Vickey Huger, MD;  Location: [place];  Service: Orthopedics;  Laterality: Left;    There were no vitals filed for this visit.      Subjective Assessment - 05/16/16 1032    Subjective  S: It is feeling better today.    Currently in Pain? No/denies            [facility] OT Assessment - 05/16/16 1031      Assessment   Diagnosis Left bicep strain & adhesive capsulitis      Precautions   Precautions None                  OT Treatments/Exercises (OP) - 05/16/16 1035      Exercises   Exercises Shoulder;Wrist     Shoulder Exercises: Supine   Protraction PROM;5 reps;AAROM;10 reps   Horizontal ABduction PROM;5 reps;AAROM;10 reps   External Rotation PROM;5  reps;AAROM;10 reps   Internal Rotation PROM;5 reps;AAROM;10 reps   Flexion PROM;AAROM;5 reps   ABduction PROM;5 reps;AAROM;10 reps     Shoulder Exercises: Therapy Ball   Flexion 10 reps   ABduction 10 reps     Weighted Stretch Over Towel Roll   Supination - Weighted Stretch 2 pounds;30 seconds  2 reps     Manual Therapy   Manual Therapy Myofascial release   Manual therapy comments Completed separately from therapeutic exercises   Myofascial Release Myofasical release to left upper arm, trapezius, and scapularis regions to decrease pain and fascial restrictions and improve joint range of motion.                   OT Short Term Goals - 05/16/16 1130      OT SHORT TERM GOAL #1   Title Pt will be educated on HEP to improve functional use of LUE during daily tasks.    Time 4   Period Weeks   Status On-going     OT SHORT TERM GOAL #2   Title Pt will decrease fascial restrictions in LUE from mod to min amounts to improve mobility required for overhead reaching.  Time 4   Period Weeks   Status On-going     OT SHORT TERM GOAL #3   Title Pt will decrease LUE pain to 2/10 or less to improve use of LUE during daily tasks.    Time 4   Period Weeks   Status On-going     OT SHORT TERM GOAL #4   Title Pt will improve LUE A/ROM to [facility] to increase ability to wash and fix hair.    Time 4   Period Weeks   Status New     OT SHORT TERM GOAL #5   Title Pt will increase LUE strength to 4/5 to improve ability to complete gardening tasks.    Time 4   Period Weeks   Status On-going                  Plan - 05/16/16 1126    Clinical Impression Statement A: Pt reporting improved A/ROM today, however unable to complete all AA/ROM exercises in supine due to increased pain at bicep tendon. Added weighted supination stretch, pt with improved active supination after weigthed stretch.    Plan P: Continue with P/ROM and AA/ROM within pt's pain tolerance, add thumb tacks and  wall wash   OT Home Exercise Plan 4/26: scapular A/ROM, table slides; 5/4: AA/ROM   Consulted and Agree with Plan of Care Patient      Patient will benefit from skilled therapeutic intervention in order to improve the following deficits and impairments:  Decreased activity tolerance, Decreased strength, Impaired flexibility, Decreased range of motion, Pain, Increased fascial restricitons, Impaired UE functional use  Visit Diagnosis: Acute pain of left shoulder  Pain in left upper arm  Other symptoms and signs involving the musculoskeletal system    Problem List Patient Active Problem List   Diagnosis Date Noted  . S/P total knee arthroplasty 07/03/2014   Timmie Calix, OTR/L  [phone] 05/16/2016, 11:31 AM  [facility] [address] [place], [place], [address] Phone: [phone]   Fax:  [phone]  Name: Gloria Rose MRN: [id number] Date of Birth: [birth year]

## 2016-05-23 ENCOUNTER — Ambulatory Visit (HOSPITAL_COMMUNITY): Payer: Medicare Other | Admitting: Occupational Therapy

## 2016-05-28 ENCOUNTER — Encounter (HOSPITAL_COMMUNITY): Payer: Self-pay | Admitting: Occupational Therapy

## 2016-05-28 ENCOUNTER — Ambulatory Visit (HOSPITAL_COMMUNITY): Payer: Medicare Other | Admitting: Occupational Therapy

## 2016-05-28 DIAGNOSIS — M25612 Stiffness of left shoulder, not elsewhere classified: Secondary | ICD-10-CM | POA: Diagnosis not present

## 2016-05-28 DIAGNOSIS — R29898 Other symptoms and signs involving the musculoskeletal system: Secondary | ICD-10-CM

## 2016-05-28 DIAGNOSIS — M79622 Pain in left upper arm: Secondary | ICD-10-CM

## 2016-05-28 DIAGNOSIS — M25512 Pain in left shoulder: Secondary | ICD-10-CM

## 2016-05-28 NOTE — Therapy (Signed)
[place], [place], [address] Phone: [phone]   Fax:  [phone]  Occupational Therapy Reassessment and Treatment  Patient Details  Name: Gloria Rose MRN: [id number] Date of Birth: [birth year] Referring Provider: Dr. Arther Abbott  Encounter Date: 05/28/2016      OT End of Session - 05/28/16 1126    Visit Number 4   Number of Visits 4   Date for OT Re-Evaluation 05/30/16   Authorization Type [employer] Medicare   Authorization Time Period Before 10th visit   Authorization - Visit Number 4   Authorization - Number of Visits 10   OT Start Time [phone]   OT Stop Time 1006   OT Time Calculation (min) 16 min   Activity Tolerance Patient tolerated treatment well   Behavior During Therapy [facility] for tasks assessed/performed      Past Medical History:  Diagnosis Date  . Cancer (HCC)    melanoma of toe (big toe on left foot) removed  . Depression   . Diabetes mellitus without complication ([place])    dx 7-8 yrs  type 2  . GERD (gastroesophageal reflux disease)   . Hypertension     Past Surgical History:  Procedure Laterality Date  . RTR     RIGHT SHOULDER IN 2013  . TOTAL KNEE ARTHROPLASTY Left 07/03/2014   Procedure: LEFT TOTAL KNEE ARTHROPLASTY;  Surgeon: Vickey Huger, MD;  Location: [place];  Service: Orthopedics;  Laterality: Left;    There were no vitals filed for this visit.      Subjective Assessment - 05/28/16 1117    Subjective  S: I just don't know why it's still painful.    Currently in Pain? Yes   Pain Score 4    Pain Location Shoulder   Pain Orientation Left   Pain Descriptors / Indicators Sore;Cramping  with movement   Pain Type Acute pain   Pain Radiating Towards neck   Pain Onset 1 to 4 weeks ago   Pain Frequency Intermittent   Aggravating Factors  movement   Pain Relieving Factors rest   Effect of Pain on Daily Activities min effect on ADL completion   Multiple Pain Sites No             [facility] OT Assessment - 05/28/16 0952      Assessment   Diagnosis Left bicep strain & adhesive capsulitis      Precautions   Precautions None     Palpation   Palpation comment Moderate fascial restrictions along upper arm, trapezius, and scapularis regions     AROM   Overall AROM Comments Assessed seated, er/IR adducted   AROM Assessment Site Shoulder   Right/Left Shoulder Left   Left Shoulder Flexion 140 Degrees  52 previous   Left Shoulder ABduction 130 Degrees  65 previous   Left Shoulder Internal Rotation 90 Degrees  same as previous   Left Shoulder External Rotation 10 Degrees  same as previous     PROM   Overall PROM Comments Assessed supine, er/IR adducted   PROM Assessment Site Shoulder   Right/Left Shoulder Left   Left Shoulder Flexion 158 Degrees  same as previous   Left Shoulder ABduction 180 Degrees  115 previous   Left Shoulder Internal Rotation 90 Degrees  same as previous   Left Shoulder External Rotation 41 Degrees  15 previous     Strength   Overall Strength Comments Assessed seated, er/IR adducted   Strength Assessment Site Shoulder;Elbow  Right/Left Shoulder Left   Left Shoulder Flexion 3/5  3-/5 previous   Left Shoulder ABduction 3/5  same as previous   Left Shoulder Internal Rotation 3+/5  3/5 previous   Left Shoulder External Rotation 3-/5  same as previous   Right/Left Elbow Left   Left Elbow Flexion 3+/5  3/5 previous   Left Elbow Extension 3+/5  3/5 previous                  OT Treatments/Exercises (OP) - 05/28/16 1013      Exercises   Exercises Shoulder;Wrist     Shoulder Exercises: Supine   Protraction PROM;5 reps   Horizontal ABduction PROM;5 reps   External Rotation PROM;5 reps   Internal Rotation PROM;5 reps   Flexion PROM;5 reps   ABduction PROM;5 reps     Shoulder Exercises: Seated   Protraction AAROM;10 reps   Horizontal ABduction AAROM;5 reps   External Rotation AAROM;10 reps   Internal  Rotation AAROM;10 reps   Flexion AAROM;10 reps   Abduction AAROM;10 reps     Shoulder Exercises: Stretch   Corner Stretch 2 reps;10 seconds   Cross Chest Stretch 2 reps;10 seconds   Internal Rotation Stretch 2 reps  10 seconds   Wall Stretch - Flexion 2 reps;10 seconds     Manual Therapy   Manual Therapy Myofascial release   Manual therapy comments Completed separately from therapeutic exercises   Myofascial Release Myofasical release to left upper arm, trapezius, and scapularis regions to decrease pain and fascial restrictions and improve joint range of motion.                 OT Education - 05/28/16 1125    Education provided Yes   Education Details shoulder stretches   Person(s) Educated Patient   Methods Explanation;Demonstration;Handout   Comprehension Verbalized understanding;Returned demonstration          OT Short Term Goals - 05/28/16 1126      OT SHORT TERM GOAL #1   Title Pt will be educated on HEP to improve functional use of LUE during daily tasks.    Time 4   Period Weeks   Status Achieved     OT SHORT TERM GOAL #2   Title Pt will decrease fascial restrictions in LUE from mod to min amounts to improve mobility required for overhead reaching.    Time 4   Period Weeks   Status Not Met     OT SHORT TERM GOAL #3   Title Pt will decrease LUE pain to 2/10 or less to improve use of LUE during daily tasks.    Time 4   Period Weeks   Status Not Met     OT SHORT TERM GOAL #4   Title Pt will improve LUE A/ROM to [facility] to increase ability to wash and fix hair.    Time 4   Period Weeks   Status Partially Met     OT SHORT TERM GOAL #5   Title Pt will increase LUE strength to 4/5 to improve ability to complete gardening tasks.    Time 4   Period Weeks   Status Not Met                  Plan - 05/28/16 1126    Clinical Impression Statement A: Reassessment completed this session, pt has met 1/5 STGs and partially met an additional STG. Pt  has made improvements in ROM and slight improvement in pain and strength.  Pt is now able to wash her hair and complete gardening tasks at waist level. Pt contiues to have pain at [facility] joint and along left upper arm and bicep regions, experiences catching sensations intermittently during functional use. OT notes fascial restrictions along anterior deltoid and upper arm, tenderness increases around [facility] joint. Pt would like to continue HEP and hold therapy visits until after next MD appt in June.    Plan P: Hold therapy until after MD visit to determine if pt is to continue therapy or if additional testing will be completed.    OT Home Exercise Plan 4/26: scapular A/ROM, table slides; 5/4: AA/ROM; 5/23: shoulder stretches   Consulted and Agree with Plan of Care Patient      Patient will benefit from skilled therapeutic intervention in order to improve the following deficits and impairments:  Decreased activity tolerance, Decreased strength, Impaired flexibility, Decreased range of motion, Pain, Increased fascial restricitons, Impaired UE functional use  Visit Diagnosis: Acute pain of left shoulder  Pain in left upper arm  Other symptoms and signs involving the musculoskeletal system  Stiffness of left shoulder, not elsewhere classified    Problem List Patient Active Problem List   Diagnosis Date Noted  . S/P total knee arthroplasty 07/03/2014   Jini Horiuchi, OTR/L  [phone] 05/28/2016, 11:31 AM  [facility] [address] [place], [place], [address] Phone: [phone]   Fax:  [phone]  Name: JANN MILKOVICH MRN: [id number] Date of Birth: Nov 10, 1950

## 2016-05-28 NOTE — Patient Instructions (Signed)
  1) Flexion Wall Stretch    Face wall, place affected handon wall in front of you. Slide hand up the wall  and lean body in towards the wall. Hold for 5 seconds. Repeat 3-5 times. 1-2 times/day.     2) Towel Stretch with Internal Rotation   Gently pull up your affected arm  behind your back with the assist of a towel. Hold 5 seconds. Repeat 3-5X            3) Corner Stretch    Stand at a corner of a wall, place your arms on the walls with elbows bent. Lean into the corner until a stretch is felt along the front of your chest and/or shoulders. Hold for 5 seconds. Repeat 3-5X, 1-2 times/day.    4) Posterior Capsule Stretch    Bring the involved arm across chest. Grasp elbow and pull toward chest until you feel a stretch in the back of the upper arm and shoulder. Hold 5 seconds. Repeat 3-5X. Complete 1-2 times/day.    5) Scapular Retraction    Tuck chin back as you pinch shoulder blades together.  Hold 5 seconds. Repeat 3-5X. Complete 1-2 times/day.    6) External Rotation Stretch:     Place your affected hand on the wall with the elbow bent and gently turn your body the opposite direction until a stretch is felt. Hold 5 seconds, repeat 3-5X.

## 2016-05-30 ENCOUNTER — Encounter (HOSPITAL_COMMUNITY): Payer: Medicare Other

## 2016-06-05 ENCOUNTER — Telehealth: Payer: Self-pay | Admitting: Orthopedic Surgery

## 2016-06-05 NOTE — Telephone Encounter (Signed)
sure

## 2016-06-05 NOTE — Telephone Encounter (Signed)
ROUTING TO DR HARRISON 

## 2016-06-05 NOTE — Telephone Encounter (Signed)
Gloria Rose called and stated that she is having less pain in the biceps and more pain at the top of the arm at the shoulder now and wants to know if she can get MRI.  I told her that I would send a message on the nurse and Dr. Aline Brochure for approval.

## 2016-06-06 ENCOUNTER — Other Ambulatory Visit: Payer: Self-pay | Admitting: *Deleted

## 2016-06-06 DIAGNOSIS — M25512 Pain in left shoulder: Secondary | ICD-10-CM

## 2016-06-17 ENCOUNTER — Ambulatory Visit (HOSPITAL_COMMUNITY)
Admission: RE | Admit: 2016-06-17 | Discharge: 2016-06-17 | Disposition: A | Discharge status: Home / Self Care | Payer: Medicare Other | Source: Ambulatory Visit | Attending: Orthopedic Surgery | Admitting: Orthopedic Surgery

## 2016-06-17 DIAGNOSIS — M7522 Bicipital tendinitis, left shoulder: Secondary | ICD-10-CM | POA: Diagnosis not present

## 2016-06-17 DIAGNOSIS — M25412 Effusion, left shoulder: Secondary | ICD-10-CM | POA: Insufficient documentation

## 2016-06-17 DIAGNOSIS — M659 Synovitis and tenosynovitis, unspecified: Secondary | ICD-10-CM | POA: Diagnosis not present

## 2016-06-17 DIAGNOSIS — M75122 Complete rotator cuff tear or rupture of left shoulder, not specified as traumatic: Secondary | ICD-10-CM | POA: Insufficient documentation

## 2016-06-17 DIAGNOSIS — M12812 Other specific arthropathies, not elsewhere classified, left shoulder: Secondary | ICD-10-CM | POA: Insufficient documentation

## 2016-06-17 DIAGNOSIS — M25512 Pain in left shoulder: Secondary | ICD-10-CM

## 2016-06-20 ENCOUNTER — Ambulatory Visit (INDEPENDENT_AMBULATORY_CARE_PROVIDER_SITE_OTHER): Payer: Medicare Other | Admitting: Orthopedic Surgery

## 2016-06-20 ENCOUNTER — Encounter: Payer: Self-pay | Admitting: Orthopedic Surgery

## 2016-06-20 DIAGNOSIS — M75122 Complete rotator cuff tear or rupture of left shoulder, not specified as traumatic: Secondary | ICD-10-CM

## 2016-06-20 NOTE — Progress Notes (Signed)
Patient ID: Gloria Rose, female   DOB: 1950/01/20, 66 y.o.   MRN: [id number]  Chief Complaint  Patient presents with  . Follow-up    MRI REVIEW LT Berenda Morale    This is a 66 year old right-hand-dominant female is retired presents with a two-month history of pain in her left shoulder which began spontaneously. She had a two-month history of pain and loss of motion which got better until she fell against her house and landed using her left arm for support and then her pain in the front of her arm over the biceps came back. She has not had any treatment. It's a dull aching pain. It is associated with loss of shoulder motion.     Review of Systems  Neurological: Negative for tingling.    Gen. appearance is normal grooming and hygiene normal Orientation to person place and time normal Mood normal   Ortho Exam Ortho Exam exam dated April 23, no changes LEFT shoulder  Examination: Inspection reveals tenderness over the biceps tendon and biceps muscle. The patient has decreased range of motion 80 FLEXION  external rotation 45 and abduction 70  grade 5/ 5 motor function of the rotator cuff internal and external rotation  Stability in abduction external rotation inot tested because of range of motion limitations    Neurovascular examination is intact and the lymph nodes in the axilla and supraclavicular regions are normal   A/P  Medical decision-making  MRI report IMPRESSION: 1. Complete tear of the supraspinatus and infraspinatus tendon with 3.4 cm of retraction. 2. Severe tendinosis of the subscapularis tendon. 3. Severe tendinosis of the intraarticular portion of the long head of the biceps tendon. 4. Severe arthropathy of the acromioclavicular joint. 5. Small joint effusion with mild synovitis. 6. Infraspinatus muscle edema likely reflecting muscle strain.     Electronically Signed   By: Kathreen Devoid   On: 06/17/2016 14:50   This MRI shows that the patient's teres minor  musculotendinous junction in terms of infraspinatus and there is a retracted supraspinatus tendon to the glenoid margin.  I am not confident this is repairable without a graft so we'll refer her to Dr. Marlou Sa for second opinion regarding repair of this tendon versus reverse shoulder replacement  Encounter Diagnosis  Name Primary?  . Complete rotator cuff tear or rupture of left shoulder, not specified as traumatic Yes    Arther Abbott, MD 06/20/2016 9:15 AM

## 2016-06-23 ENCOUNTER — Encounter (INDEPENDENT_AMBULATORY_CARE_PROVIDER_SITE_OTHER): Payer: Self-pay | Admitting: Orthopedic Surgery

## 2016-06-23 ENCOUNTER — Ambulatory Visit (INDEPENDENT_AMBULATORY_CARE_PROVIDER_SITE_OTHER): Payer: Medicare Other | Admitting: Orthopedic Surgery

## 2016-06-23 DIAGNOSIS — M25512 Pain in left shoulder: Secondary | ICD-10-CM

## 2016-06-26 NOTE — Progress Notes (Signed)
Office Visit Note   Patient: Gloria Rose           Date of Birth: [birth year]           MRN: [id number] Visit Date: 06/23/2016 Requested by: Celene Squibb, MD [address] [place], [place] [address] PCP: Celene Squibb, MD  Subjective: Chief Complaint  Patient presents with  . Left Shoulder - Pain    HPI: Gloria Rose is a 66 year old female with left shoulder pain.  She is unsure of her mechanism of injury.  She states she's had pain now for several months and is getting worse.  She is right-hand dominant.  She is seen Dr. Aline Brochure in [place] and was referred here for another opinion.  She went to physical therapy for a few weeks with minimal relief.  She's had an MRI scan which shows biceps tendinopathy along with a fairly significant rotator cuff tear involving if status and supraspinatus.  She's had slightly less than 6 months of symptoms.  She does have a history of right shoulder rotator cuff repair.  This pain does wake her from sleep at night.              ROS: All systems reviewed are negative as they relate to the chief complaint within the history of present illness.  Patient denies  fevers or chills.   Assessment & Plan: Visit Diagnoses:  1. Left shoulder pain, unspecified chronicity     Plan: Impression is left shoulder rotator cuff tear and biceps tendinopathy.  Plan left shoulder arthroscopy biceps tendon release limited debridement of the superior labrum with mini open rotator cuff tear repair attempts along with biceps tenodesis.  Risks and benefits are discussed with the patient including but limited to infection or vessel damage incomplete healing incomplete pain relief as well as possibility of incomplete restoration of function.  Currently her function is fairly limited in terms of strength.  Patient understands the risks and benefits of surgical intervention.  All questions answered.  Follow-Up Instructions: No Follow-up on file.   Orders:  No orders of the  defined types were placed in this encounter.  No orders of the defined types were placed in this encounter.     Procedures: No procedures performed   Clinical Data: No additional findings.  Objective: Vital Signs: There were no vitals taken for this visit.  Physical Exam:   Constitutional: Patient appears well-developed HEENT:  Head: Normocephalic Eyes:EOM are normal Neck: Normal range of motion Cardiovascular: Normal rate Pulmonary/chest: Effort normal Neurologic: Patient is alert Skin: Skin is warm Psychiatric: Patient has normal mood and affect    Ortho Exam: Orthopedic exam demonstrates good cervical spine range of motion left shoulder demonstrates weakness to external rotation and some limitation of forward flexion and abduction strength.  She has coarse grinding and crepitus with active and passive range of motion of the left shoulder.  O'Brien's testing positive on the left negative on the right.  No other masses lymph and after skin changes noted in the shoulder girdle region.  No limitation of Passive external rotation at 15 abduction and left versus right  Specialty Comments:  No specialty comments available.  Imaging: No results found.   PMFS History: Patient Active Problem List   Diagnosis Date Noted  . S/P total knee arthroplasty 07/03/2014   Past Medical History:  Diagnosis Date  . Cancer (HCC)    melanoma of toe (big toe on left foot) removed  . Depression   .  Diabetes mellitus without complication ([place])    dx 7-8 yrs  type 2  . GERD (gastroesophageal reflux disease)   . Hypertension     No family history on file.  Past Surgical History:  Procedure Laterality Date  . RTR     RIGHT SHOULDER IN 2013  . TOTAL KNEE ARTHROPLASTY Left 07/03/2014   Procedure: LEFT TOTAL KNEE ARTHROPLASTY;  Surgeon: Vickey Huger, MD;  Location: [facility];  Service: Orthopedics;  Laterality: Left;   Social History   Occupational History  . Not on file.   Social  History Main Topics  . Smoking status: Never Smoker  . Smokeless tobacco: Never Used  . Alcohol use 1.2 oz/week    2 Glasses of wine per week  . Drug use: No  . Sexual activity: Not on file

## 2016-06-30 ENCOUNTER — Telehealth (INDEPENDENT_AMBULATORY_CARE_PROVIDER_SITE_OTHER): Payer: Self-pay | Admitting: Radiology

## 2016-06-30 ENCOUNTER — Ambulatory Visit: Payer: Medicare Other | Admitting: Orthopedic Surgery

## 2016-06-30 ENCOUNTER — Other Ambulatory Visit (INDEPENDENT_AMBULATORY_CARE_PROVIDER_SITE_OTHER): Payer: Self-pay

## 2016-06-30 DIAGNOSIS — M75122 Complete rotator cuff tear or rupture of left shoulder, not specified as traumatic: Secondary | ICD-10-CM | POA: Diagnosis not present

## 2016-06-30 DIAGNOSIS — M7552 Bursitis of left shoulder: Secondary | ICD-10-CM | POA: Diagnosis not present

## 2016-06-30 DIAGNOSIS — M7522 Bicipital tendinitis, left shoulder: Secondary | ICD-10-CM | POA: Diagnosis not present

## 2016-06-30 DIAGNOSIS — M94212 Chondromalacia, left shoulder: Secondary | ICD-10-CM | POA: Diagnosis not present

## 2016-06-30 DIAGNOSIS — G8918 Other acute postprocedural pain: Secondary | ICD-10-CM | POA: Diagnosis not present

## 2016-06-30 DIAGNOSIS — M659 Synovitis and tenosynovitis, unspecified: Secondary | ICD-10-CM | POA: Diagnosis not present

## 2016-06-30 MED ORDER — BACLOFEN 10 MG PO TABS: 10.0000 mg | ORAL_TABLET | Freq: Three times a day (TID) | ORAL | 0 refills | Status: DC | PRN

## 2016-06-30 NOTE — Telephone Encounter (Signed)
Pls change to baclofen 10 mg po q 8 # 30 pls clal thx

## 2016-06-30 NOTE — Telephone Encounter (Signed)
Gloria Rose from [facility] called to notify the Robaxin for patient is on back order with no release date.  Is there a substitute Dr. Marlou Sa wants to prescribe?   CB # [phone] Fax # [phone]

## 2016-06-30 NOTE — Telephone Encounter (Signed)
Do you want to change rx?

## 2016-06-30 NOTE — Telephone Encounter (Signed)
rx in chart and also called into pharm to advise of change.

## 2016-07-01 ENCOUNTER — Telehealth (INDEPENDENT_AMBULATORY_CARE_PROVIDER_SITE_OTHER): Payer: Self-pay

## 2016-07-01 NOTE — Telephone Encounter (Signed)
Nothing stronger than oxycodone please call thanks

## 2016-07-01 NOTE — Telephone Encounter (Signed)
ic advised pt

## 2016-07-01 NOTE — Telephone Encounter (Signed)
Patient called stating that Oxycodone is not helping with the pain, would like to know if another Rx can be prescribed?  CB# is [phone].  Please Advise.  Thank You.

## 2016-07-01 NOTE — Telephone Encounter (Signed)
Please advise. Requesting stronger pain meds.

## 2016-07-02 ENCOUNTER — Telehealth (INDEPENDENT_AMBULATORY_CARE_PROVIDER_SITE_OTHER): Payer: Self-pay | Admitting: Orthopedic Surgery

## 2016-07-02 NOTE — Telephone Encounter (Signed)
Will you see what you can get worked out for her?

## 2016-07-02 NOTE — Telephone Encounter (Signed)
Patient called advised the CPM machine is $750.00 for 4 weeks. Patient said medicare will not pay for it because she had a repair instead of replacement. The number to contact patient is [phone]

## 2016-07-03 DIAGNOSIS — M7522 Bicipital tendinitis, left shoulder: Secondary | ICD-10-CM | POA: Diagnosis not present

## 2016-07-03 DIAGNOSIS — M75120 Complete rotator cuff tear or rupture of unspecified shoulder, not specified as traumatic: Secondary | ICD-10-CM | POA: Diagnosis not present

## 2016-07-03 NOTE — Telephone Encounter (Signed)
Called [employer] and patient

## 2016-07-03 NOTE — Telephone Encounter (Signed)
Dr Marlou Sa said ok for new immobilizer.

## 2016-07-03 NOTE — Telephone Encounter (Signed)
Please ask Dr. Marlou Sa if this pt can use the new knee immobilizer that [employer] showed him yesterday. Thank you! :)

## 2016-07-07 ENCOUNTER — Ambulatory Visit (INDEPENDENT_AMBULATORY_CARE_PROVIDER_SITE_OTHER): Payer: Medicare Other | Admitting: Orthopedic Surgery

## 2016-07-07 DIAGNOSIS — M75122 Complete rotator cuff tear or rupture of left shoulder, not specified as traumatic: Secondary | ICD-10-CM

## 2016-07-07 NOTE — Progress Notes (Signed)
   Post-Op Visit Note   Patient: Gloria Rose           Date of Birth: 06/08/1950           MRN: [id number] Visit Date: 07/07/2016 PCP: Gloria Squibb, MD   Assessment & Plan:  Chief Complaint:  Chief Complaint  Patient presents with  . Left Shoulder - Routine Post Op   Visit Diagnoses: No diagnosis found.  Plan: Gloria Rose is a 66 year old patient 1 week out left shoulder rotator cuff tear repair.  She is in a adjustable shoulder abduction splint which helps with range of motion.  On exam she has pretty good passive range of motion with no course grinding or crepitus.  Deltoid fires.  Plan at this time is to continue with her shoulder range of motion via the abduction splint.  I'll see her back in 2 weeks for clinical recheck.  I did write her prescription today for physical therapy to start in 2 weeks.  Follow-Up Instructions: Return in about 2 weeks (around 07/21/2016).   Orders:  No orders of the defined types were placed in this encounter.  No orders of the defined types were placed in this encounter.   Imaging: No results found.  PMFS History: Patient Active Problem List   Diagnosis Date Noted  . S/P total knee arthroplasty 07/03/2014   Past Medical History:  Diagnosis Date  . Cancer (HCC)    melanoma of toe (big toe on left foot) removed  . Depression   . Diabetes mellitus without complication ([place])    dx 7-8 yrs  type 2  . GERD (gastroesophageal reflux disease)   . Hypertension     No family history on file.  Past Surgical History:  Procedure Laterality Date  . RTR     RIGHT SHOULDER IN 2013  . TOTAL KNEE ARTHROPLASTY Left 07/03/2014   Procedure: LEFT TOTAL KNEE ARTHROPLASTY;  Surgeon: Gloria Huger, MD;  Location: [facility];  Service: Orthopedics;  Laterality: Left;   Social History   Occupational History  . Not on file.   Social History Main Topics  . Smoking status: Never Smoker  . Smokeless tobacco: Never Used  . Alcohol use 1.2 oz/week    2 Glasses of  wine per week  . Drug use: No  . Sexual activity: Not on file

## 2016-07-21 ENCOUNTER — Encounter (INDEPENDENT_AMBULATORY_CARE_PROVIDER_SITE_OTHER): Payer: Self-pay | Admitting: Orthopedic Surgery

## 2016-07-21 ENCOUNTER — Ambulatory Visit (INDEPENDENT_AMBULATORY_CARE_PROVIDER_SITE_OTHER): Payer: Medicare Other | Admitting: Orthopedic Surgery

## 2016-07-21 DIAGNOSIS — M75122 Complete rotator cuff tear or rupture of left shoulder, not specified as traumatic: Secondary | ICD-10-CM

## 2016-07-21 MED ORDER — OXYCODONE HCL 5 MG PO TABS: 5.0000 mg | ORAL_TABLET | Freq: Four times a day (QID) | ORAL | 0 refills | Status: DC | PRN

## 2016-07-21 NOTE — Progress Notes (Signed)
   Post-Op Visit Note   Patient: Gloria Rose           Date of Birth: Aug 25, 1950           MRN: [id number] Visit Date: 07/21/2016 PCP: Celene Squibb, MD   Assessment & Plan:  Chief Complaint:  Chief Complaint  Patient presents with  . Left Shoulder - Routine Post Op, Follow-up   Visit Diagnoses: No diagnosis found.  Plan: Iisha is a 66 year old female with left shoulder massive rotator cuff tear repair.  In general she's doing well.  On exam she has no coarseness with passive range of motion of the shoulder.  Plan is to start physical therapy in 2 days.  Refill oxycodone.  Come back in 3 weeks at which time we will initiate more strengthening.  Follow-Up Instructions: Return in about 3 weeks (around 08/11/2016).   Orders:  No orders of the defined types were placed in this encounter.  No orders of the defined types were placed in this encounter.   Imaging: No results found.  PMFS History: Patient Active Problem List   Diagnosis Date Noted  . S/P total knee arthroplasty 07/03/2014   Past Medical History:  Diagnosis Date  . Cancer (HCC)    melanoma of toe (big toe on left foot) removed  . Depression   . Diabetes mellitus without complication ([place])    dx 7-8 yrs  type 2  . GERD (gastroesophageal reflux disease)   . Hypertension     No family history on file.  Past Surgical History:  Procedure Laterality Date  . RTR     RIGHT SHOULDER IN 2013  . TOTAL KNEE ARTHROPLASTY Left 07/03/2014   Procedure: LEFT TOTAL KNEE ARTHROPLASTY;  Surgeon: Vickey Huger, MD;  Location: [facility];  Service: Orthopedics;  Laterality: Left;   Social History   Occupational History  . Not on file.   Social History Main Topics  . Smoking status: Never Smoker  . Smokeless tobacco: Never Used  . Alcohol use 1.2 oz/week    2 Glasses of wine per week  . Drug use: No  . Sexual activity: Not on file

## 2016-07-22 ENCOUNTER — Telehealth (HOSPITAL_COMMUNITY): Payer: Self-pay | Admitting: Occupational Therapy

## 2016-07-22 NOTE — Telephone Encounter (Signed)
Pt called she's having trouble with transporation will call back to cx if need be.  Nf 07/22/16

## 2016-07-23 ENCOUNTER — Ambulatory Visit (HOSPITAL_COMMUNITY): Payer: Medicare Other | Admitting: Occupational Therapy

## 2016-07-23 ENCOUNTER — Ambulatory Visit (HOSPITAL_COMMUNITY): Payer: Medicare Other | Attending: Orthopedic Surgery | Admitting: Occupational Therapy

## 2016-07-23 ENCOUNTER — Encounter (HOSPITAL_COMMUNITY): Payer: Self-pay | Admitting: Occupational Therapy

## 2016-07-23 DIAGNOSIS — R29898 Other symptoms and signs involving the musculoskeletal system: Secondary | ICD-10-CM | POA: Insufficient documentation

## 2016-07-23 DIAGNOSIS — M79622 Pain in left upper arm: Secondary | ICD-10-CM

## 2016-07-23 DIAGNOSIS — M25612 Stiffness of left shoulder, not elsewhere classified: Secondary | ICD-10-CM | POA: Insufficient documentation

## 2016-07-23 DIAGNOSIS — M25512 Pain in left shoulder: Secondary | ICD-10-CM | POA: Insufficient documentation

## 2016-07-23 NOTE — Therapy (Addendum)
[facility] [place], [place], [address] Phone: [phone]   Fax:  [phone]  Occupational Therapy Evaluation  Patient Details  Name: Gloria Rose MRN: [id number] Date of Birth: March 09, 1950 Referring Provider: Meredith Pel, MD  Encounter Date: 07/23/2016      OT End of Session - 07/23/16 1544    Visit Number 1   Number of Visits 8   Date for OT Re-Evaluation 09/21/16  mini-reassessment 08/20/16   Authorization Type [employer]   Authorization Time Period Before 10th visit   Authorization - Visit Number 1   Authorization - Number of Visits 10   OT Start Time 1441   OT Stop Time 1511   OT Time Calculation (min) 30 min   Activity Tolerance Patient tolerated treatment well   Behavior During Therapy [facility] for tasks assessed/performed      Past Medical History:  Diagnosis Date  . Cancer (HCC)    melanoma of toe (big toe on left foot) removed  . Depression   . Diabetes mellitus without complication ([place])    dx 7-8 yrs  type 2  . GERD (gastroesophageal reflux disease)   . Hypertension     Past Surgical History:  Procedure Laterality Date  . RTR     RIGHT SHOULDER IN 2013  . TOTAL KNEE ARTHROPLASTY Left 07/03/2014   Procedure: LEFT TOTAL KNEE ARTHROPLASTY;  Surgeon: Vickey Huger, MD;  Location: [facility];  Service: Orthopedics;  Laterality: Left;    There were no vitals filed for this visit.      Subjective Assessment - 07/23/16 1449    Subjective  S: I know all about this therapy because I''ve been through it before.    Pertinent History Pt is a 66 y/o female s/p left open rotator cuff repair on 06/30/16. Pt was referred to therapy for evaluation and treatment following protocol by Dr. Meredith Pel.    Patient Stated Goals To decrease pain, use left arm, lift things, reach overhead   Currently in Pain? No/denies           [facility] OT Assessment - 07/23/16 1450      Assessment   Diagnosis left open  rotator cuff repair   Referring Provider Meredith Pel, MD   Onset Date 06/30/16  surgery date   Assessment Follow up appt August 6th   Prior Therapy OP OT 4/27-5/23     Precautions   Precautions Shoulder   Precaution Comments 7/16-8/6: P/ROM only; 8/7-until: AA/ROM and progress as tolerated     Restrictions   Weight Bearing Restrictions Yes   Other Position/Activity Restrictions non weight bearing     Balance Screen   Has the patient fallen in the past 6 months No   Has the patient had a decrease in activity level because of a fear of falling?  No     Home  Environment   Family/patient expects to be discharged to: Private residence   Lives With Spouse     Prior Function   Level of [place] Retired   Leisure reading, outdoor work-gardening, flowers     ADL   ADL comments Pt is having difficulty with dressing, grooming, washing and fixing hair, reaching overhead and anything using LUE.     Mobility   Mobility Status Independent     Written Expression   Dominant Hand Right     Vision - History   Baseline Vision Wears contact  Cognition   Overall Cognitive Status Within Functional Limits for tasks assessed     Observation/Other Assessments   Observations 6.5cm incision along left anterior boarder     ROM / Strength   AROM / PROM / Strength AROM;PROM;Strength     Palpation   Palpation comment Moderate fascial restrictions along upper arm, trapezius, and scapularis regions     AROM   Overall AROM  Unable to assess;Due to precautions     PROM   Overall PROM  Deficits   Overall PROM Comments Assessed supine, er/IR adducted   PROM Assessment Site Shoulder   Right/Left Shoulder Left   Left Shoulder Flexion 127 Degrees   Left Shoulder ABduction 81 Degrees   Left Shoulder Internal Rotation 90 Degrees   Left Shoulder External Rotation 30 Degrees     Strength   Overall Strength Unable to assess;Due to precautions                   OT Treatments/Exercises (OP) - 07/23/16 1458      Exercises   Exercises Shoulder     Shoulder Exercises: Supine   Protraction PROM;10 reps   Horizontal ABduction PROM;10 reps   External Rotation PROM;10 reps   Internal Rotation PROM;10 reps   Flexion PROM;10 reps   ABduction PROM;10 reps     Manual Therapy   Manual Therapy Myofascial release   Manual therapy comments Completed separately from therapeutic exercises   Myofascial Release Myofasical release to left upper arm, trapezius, and scapularis regions to decrease pain and fascial restrictions and improve joint range of motion.                OT Education - 07/23/16 1511    Education provided Yes   Education Details Provided pt with table slides   Person(s) Educated Patient   Methods Explanation;Demonstration;Verbal cues;Handout   Comprehension Verbalized understanding;Returned demonstration          OT Short Term Goals - 07/23/16 1610      OT SHORT TERM GOAL #1   Title Pt will be educated on HEP to improve functional use of LUE during daily tasks.    Time 4   Period Weeks   Status New     OT SHORT TERM GOAL #2   Title Pt will decrease fascial restrictions in LUE from mod to min amounts to improve mobility required for overhead reaching.    Time 4   Period Weeks   Status New     OT SHORT TERM GOAL #3   Title Pt will improve LUE P/ROM to WNL to increase ability to wash and fix hair.    Time 4   Period Weeks   Status New     OT SHORT TERM GOAL #4   Title Pt will increase LUE strength to 3+/5 to improve ability to complete gardening tasks.    Time 4   Period Weeks   Status New     OT SHORT TERM GOAL #5   Time 4           OT Long Term Goals - 07/23/16 1613      OT LONG TERM GOAL #1   Title Pt will return to prior level of independence using LUE for all ADL and lesiure tasks.   Time 8   Period Weeks   Status New     OT LONG TERM GOAL #2   Title Pt will decrease  fascial restrictions in LUE from min to trace amounts  to improve mobility required for overhead reaching.    Time 8   Period Weeks   Status New     OT LONG TERM GOAL #3   Title Pt will improve LUE A/ROM to [facility] to increase ability to wash and fix hair.    Time 8   Period Weeks   Status New     OT LONG TERM GOAL #4   Title Pt will increase LUE strength to 4+/5 to improve ability to complete gardening tasks.    Time 8   Period Weeks   Status New     OT LONG TERM GOAL #5   Title Pt will verbalize pain level at 2/10 or less consistently when completing all ADL and leisure tasks with LUE.    Time 8   Period Weeks   Status New               Plan - 07/23/16 1548    Clinical Impression Statement A: Patient is a 66 year-old female s/p left open rotator cuff repair on 06/30/16 causing increased pain, fascial restrictions and decreased strength and ROM resulting in difficulty completing all daily and leisure tasks. Pt will come to therapy 1x/week due to high copay.   Occupational Profile and client history currently impacting functional performance Motivated to return to PLOF, has been through therapy before and is aware of process   Occupational performance deficits (Please refer to evaluation for details): ADL's;IADL's;Rest and Sleep;Leisure   Rehab Potential Good   Current Impairments/barriers affecting progress: History of left arm problems   OT Frequency 1x / week   OT Duration 8 weeks   OT Treatment/Interventions Self-care/ADL training;Therapeutic exercise;Patient/family education;Ultrasound;Manual Therapy;Iontophoresis;Cryotherapy;DME and/or AE instruction;Therapeutic activities;Electrical Stimulation;Scar mobilization;Moist Heat;Passive range of motion   Plan P: Patient will benefit from skilled OT services to increase functional performance during daily and leisure related tasks while using LUE. Treatment plan: Follow general protocol. Gradually progress to AA/ROM as able.  Myofascial release, manual stretching, P/ROM, AA/ROM, A/ROM and general strengthening.    Clinical Decision Making Limited treatment options, no task modification necessary   OT Home Exercise Plan 07/23/16: table slides   Consulted and Agree with Plan of Care Patient      Patient will benefit from skilled therapeutic intervention in order to improve the following deficits and impairments:  Decreased scar mobility, Decreased strength, Impaired flexibility, Decreased mobility, Decreased range of motion, Pain, Decreased coordination, Increased fascial restricitons, Impaired UE functional use  Visit Diagnosis: Stiffness of left shoulder, not elsewhere classified - Plan: Ot plan of care cert/re-cert  Other symptoms and signs involving the musculoskeletal system - Plan: Ot plan of care cert/re-cert  Pain in left upper arm - Plan: Ot plan of care cert/re-cert      G-Codes - 40/10/27 1625    Functional Assessment Tool Used (Outpatient only) Clinical judgement   Functional Limitation Carrying, moving and handling objects   Carrying, Moving and Handling Objects Current Status (O5366) At least 80 percent but less than 100 percent impaired, limited or restricted   Carrying, Moving and Handling Objects Goal Status (Y4034) At least 20 percent but less than 40 percent impaired, limited or restricted      Problem List Patient Active Problem List   Diagnosis Date Noted  . S/P total knee arthroplasty 07/03/2014    Luther Hearing, OT Student [phone] 07/23/2016, 4:25 PM  [facility] [place], [place], [address] Phone: [phone]   Fax:  [phone]  Name: KAIESHA TONNER MRN: [id number] Date of Birth: 07-11-1950    This qualified practitioner was present in the room guiding the student in service delivery. Therapy student was participating in the provision of services, and the practitioner was not engaged in treating another  patient or doing other tasks at the same time.  Viona Hosking, OTR/L  [phone] 07/23/2016

## 2016-07-23 NOTE — Patient Instructions (Signed)
SHOULDER: Flexion On Table   Place hands on table, elbows straight. Move hips away from body. Press hands down into table. Hold ___ seconds. ___ reps per set, ___ sets per day, ___ days per week  Abduction (Passive)   With arm out to side, resting on table, lower head toward arm, keeping trunk away from table. Hold ____ seconds. Repeat ____ times. Do ____ sessions per day.  Copyright  VHI. All rights reserved.     Internal Rotation (Assistive)   Seated with elbow bent at right angle and held against side, slide arm on table surface in an inward arc. Repeat ____ times. Do ____ sessions per day. Activity: Use this motion to brush crumbs off the table.  Copyright  VHI. All rights reserved.   

## 2016-07-24 DIAGNOSIS — E119 Type 2 diabetes mellitus without complications: Secondary | ICD-10-CM | POA: Diagnosis not present

## 2016-07-30 ENCOUNTER — Encounter (HOSPITAL_COMMUNITY): Payer: Self-pay | Admitting: Occupational Therapy

## 2016-07-30 ENCOUNTER — Ambulatory Visit (HOSPITAL_COMMUNITY): Payer: Medicare Other | Admitting: Occupational Therapy

## 2016-07-30 DIAGNOSIS — M25512 Pain in left shoulder: Secondary | ICD-10-CM | POA: Diagnosis not present

## 2016-07-30 DIAGNOSIS — M25612 Stiffness of left shoulder, not elsewhere classified: Secondary | ICD-10-CM

## 2016-07-30 DIAGNOSIS — R29898 Other symptoms and signs involving the musculoskeletal system: Secondary | ICD-10-CM

## 2016-07-30 DIAGNOSIS — M79622 Pain in left upper arm: Secondary | ICD-10-CM | POA: Diagnosis not present

## 2016-07-30 NOTE — Patient Instructions (Signed)
1) Seated Row   Sit up straight with elbows by your sides. Pull back with shoulders/elbows, keeping forearms straight, as if pulling back on the reins of a horse. Squeeze shoulder blades together. Repeat _10-15__times, _1-2___sets/day    2) Shoulder Elevation    Sit up straight with arms by your sides. Slowly bring your shoulders up towards your ears. Repeat_10-15__times, __1-2__ sets/day    3) Shoulder Extension    Sit up straight with both arms by your side, draw your arms back behind your waist. Keep your elbows straight. Repeat __10-15__times, _1-2___sets/day.        

## 2016-07-30 NOTE — Therapy (Addendum)
[place], [place], [address] Phone: [phone]   Fax:  [phone]  Occupational Therapy Treatment  Patient Details  Name: Gloria Rose MRN: [id number] Date of Birth: [birth year] Referring Provider: Meredith Pel, MD  Encounter Date: 07/30/2016      OT End of Session - 07/30/16 1203    Visit Number 2   Number of Visits 8   Date for OT Re-Evaluation 09/21/16  mini-reassessment 08/20/16   Authorization Type [employer] Medicare   Authorization Time Period Before 10th visit   Authorization - Visit Number 2   Authorization - Number of Visits 10   OT Start Time 1119   OT Stop Time 1158   OT Time Calculation (min) 39 min   Activity Tolerance Patient tolerated treatment well   Behavior During Therapy [facility] for tasks assessed/performed      Past Medical History:  Diagnosis Date  . Cancer (HCC)    melanoma of toe (big toe on left foot) removed  . Depression   . Diabetes mellitus without complication ([place])    dx 7-8 yrs  type 2  . GERD (gastroesophageal reflux disease)   . Hypertension     Past Surgical History:  Procedure Laterality Date  . RTR     RIGHT SHOULDER IN 2013  . TOTAL KNEE ARTHROPLASTY Left 07/03/2014   Procedure: LEFT TOTAL KNEE ARTHROPLASTY;  Surgeon: Vickey Huger, MD;  Location: [place];  Service: Orthopedics;  Laterality: Left;    There were no vitals filed for this visit.      Subjective Assessment - 07/30/16 1119    Subjective  S: It's about the same.    Currently in Pain? No/denies            [facility] OT Assessment - 07/30/16 1118      Assessment   Diagnosis left open rotator cuff repair     Precautions   Precautions Shoulder   Precaution Comments 7/16-8/6: P/ROM only; 8/7-until: AA/ROM and progress     Restrictions   Weight Bearing Restrictions Yes   Other Position/Activity Restrictions non weight bearing                  OT Treatments/Exercises (OP) - 07/30/16  1121      Exercises   Exercises Shoulder;Elbow     Shoulder Exercises: Supine   Protraction PROM;10 reps   Horizontal ABduction PROM;10 reps   External Rotation PROM;10 reps   Internal Rotation PROM;10 reps   Flexion PROM;10 reps   ABduction PROM;10 reps     Shoulder Exercises: Therapy Ball   Flexion 10 reps   ABduction 10 reps     Shoulder Exercises: ROM/Strengthening   Thumb Tacks 1' low level   Anterior Glide 3X5"   Caudal Glide 3X5"     Shoulder Exercises: Isometric Strengthening   Flexion Supine;3X5"   Extension Supine;3X5"   External Rotation Supine;3X5"   Internal Rotation Supine;3X5"   ABduction Supine;3X5"   ADduction Supine;3X5"     Elbow Exercises   Elbow Extension AROM;10 reps   Forearm Supination AROM;10 reps   Forearm Pronation AROM;10 reps   Other elbow exercises elbow flexion, A/ROM, 10X     Manual Therapy   Manual Therapy Myofascial release   Manual therapy comments Completed separately from therapeutic exercises   Myofascial Release Myofasical release to left upper arm, trapezius, and scapularis regions to decrease pain and fascial restrictions and improve joint range of motion.  OT Education - 07/30/16 1143    Education provided Yes   Education Details scapular A/ROM   Person(s) Educated Patient   Methods Explanation;Demonstration;Handout   Comprehension Verbalized understanding;Returned demonstration          OT Short Term Goals - 07/30/16 1206      OT SHORT TERM GOAL #1   Title Pt will be educated on HEP to improve functional use of LUE during daily tasks.    Time 4   Period Weeks   Status On-going     OT SHORT TERM GOAL #2   Title Pt will decrease fascial restrictions in LUE from mod to min amounts to improve mobility required for overhead reaching.    Time 4   Period Weeks   Status On-going     OT SHORT TERM GOAL #3   Title Pt will improve LUE P/ROM to WNL to increase ability to wash and fix hair.     Time 4   Period Weeks   Status On-going     OT SHORT TERM GOAL #4   Title Pt will increase LUE strength to 3+/5 to improve ability to complete gardening tasks.    Time 4   Period Weeks   Status On-going     OT SHORT TERM GOAL #5   Time 4   Status On-going           OT Long Term Goals - 07/30/16 1206      OT LONG TERM GOAL #1   Title Pt will return to prior level of independence using LUE for all ADL and lesiure tasks.   Time 8   Period Weeks   Status On-going     OT LONG TERM GOAL #2   Title Pt will decrease fascial restrictions in LUE from min to trace amounts to improve mobility required for overhead reaching.    Time 8   Period Weeks   Status On-going     OT LONG TERM GOAL #3   Title Pt will improve LUE A/ROM to [facility] to increase ability to wash and fix hair.    Time 8   Period Weeks   Status On-going     OT LONG TERM GOAL #4   Title Pt will increase LUE strength to 4+/5 to improve ability to complete gardening tasks.    Time 8   Period Weeks   Status On-going     OT LONG TERM GOAL #5   Title Pt will verbalize pain level at 2/10 or less consistently when completing all ADL and leisure tasks with LUE.    Time 8   Period Weeks   Status On-going               Plan - 07/30/16 1203    Clinical Impression Statement A: Initiated myofascial release, manual therapy, P/ROM, scapular A/ROM, therapy ball stretches, and isometric strengthening this session. Pt presents with increased tightness along scapular border. Pt has difficulty with full elbow extension, added elbow exercises to HEP as well. Occasional verbal cuing for form and technique, as well as speed required.    Plan P: continue with protocol, progress to AA/ROM when protocol allows. Follow up on HEP   OT Home Exercise Plan 07/23/16: table slides; 7/25: scapular A/ROM   Consulted and Agree with Plan of Care Patient      Patient will benefit from skilled therapeutic intervention in order to improve  the following deficits and impairments:  Decreased scar mobility, Decreased strength, Impaired flexibility,  Decreased mobility, Decreased range of motion, Pain, Decreased coordination, Increased fascial restricitons, Impaired UE functional use  Visit Diagnosis: Stiffness of left shoulder, not elsewhere classified  Other symptoms and signs involving the musculoskeletal system  Acute pain of left shoulder    Problem List Patient Active Problem List   Diagnosis Date Noted  . S/P total knee arthroplasty 07/03/2014   Gloria Rose, OTR/L  [phone] 07/30/2016, 12:14 PM  [place] [address] [place], [place], [address] Phone: [phone]   Fax:  [phone]  Name: Gloria Rose MRN: [id number] Date of Birth: [birth year]

## 2016-08-11 ENCOUNTER — Encounter (INDEPENDENT_AMBULATORY_CARE_PROVIDER_SITE_OTHER): Payer: Self-pay | Admitting: Orthopedic Surgery

## 2016-08-11 ENCOUNTER — Ambulatory Visit (INDEPENDENT_AMBULATORY_CARE_PROVIDER_SITE_OTHER): Payer: Medicare Other | Admitting: Orthopedic Surgery

## 2016-08-11 DIAGNOSIS — M75122 Complete rotator cuff tear or rupture of left shoulder, not specified as traumatic: Secondary | ICD-10-CM

## 2016-08-12 NOTE — Progress Notes (Signed)
   Post-Op Visit Note   Patient: Gloria Rose           Date of Birth: 1950/04/11           MRN: [id number] Visit Date: 08/11/2016 PCP: Celene Squibb, MD   Assessment & Plan:  Chief Complaint:  Chief Complaint  Patient presents with  . Left Shoulder - Routine Post Op   Visit Diagnoses: No diagnosis found.  Plan: Charnee is a patient who is now about 6 weeks out left shoulder rotator cuff repair.  On exam the shoulder repair still feels intact.  Strength however is lacking.  She not taking any medications.  Plan is for physical therapy to start strengthening with 6 week return.  Follow-Up Instructions: Return in about 6 weeks (around 09/22/2016).   Orders:  No orders of the defined types were placed in this encounter.  No orders of the defined types were placed in this encounter.   Imaging: No results found.  PMFS History: Patient Active Problem List   Diagnosis Date Noted  . S/P total knee arthroplasty 07/03/2014   Past Medical History:  Diagnosis Date  . Cancer (HCC)    melanoma of toe (big toe on left foot) removed  . Depression   . Diabetes mellitus without complication ([place])    dx 7-8 yrs  type 2  . GERD (gastroesophageal reflux disease)   . Hypertension     No family history on file.  Past Surgical History:  Procedure Laterality Date  . RTR     RIGHT SHOULDER IN 2013  . TOTAL KNEE ARTHROPLASTY Left 07/03/2014   Procedure: LEFT TOTAL KNEE ARTHROPLASTY;  Surgeon: Vickey Huger, MD;  Location: [place];  Service: Orthopedics;  Laterality: Left;   Social History   Occupational History  . Not on file.   Social History Main Topics  . Smoking status: Never Smoker  . Smokeless tobacco: Never Used  . Alcohol use 1.2 oz/week    2 Glasses of wine per week  . Drug use: No  . Sexual activity: Not on file

## 2016-08-13 ENCOUNTER — Ambulatory Visit (HOSPITAL_COMMUNITY): Payer: Medicare Other | Attending: Orthopedic Surgery | Admitting: Occupational Therapy

## 2016-08-13 ENCOUNTER — Encounter (HOSPITAL_COMMUNITY): Payer: Self-pay | Admitting: Occupational Therapy

## 2016-08-13 DIAGNOSIS — M79622 Pain in left upper arm: Secondary | ICD-10-CM

## 2016-08-13 DIAGNOSIS — M25612 Stiffness of left shoulder, not elsewhere classified: Secondary | ICD-10-CM | POA: Diagnosis not present

## 2016-08-13 DIAGNOSIS — M25512 Pain in left shoulder: Secondary | ICD-10-CM | POA: Diagnosis not present

## 2016-08-13 DIAGNOSIS — R29898 Other symptoms and signs involving the musculoskeletal system: Secondary | ICD-10-CM | POA: Diagnosis not present

## 2016-08-13 NOTE — Patient Instructions (Signed)

## 2016-08-14 NOTE — Therapy (Signed)
[place], [place], [address] Phone: [phone]   Fax:  [phone]  Occupational Therapy Treatment  Patient Details  Name: Gloria Rose MRN: [id number] Date of Birth: 27-Jun-1950 Referring Provider: Meredith Pel, MD  Encounter Date: 08/13/2016      OT End of Session - 08/13/16 0927    Visit Number 3   Number of Visits 8   Date for OT Re-Evaluation 09/21/16  mini-reassessment 08/20/16   Authorization Type U[employer] Medicare   Authorization Time Period Before 10th visit   Authorization - Visit Number 3   Authorization - Number of Visits 10   OT Start Time 1352   OT Stop Time 1432   OT Time Calculation (min) 40 min   Activity Tolerance Patient tolerated treatment well   Behavior During Therapy [facility] for tasks assessed/performed      Past Medical History:  Diagnosis Date  . Cancer (HCC)    melanoma of toe (big toe on left foot) removed  . Depression   . Diabetes mellitus without complication ([place])    dx 7-8 yrs  type 2  . GERD (gastroesophageal reflux disease)   . Hypertension     Past Surgical History:  Procedure Laterality Date  . RTR     RIGHT SHOULDER IN 2013  . TOTAL KNEE ARTHROPLASTY Left 07/03/2014   Procedure: LEFT TOTAL KNEE ARTHROPLASTY;  Surgeon: Vickey Huger, MD;  Location: [place];  Service: Orthopedics;  Laterality: Left;    There were no vitals filed for this visit.      Subjective Assessment - 08/13/16 1411    Subjective  S: I'm tired of doing nothing.    Currently in Pain? No/denies            [facility] OT Assessment - 08/13/16 1411      Assessment   Diagnosis left open rotator cuff repair     Precautions   Precautions Shoulder   Precaution Comments 7/16-8/6: P/ROM only; 8/7-until: AA/ROM and progress     Restrictions   Weight Bearing Restrictions No                  OT Treatments/Exercises (OP) - 08/13/16 1356      Exercises   Exercises Shoulder;Elbow      Shoulder Exercises: Supine   Protraction PROM;5 reps;AAROM;10 reps   Horizontal ABduction PROM;5 reps;AAROM;10 reps   External Rotation PROM;5 reps;AAROM;10 reps   Internal Rotation PROM;5 reps;AAROM;10 reps   Flexion PROM;5 reps;AAROM;10 reps   ABduction PROM;5 reps;AAROM;10 reps     Shoulder Exercises: Seated   Protraction AAROM;10 reps   External Rotation AAROM;10 reps   Internal Rotation AAROM;10 reps   Flexion AAROM;5 reps     Shoulder Exercises: Therapy Ball   Flexion 15 reps   ABduction 15 reps     Shoulder Exercises: ROM/Strengthening   Wall Wash 1' low level   Thumb Tacks 1' low level     Manual Therapy   Manual Therapy Myofascial release   Manual therapy comments Completed separately from therapeutic exercises   Myofascial Release Myofasical release to left upper arm, trapezius, and scapularis regions to decrease pain and fascial restrictions and improve joint range of motion.                 OT Education - 08/13/16 1412    Education provided Yes   Education Details AA/ROM exercises   Person(s) Educated Patient   Methods Explanation;Demonstration;Handout   Comprehension Verbalized  understanding;Returned demonstration          OT Short Term Goals - 07/30/16 1206      OT SHORT TERM GOAL #1   Title Pt will be educated on HEP to improve functional use of LUE during daily tasks.    Time 4   Period Weeks   Status On-going     OT SHORT TERM GOAL #2   Title Pt will decrease fascial restrictions in LUE from mod to min amounts to improve mobility required for overhead reaching.    Time 4   Period Weeks   Status On-going     OT SHORT TERM GOAL #3   Title Pt will improve LUE P/ROM to WNL to increase ability to wash and fix hair.    Time 4   Period Weeks   Status On-going     OT SHORT TERM GOAL #4   Title Pt will increase LUE strength to 3+/5 to improve ability to complete gardening tasks.    Time 4   Period Weeks   Status On-going     OT  SHORT TERM GOAL #5   Time 4   Status On-going           OT Long Term Goals - 07/30/16 1206      OT LONG TERM GOAL #1   Title Pt will return to prior level of independence using LUE for all ADL and lesiure tasks.   Time 8   Period Weeks   Status On-going     OT LONG TERM GOAL #2   Title Pt will decrease fascial restrictions in LUE from min to trace amounts to improve mobility required for overhead reaching.    Time 8   Period Weeks   Status On-going     OT LONG TERM GOAL #3   Title Pt will improve LUE A/ROM to [facility] to increase ability to wash and fix hair.    Time 8   Period Weeks   Status On-going     OT LONG TERM GOAL #4   Title Pt will increase LUE strength to 4+/5 to improve ability to complete gardening tasks.    Time 8   Period Weeks   Status On-going     OT LONG TERM GOAL #5   Title Pt will verbalize pain level at 2/10 or less consistently when completing all ADL and leisure tasks with LUE.    Time 8   Period Weeks   Status On-going               Plan - 08/13/16 0932    Clinical Impression Statement A: Progressed to AA/ROM this session, pt performed all exercises supine, mod difficulty with abduction. Pt completed protraction, flexion, and er/IR seated, max difficulty with depressing shoulder due to trapezius compensation for weakness. Verbal cuing for form and technique.  Pt provided with updated HEP for AA/ROM, instructed to complete in supine.    Plan P: Follow up on HEP, continue with AA/ROM adding horizontal abduction and abduction in sitting when appropriate.    OT Home Exercise Plan 07/23/16: table slides; 7/25: scapular A/ROM; 8/8: AA/ROM   Consulted and Agree with Plan of Care Patient      Patient will benefit from skilled therapeutic intervention in order to improve the following deficits and impairments:  Decreased scar mobility, Decreased strength, Impaired flexibility, Decreased mobility, Decreased range of motion, Pain, Decreased  coordination, Increased fascial restricitons, Impaired UE functional use  Visit Diagnosis: Stiffness of left shoulder,  not elsewhere classified  Other symptoms and signs involving the musculoskeletal system  Acute pain of left shoulder  Pain in left upper arm    Problem List Patient Active Problem List   Diagnosis Date Noted  . S/P total knee arthroplasty 07/03/2014   Gloria Rose, OTR/L  [phone] 08/14/2016, 9:30 AM  [facility] [address] [place], [place], [address] Phone: [phone]   Fax:  [phone]  Name: Gloria Rose MRN: [id number] Date of Birth: 26-Mar-1950

## 2016-08-20 ENCOUNTER — Telehealth (HOSPITAL_COMMUNITY): Payer: Self-pay | Admitting: Internal Medicine

## 2016-08-20 ENCOUNTER — Ambulatory Visit (HOSPITAL_COMMUNITY): Payer: Medicare Other | Admitting: Occupational Therapy

## 2016-08-20 NOTE — Telephone Encounter (Signed)
08/20/16  patient left a message that she was cancelling because she had been up all night sick

## 2016-08-27 ENCOUNTER — Ambulatory Visit (HOSPITAL_COMMUNITY): Payer: Medicare Other | Admitting: Occupational Therapy

## 2016-08-29 ENCOUNTER — Telehealth (HOSPITAL_COMMUNITY): Payer: Self-pay | Admitting: Occupational Therapy

## 2016-08-29 NOTE — Telephone Encounter (Signed)
Called l/m --ask pt to come in on Thurs at 11:15, LT has a class to attend. NF 08/29/16

## 2016-08-29 NOTE — Telephone Encounter (Signed)
Pt agreed to move this apptment>NF Ask pt to come in on Thurs at 1:00, LT has a class to attend. NF 08/29/16  40.00 copay

## 2016-09-03 ENCOUNTER — Ambulatory Visit (HOSPITAL_COMMUNITY): Payer: Medicare Other | Admitting: Occupational Therapy

## 2016-09-04 ENCOUNTER — Encounter (HOSPITAL_COMMUNITY): Payer: Self-pay | Admitting: Occupational Therapy

## 2016-09-04 ENCOUNTER — Ambulatory Visit (HOSPITAL_COMMUNITY): Payer: Medicare Other | Admitting: Occupational Therapy

## 2016-09-04 DIAGNOSIS — M25612 Stiffness of left shoulder, not elsewhere classified: Secondary | ICD-10-CM

## 2016-09-04 DIAGNOSIS — M25512 Pain in left shoulder: Secondary | ICD-10-CM | POA: Diagnosis not present

## 2016-09-04 DIAGNOSIS — M79622 Pain in left upper arm: Secondary | ICD-10-CM | POA: Diagnosis not present

## 2016-09-04 DIAGNOSIS — R29898 Other symptoms and signs involving the musculoskeletal system: Secondary | ICD-10-CM

## 2016-09-04 NOTE — Therapy (Signed)
[place], [place], [address] Phone: [phone]   Fax:  [phone]  Occupational Therapy Treatment  Patient Details  Name: Gloria Rose MRN: [id number] Date of Birth: 1950/03/20 Referring Provider: Meredith Pel, MD  Encounter Date: 09/04/2016      OT End of Session - 09/04/16 1202    Visit Number 4   Number of Visits 8   Date for OT Re-Evaluation 09/21/16    Authorization Type [employer] Medicare   Authorization Time Period Before 10th visit   Authorization - Visit Number 4   Authorization - Number of Visits 10   OT Start Time 1115   OT Stop Time 1156   OT Time Calculation (min) 41 min   Activity Tolerance Patient tolerated treatment well   Behavior During Therapy [facility] for tasks assessed/performed      Past Medical History:  Diagnosis Date  . Cancer (HCC)    melanoma of toe (big toe on left foot) removed  . Depression   . Diabetes mellitus without complication ([place])    dx 7-8 yrs  type 2  . GERD (gastroesophageal reflux disease)   . Hypertension     Past Surgical History:  Procedure Laterality Date  . RTR     RIGHT SHOULDER IN 2013  . TOTAL KNEE ARTHROPLASTY Left 07/03/2014   Procedure: LEFT TOTAL KNEE ARTHROPLASTY;  Surgeon: Vickey Huger, MD;  Location: [place];  Service: Orthopedics;  Laterality: Left;    There were no vitals filed for this visit.      Subjective Assessment - 09/04/16 1201    Subjective  S: I fell out of bed last Tuesday.    Currently in Pain? Yes   Pain Score 2    Pain Location Shoulder   Pain Orientation Left   Pain Descriptors / Indicators Sore   Pain Type Acute pain   Pain Radiating Towards n/a   Pain Onset In the past 7 days   Pain Frequency Intermittent   Aggravating Factors  movement   Pain Relieving Factors rest   Effect of Pain on Daily Activities min effect on ADL completion   Multiple Pain Sites No            [facility] OT Assessment - 09/04/16 1115       Assessment   Diagnosis left open rotator cuff repair     Precautions   Precautions Shoulder   Precaution Comments 7/16-8/6: P/ROM only; 8/7-until: AA/ROM and progress     Restrictions   Weight Bearing Restrictions No                  OT Treatments/Exercises (OP) - 09/04/16 1133      Exercises   Exercises Shoulder;Elbow     Shoulder Exercises: Supine   Protraction PROM;5 reps;AAROM;10 reps   Horizontal ABduction PROM;5 reps;AAROM;10 reps   External Rotation PROM;5 reps;AAROM;10 reps   Internal Rotation PROM;5 reps;AAROM;10 reps   Flexion PROM;5 reps;AAROM;10 reps   ABduction PROM;5 reps;AAROM;10 reps     Shoulder Exercises: Seated   Protraction AAROM;10 reps   Horizontal ABduction AAROM;10 reps   External Rotation AAROM;10 reps   Internal Rotation AAROM;10 reps   Flexion AAROM;10 reps   Flexion Limitations hands at middle of dowel rod   Abduction AAROM;10 reps   ABduction Limitations 50% range     Shoulder Exercises: Pulleys   Flexion 1 minute   ABduction 1 minute     Manual Therapy   Manual  Therapy Myofascial release   Manual therapy comments Completed separately from therapeutic exercises   Myofascial Release Myofasical release to left upper arm, trapezius, and scapularis regions to decrease pain and fascial restrictions and improve joint range of motion.                   OT Short Term Goals - 07/30/16 1206      OT SHORT TERM GOAL #1   Title Pt will be educated on HEP to improve functional use of LUE during daily tasks.    Time 4   Period Weeks   Status On-going     OT SHORT TERM GOAL #2   Title Pt will decrease fascial restrictions in LUE from mod to min amounts to improve mobility required for overhead reaching.    Time 4   Period Weeks   Status On-going     OT SHORT TERM GOAL #3   Title Pt will improve LUE P/ROM to WNL to increase ability to wash and fix hair.    Time 4   Period Weeks   Status On-going     OT SHORT TERM  GOAL #4   Title Pt will increase LUE strength to 3+/5 to improve ability to complete gardening tasks.    Time 4   Period Weeks   Status On-going     OT SHORT TERM GOAL #5   Time 4   Status On-going           OT Long Term Goals - 07/30/16 1206      OT LONG TERM GOAL #1   Title Pt will return to prior level of independence using LUE for all ADL and lesiure tasks.   Time 8   Period Weeks   Status On-going     OT LONG TERM GOAL #2   Title Pt will decrease fascial restrictions in LUE from min to trace amounts to improve mobility required for overhead reaching.    Time 8   Period Weeks   Status On-going     OT LONG TERM GOAL #3   Title Pt will improve LUE A/ROM to [facility] to increase ability to wash and fix hair.    Time 8   Period Weeks   Status On-going     OT LONG TERM GOAL #4   Title Pt will increase LUE strength to 4+/5 to improve ability to complete gardening tasks.    Time 8   Period Weeks   Status On-going     OT LONG TERM GOAL #5   Title Pt will verbalize pain level at 2/10 or less consistently when completing all ADL and leisure tasks with LUE.    Time 8   Period Weeks   Status On-going               Plan - 09/04/16 1202    Clinical Impression Statement A: Pt reports she fell out of bed last week and her shoulder has been sore but is improving. Pt demonstrates improvement in exercise completion this session, able to achieve ROM [facility]. Continues to have difficulty with depressing shoulder during seated exercises. Verbal cuing for form and technique.    Plan P: Continue with AA/ROM progressing to A/ROM as able to tolerate. Add wall wash, prot/ret/elev/dep   OT Home Exercise Plan 07/23/16: table slides; 7/25: scapular A/ROM; 8/8: AA/ROM   Consulted and Agree with Plan of Care Patient      Patient will benefit from skilled therapeutic intervention in order  to improve the following deficits and impairments:  Decreased scar mobility, Decreased strength, Impaired  flexibility, Decreased mobility, Decreased range of motion, Pain, Decreased coordination, Increased fascial restricitons, Impaired UE functional use  Visit Diagnosis: Stiffness of left shoulder, not elsewhere classified  Other symptoms and signs involving the musculoskeletal system  Acute pain of left shoulder    Problem List Patient Active Problem List   Diagnosis Date Noted  . S/P total knee arthroplasty 07/03/2014   Susan Bleich, OTR/L  [phone] 09/04/2016, 12:05 PM  [place] [address] [place], [place], [address] Phone: [phone]   Fax:  [phone]  Name: Gloria Rose MRN: [id number] Date of Birth: 08-Oct-1950

## 2016-09-10 ENCOUNTER — Ambulatory Visit (HOSPITAL_COMMUNITY): Payer: Medicare Other | Admitting: Occupational Therapy

## 2016-09-10 ENCOUNTER — Telehealth (HOSPITAL_COMMUNITY): Payer: Self-pay | Admitting: Internal Medicine

## 2016-09-10 NOTE — Telephone Encounter (Signed)
09/10/16  patient left a message to cx but no reason was given

## 2016-09-17 ENCOUNTER — Telehealth (HOSPITAL_COMMUNITY): Payer: Self-pay | Admitting: Internal Medicine

## 2016-09-17 ENCOUNTER — Ambulatory Visit (HOSPITAL_COMMUNITY): Payer: Medicare Other | Admitting: Occupational Therapy

## 2016-09-17 NOTE — Telephone Encounter (Signed)
09/17/16  Pt cx via the phone tree

## 2016-09-22 ENCOUNTER — Ambulatory Visit (INDEPENDENT_AMBULATORY_CARE_PROVIDER_SITE_OTHER): Payer: Self-pay | Admitting: Orthopedic Surgery

## 2016-09-24 ENCOUNTER — Telehealth (HOSPITAL_COMMUNITY): Payer: Self-pay | Admitting: Internal Medicine

## 2016-09-24 ENCOUNTER — Ambulatory Visit (HOSPITAL_COMMUNITY): Payer: Medicare Other | Admitting: Occupational Therapy

## 2016-09-24 NOTE — Telephone Encounter (Signed)
09/24/16  Called and left a message asking the patient if she wanted to discharge or reschedule her missed appointments.

## 2016-09-24 NOTE — Telephone Encounter (Signed)
09/24/16  Pt cx via the phone tree

## 2016-09-25 ENCOUNTER — Ambulatory Visit (INDEPENDENT_AMBULATORY_CARE_PROVIDER_SITE_OTHER): Payer: Self-pay | Admitting: Orthopedic Surgery

## 2016-10-08 ENCOUNTER — Encounter (HOSPITAL_COMMUNITY): Payer: Self-pay | Admitting: Occupational Therapy

## 2016-10-08 ENCOUNTER — Ambulatory Visit (HOSPITAL_COMMUNITY): Payer: Medicare Other | Attending: Orthopedic Surgery | Admitting: Occupational Therapy

## 2016-10-08 DIAGNOSIS — M25512 Pain in left shoulder: Secondary | ICD-10-CM | POA: Insufficient documentation

## 2016-10-08 DIAGNOSIS — M25612 Stiffness of left shoulder, not elsewhere classified: Secondary | ICD-10-CM | POA: Insufficient documentation

## 2016-10-08 DIAGNOSIS — R29898 Other symptoms and signs involving the musculoskeletal system: Secondary | ICD-10-CM | POA: Diagnosis not present

## 2016-10-08 NOTE — Therapy (Addendum)
[place], [place], [address] Phone: [phone]   Fax:  [phone]  Occupational Therapy Reassessment and Treatment (recertification)  Patient Details  Name: Gloria Rose MRN: [id number] Date of Birth: 1950-04-25 Referring Provider: Meredith Pel, MD  Encounter Date: 10/08/2016      OT End of Session - 10/08/16 1108    Visit Number 5   Number of Visits 8   Date for OT Re-Evaluation 11/07/16   Authorization Type [employer]   Authorization Time Period Before 15th visit   Authorization - Visit Number 5   Authorization - Number of Visits 15   OT Start Time 1035   OT Stop Time 1100   OT Time Calculation (min) 25 min   Activity Tolerance Patient tolerated treatment well   Behavior During Therapy [facility] for tasks assessed/performed      Past Medical History:  Diagnosis Date  . Cancer (HCC)    melanoma of toe (big toe on left foot) removed  . Depression   . Diabetes mellitus without complication ([place])    dx 7-8 yrs  type 2  . GERD (gastroesophageal reflux disease)   . Hypertension     Past Surgical History:  Procedure Laterality Date  . RTR     RIGHT SHOULDER IN 2013  . TOTAL KNEE ARTHROPLASTY Left 07/03/2014   Procedure: LEFT TOTAL KNEE ARTHROPLASTY;  Surgeon: Vickey Huger, MD;  Location: [facility];  Service: Orthopedics;  Laterality: Left;    There were no vitals filed for this visit.      Subjective Assessment - 10/08/16 1105    Subjective  S: I don't think it's ever going to be right.    Currently in Pain? No/denies            [facility] OT Assessment - 10/08/16 1034      Assessment   Diagnosis left open rotator cuff repair     Precautions   Precautions Shoulder   Precaution Comments 7/16-8/6: P/ROM only; 8/7-until: AA/ROM and progress     Palpation   Palpation comment Moderate fascial restrictions along upper arm, trapezius, and scapularis regions     AROM   Overall AROM Comments  Assessed seated, er/IR adducted   AROM Assessment Site Shoulder   Right/Left Shoulder Left   Left Shoulder Flexion 89 Degrees  not previously assessed   Left Shoulder ABduction 65 Degrees  not previously assessed   Left Shoulder Internal Rotation 90 Degrees  not previously assessed   Left Shoulder External Rotation 10 Degrees  not previously assessed     PROM   Overall PROM Comments Assessed supine, er/IR adducted   PROM Assessment Site Shoulder   Right/Left Shoulder Left   Left Shoulder Flexion 145 Degrees  127 previous   Left Shoulder ABduction 180 Degrees  81 previous   Left Shoulder Internal Rotation 90 Degrees  same as previous   Left Shoulder External Rotation 75 Degrees  30 previous     Strength   Overall Strength Comments Assessed seated, er/IR adducted   Strength Assessment Site Shoulder   Right/Left Shoulder Left   Left Shoulder Flexion 3-/5  not previously assessed   Left Shoulder ABduction 3-/5  not previously assessed   Left Shoulder Internal Rotation 3/5  not previously assessed   Left Shoulder External Rotation 2+/5  not previously assessed                  OT Treatments/Exercises (OP) - 10/08/16 1105  Exercises   Exercises Shoulder;Elbow     Shoulder Exercises: Supine   Protraction PROM;5 reps   Horizontal ABduction PROM;5 reps   External Rotation PROM;5 reps   Internal Rotation PROM;5 reps   Flexion PROM;5 reps   ABduction PROM;5 reps     Shoulder Exercises: Standing   Extension Theraband;10 reps   Theraband Level (Shoulder Extension) Level 2 (Red)   Row Theraband;10 reps   Theraband Level (Shoulder Row) Level 2 (Red)   Retraction Theraband;10 reps   Theraband Level (Shoulder Retraction) Level 2 (Red)                OT Education - 10/08/16 1104    Education provided Yes   Education Details sidelying A/ROM, scapular theraband   Person(s) Educated Patient   Methods Explanation;Demonstration;Handout    Comprehension Verbalized understanding;Returned demonstration          OT Short Term Goals - 10/08/16 1112      OT SHORT TERM GOAL #1   Title Pt will be educated on HEP to improve functional use of LUE during daily tasks.    Time 4   Period Weeks   Status On-going     OT SHORT TERM GOAL #2   Title Pt will decrease fascial restrictions in LUE from mod to min amounts to improve mobility required for overhead reaching.    Time 4   Period Weeks   Status On-going     OT SHORT TERM GOAL #3   Title Pt will improve LUE P/ROM to WNL to increase ability to wash and fix hair.    Time 4   Period Weeks   Status Partially Met     OT SHORT TERM GOAL #4   Title Pt will increase LUE strength to 3+/5 to improve ability to complete gardening tasks.    Time 4   Period Weeks   Status On-going     OT SHORT TERM GOAL #5   Time 4   Status On-going           OT Long Term Goals - 07/30/16 1206      OT LONG TERM GOAL #1   Title Pt will return to prior level of independence using LUE for all ADL and lesiure tasks.   Time 8   Period Weeks   Status On-going     OT LONG TERM GOAL #2   Title Pt will decrease fascial restrictions in LUE from min to trace amounts to improve mobility required for overhead reaching.    Time 8   Period Weeks   Status On-going     OT LONG TERM GOAL #3   Title Pt will improve LUE A/ROM to [facility] to increase ability to wash and fix hair.    Time 8   Period Weeks   Status On-going     OT LONG TERM GOAL #4   Title Pt will increase LUE strength to 4+/5 to improve ability to complete gardening tasks.    Time 8   Period Weeks   Status On-going     OT LONG TERM GOAL #5   Title Pt will verbalize pain level at 2/10 or less consistently when completing all ADL and leisure tasks with LUE.    Time 8   Period Weeks   Status On-going               Plan - 10/08/16 1108    Clinical Impression Statement A: Pt presents for reassessment today, has not been  seen  for treatment since 09/04/2016 and only had 3 treatment sessions prior to today's visit. Pt presents with limited A/ROM and strength, P/ROM is [facility] however strength deficits limiting A/ROM and functional use of LUE. Pt would benefit from therapy services if she was able to come to treatment sessions, discussed options with pt and she elected for HEP and to discuss shoulder with MD at appt tomorrow. Pt will call and let us know if she wants to continue therapy after talking to MD.     Occupational performance deficits (Please refer to evaluation for details): ADL's;IADL's;Rest and Sleep;Leisure   Rehab Potential Good   Current Impairments/barriers affecting progress: History of left arm problems   OT Frequency 1x / week   OT Duration 4 weeks   OT Treatment/Interventions Self-care/ADL training;Therapeutic exercise;Patient/family education;Ultrasound;Manual Therapy;Iontophoresis;Cryotherapy;DME and/or AE instruction;Therapeutic activities;Electrical Stimulation;Scar mobilization;Moist Heat;Passive range of motion   Plan P: Hold therapy until after MD appt and determine if pt wants to continue or not.    OT Home Exercise Plan 07/23/16: table slides; 7/25: scapular A/ROM; 8/8: AA/ROM; Oct 26, 2022: scapular theraband, sidelying A/ROM   Consulted and Agree with Plan of Care Patient      Patient will benefit from skilled therapeutic intervention in order to improve the following deficits and impairments:  Decreased scar mobility, Decreased strength, Impaired flexibility, Decreased mobility, Decreased range of motion, Pain, Decreased coordination, Increased fascial restricitons, Impaired UE functional use  Visit Diagnosis: Stiffness of left shoulder, not elsewhere classified  Other symptoms and signs involving the musculoskeletal system  Acute pain of left shoulder      G-Codes - 25-Oct-2016 1206    Functional Assessment Tool Used (Outpatient only) Clinical judgement   Functional Limitation Carrying, moving and  handling objects   Carrying, Moving and Handling Objects Current Status (W0981) At least 40 percent but less than 60 percent impaired, limited or restricted   Carrying, Moving and Handling Objects Goal Status (X9147) At least 20 percent but less than 40 percent impaired, limited or restricted      Problem List Patient Active Problem List   Diagnosis Date Noted  . S/P total knee arthroplasty 07/03/2014   Guadelupe Sabin, OTR/L  [phone] 10-25-16, 12:08 PM  [place] [address] [place], [place], [address] Phone: [phone]   Fax:  [phone]  Name: Gloria Rose MRN: [id number] Date of Birth: Aug 13, 1950    OCCUPATIONAL THERAPY DISCHARGE SUMMARY  Visits from Start of Care: 5  Current functional level related to goals / functional outcomes: See above for reassessment. Pt has not returned to clinic since reassessment and therefore will be discharged.    Remaining deficits: unknown   Education / Equipment: HEP Plan: Patient agrees to discharge.  Patient goals were not met. Patient is being discharged due to not returning since the last visit.  ?????

## 2016-10-08 NOTE — Patient Instructions (Signed)
Side Lying Exercises: Complete 10-15X each, 1-2x/day   1) Sidelying Flexion:   Lie on your side with your affected arm up. Start with the weight in your top hand by your side. Lift the arm forward and pull your shoulder blade down as the arm lifts up (like a seesaw). NO WEIGHT     2) Sidelying abduction:   Lie on your side with your arm down straight at your side.  Raise the arm up and overhead, keeping the elbow straight.  You may turn the palm forward and inward if you can.     3) Sidelying horizontal abduction:   Lie on your side with arm straight up towards ceiling. Lower the arm straight out to face the wall and bring back up towards ceiling.     4) Sidelying internal/external rotation:   Lie on side with elbow bent. Lower and raise forearm in direction of the ceiling, keeping elbow bent and by the side.           1) Extension: Isometric / Bilateral Arm Retraction - Sitting   Facing anchor, hold hands and elbow at shoulder height, with elbow bent.  Pull arms back to squeeze shoulder blades together. Repeat 10-15 times. 1-3 times/day.   Copyright  VHI. All rights reserved.   2) Retraction: Row - Bilateral (Anchor)   Facing anchor, arms reaching forward, pull hands toward stomach, keeping elbows bent and at your sides and pinching shoulder blades together. Repeat 10-15 times. 1-3 times/day.   Copyright  VHI. All rights reserved.   3) Extension / Flexion (Assist)   Face anchor, pull arms back, keeping elbow straight, and squeze shoulder blades together. Repeat 10-15 times. 1-3 times/day.   Copyright  VHI. All rights reserved.

## 2016-10-09 ENCOUNTER — Ambulatory Visit (INDEPENDENT_AMBULATORY_CARE_PROVIDER_SITE_OTHER): Payer: Medicare Other | Admitting: Orthopedic Surgery

## 2016-10-09 ENCOUNTER — Encounter (INDEPENDENT_AMBULATORY_CARE_PROVIDER_SITE_OTHER): Payer: Self-pay | Admitting: Orthopedic Surgery

## 2016-10-09 DIAGNOSIS — M75122 Complete rotator cuff tear or rupture of left shoulder, not specified as traumatic: Secondary | ICD-10-CM

## 2016-10-12 DIAGNOSIS — E119 Type 2 diabetes mellitus without complications: Secondary | ICD-10-CM | POA: Diagnosis not present

## 2016-10-13 NOTE — Progress Notes (Signed)
   Post-Op Visit Note   Patient: Gloria Rose           Date of Birth: [birth year]           MRN: [id number] Visit Date: 10/09/2016 PCP: Celene Squibb, MD   Assessment & Plan:  Chief Complaint:  Chief Complaint  Patient presents with  . Left Shoulder - Follow-up   Visit Diagnoses:  1. Complete tear of left rotator cuff     Plan: Gloria Rose is a 66 year old patient who is now 12 weeks out from massive rotator cuff tear repair.  She's going to physical therapy 1 time a week.  Doing about the same.  She's not having any pain.  On exam she still has some weakness to external rotation and supraspinatus testing.  No course grinding or crepitus noted with passive range of motion.  She is doing a home exercise program with bands.  Plan is that Gloria Rose has good result in terms of pain but still has some weakness which is not totally unexpected.  I would like to see her back in 6 months for clinical recheck and to recheck on function.  Follow-Up Instructions: Return in about 6 months (around 04/09/2017).   Orders:  No orders of the defined types were placed in this encounter.  No orders of the defined types were placed in this encounter.   Imaging: No results found.  PMFS History: Patient Active Problem List   Diagnosis Date Noted  . S/P total knee arthroplasty 07/03/2014   Past Medical History:  Diagnosis Date  . Cancer (HCC)    melanoma of toe (big toe on left foot) removed  . Depression   . Diabetes mellitus without complication ([place])    dx 7-8 yrs  type 2  . GERD (gastroesophageal reflux disease)   . Hypertension     No family history on file.  Past Surgical History:  Procedure Laterality Date  . RTR     RIGHT SHOULDER IN 2013  . TOTAL KNEE ARTHROPLASTY Left 07/03/2014   Procedure: LEFT TOTAL KNEE ARTHROPLASTY;  Surgeon: Vickey Huger, MD;  Location: [place];  Service: Orthopedics;  Laterality: Left;   Social History   Occupational History  . Not on file.   Social History  Main Topics  . Smoking status: Never Smoker  . Smokeless tobacco: Never Used  . Alcohol use 1.2 oz/week    2 Glasses of wine per week  . Drug use: No  . Sexual activity: Not on file

## 2016-10-15 DIAGNOSIS — E119 Type 2 diabetes mellitus without complications: Secondary | ICD-10-CM | POA: Diagnosis not present

## 2016-10-24 DIAGNOSIS — E782 Mixed hyperlipidemia: Secondary | ICD-10-CM | POA: Diagnosis not present

## 2016-10-24 DIAGNOSIS — Z1159 Encounter for screening for other viral diseases: Secondary | ICD-10-CM | POA: Diagnosis not present

## 2016-10-24 DIAGNOSIS — I10 Essential (primary) hypertension: Secondary | ICD-10-CM | POA: Diagnosis not present

## 2016-10-24 DIAGNOSIS — E1165 Type 2 diabetes mellitus with hyperglycemia: Secondary | ICD-10-CM | POA: Diagnosis not present

## 2016-10-28 DIAGNOSIS — I10 Essential (primary) hypertension: Secondary | ICD-10-CM | POA: Diagnosis not present

## 2016-11-26 DIAGNOSIS — Z85820 Personal history of malignant melanoma of skin: Secondary | ICD-10-CM | POA: Diagnosis not present

## 2016-11-26 DIAGNOSIS — B9689 Other specified bacterial agents as the cause of diseases classified elsewhere: Secondary | ICD-10-CM | POA: Diagnosis not present

## 2016-11-26 DIAGNOSIS — Z08 Encounter for follow-up examination after completed treatment for malignant neoplasm: Secondary | ICD-10-CM | POA: Diagnosis not present

## 2016-11-26 DIAGNOSIS — L0202 Furuncle of face: Secondary | ICD-10-CM | POA: Diagnosis not present

## 2016-11-26 DIAGNOSIS — Z1283 Encounter for screening for malignant neoplasm of skin: Secondary | ICD-10-CM | POA: Diagnosis not present

## 2016-12-31 DIAGNOSIS — E119 Type 2 diabetes mellitus without complications: Secondary | ICD-10-CM | POA: Diagnosis not present

## 2017-04-09 ENCOUNTER — Ambulatory Visit (INDEPENDENT_AMBULATORY_CARE_PROVIDER_SITE_OTHER): Payer: Medicare Other | Admitting: Orthopedic Surgery

## 2017-04-09 ENCOUNTER — Encounter (INDEPENDENT_AMBULATORY_CARE_PROVIDER_SITE_OTHER): Payer: Self-pay | Admitting: Orthopedic Surgery

## 2017-04-09 DIAGNOSIS — M75122 Complete rotator cuff tear or rupture of left shoulder, not specified as traumatic: Secondary | ICD-10-CM | POA: Diagnosis not present

## 2017-04-13 ENCOUNTER — Encounter (INDEPENDENT_AMBULATORY_CARE_PROVIDER_SITE_OTHER): Payer: Self-pay | Admitting: Orthopedic Surgery

## 2017-04-13 NOTE — Progress Notes (Signed)
Office Visit Note   Patient: Gloria Rose           Date of Birth: May 02, 1950           MRN: [id number] Visit Date: 04/09/2017 Requested by: Celene Squibb, MD [place], [place] [address] PCP: Celene Squibb, MD  Subjective: Chief Complaint  Patient presents with  . Left Shoulder - Follow-up    HPI: Gloria Rose is a patient with left shoulder pain.  Patient has massive rotator cuff tear.  This was treated 06/2016.  Patient states that they are getting more functional.  Still has to assist the arm with range of motion but overall is doing some better.              ROS: All systems reviewed are negative as they relate to the chief complaint within the history of present illness.  Patient denies  fevers or chills.   Assessment & Plan: Visit Diagnoses:  1. Complete tear of left rotator cuff, unspecified whether traumatic     Plan: Impression is rotator cuff arthropathy with 90 degrees of active abduction and a functional shoulder range of motion.  Next step would be shoulder replacement if needed for functional improvement in pain relief.  Neither of those are required at this time.  Follow-up with me as needed.  Follow-Up Instructions: Return if symptoms worsen or fail to improve.   Orders:  No orders of the defined types were placed in this encounter.  No orders of the defined types were placed in this encounter.     Procedures: No procedures performed   Clinical Data: No additional findings.  Objective: Vital Signs: There were no vitals taken for this visit.  Physical Exam:   Constitutional: Patient appears well-developed HEENT:  Head: Normocephalic Eyes:EOM are normal Neck: Normal range of motion Cardiovascular: Normal rate Pulmonary/chest: Effort normal Neurologic: Patient is alert Skin: Skin is warm Psychiatric: Patient has normal mood and affect    Ortho Exam: Orthopedic exam demonstrates full active and passive range of motion of the right  shoulder.  Left shoulder demonstrates 90 degrees of abduction and over 90 degrees of abduction with a cyst from the right arm.  Rotator cuff strength predictably weak to infraspinatus and supraspinatus testing.  Subscap intact.  No other masses lymph adenopathy or skin changes noted in that shoulder girdle region.  Some coarse grinding or crepitus present with passive range of motion of the left arm.  Specialty Comments:  No specialty comments available.  Imaging: No results found.   PMFS History: Patient Active Problem List   Diagnosis Date Noted  . S/P total knee arthroplasty 07/03/2014   Past Medical History:  Diagnosis Date  . Cancer (HCC)    melanoma of toe (big toe on left foot) removed  . Depression   . Diabetes mellitus without complication ([place])    dx 7-8 yrs  type 2  . GERD (gastroesophageal reflux disease)   . Hypertension     History reviewed. No pertinent family history.  Past Surgical History:  Procedure Laterality Date  . RTR     RIGHT SHOULDER IN 2013  . TOTAL KNEE ARTHROPLASTY Left 07/03/2014   Procedure: LEFT TOTAL KNEE ARTHROPLASTY;  Surgeon: Vickey Huger, MD;  Location: [facility] [place] [facility];  Service: Orthopedics;  Laterality: Left;   Social History   Occupational History  . Not on file  Tobacco Use  . Smoking status: Never Smoker  . Smokeless tobacco: Never Used  Substance  and Sexual Activity  . Alcohol use: Yes    Alcohol/week: 1.2 oz    Types: 2 Glasses of wine per week  . Drug use: No  . Sexual activity: Not on file

## 2017-04-20 ENCOUNTER — Other Ambulatory Visit (HOSPITAL_COMMUNITY): Payer: Self-pay | Admitting: Internal Medicine

## 2017-04-20 DIAGNOSIS — Z1231 Encounter for screening mammogram for malignant neoplasm of breast: Secondary | ICD-10-CM

## 2017-04-23 ENCOUNTER — Ambulatory Visit (HOSPITAL_COMMUNITY)
Admission: RE | Admit: 2017-04-23 | Discharge: 2017-04-23 | Disposition: A | Discharge status: Home / Self Care | Payer: Medicare Other | Source: Ambulatory Visit | Attending: Internal Medicine | Admitting: Internal Medicine

## 2017-04-23 ENCOUNTER — Encounter (HOSPITAL_COMMUNITY): Payer: Self-pay

## 2017-04-23 DIAGNOSIS — Z1231 Encounter for screening mammogram for malignant neoplasm of breast: Secondary | ICD-10-CM | POA: Insufficient documentation

## 2017-04-27 DIAGNOSIS — E1165 Type 2 diabetes mellitus with hyperglycemia: Secondary | ICD-10-CM | POA: Diagnosis not present

## 2017-04-27 DIAGNOSIS — E782 Mixed hyperlipidemia: Secondary | ICD-10-CM | POA: Diagnosis not present

## 2017-04-27 DIAGNOSIS — E785 Hyperlipidemia, unspecified: Secondary | ICD-10-CM | POA: Diagnosis not present

## 2017-04-27 DIAGNOSIS — I10 Essential (primary) hypertension: Secondary | ICD-10-CM | POA: Diagnosis not present

## 2017-04-29 DIAGNOSIS — E785 Hyperlipidemia, unspecified: Secondary | ICD-10-CM | POA: Diagnosis not present

## 2017-04-29 DIAGNOSIS — E782 Mixed hyperlipidemia: Secondary | ICD-10-CM | POA: Diagnosis not present

## 2017-04-29 DIAGNOSIS — I10 Essential (primary) hypertension: Secondary | ICD-10-CM | POA: Diagnosis not present

## 2017-04-29 DIAGNOSIS — Z0000 Encounter for general adult medical examination without abnormal findings: Secondary | ICD-10-CM | POA: Diagnosis not present

## 2017-04-29 DIAGNOSIS — E1165 Type 2 diabetes mellitus with hyperglycemia: Secondary | ICD-10-CM | POA: Diagnosis not present

## 2017-04-29 DIAGNOSIS — K219 Gastro-esophageal reflux disease without esophagitis: Secondary | ICD-10-CM | POA: Diagnosis not present

## 2018-05-04 DIAGNOSIS — I10 Essential (primary) hypertension: Secondary | ICD-10-CM | POA: Diagnosis not present

## 2018-05-04 DIAGNOSIS — E1165 Type 2 diabetes mellitus with hyperglycemia: Secondary | ICD-10-CM | POA: Diagnosis not present

## 2018-05-04 DIAGNOSIS — E785 Hyperlipidemia, unspecified: Secondary | ICD-10-CM | POA: Diagnosis not present

## 2018-05-10 DIAGNOSIS — K219 Gastro-esophageal reflux disease without esophagitis: Secondary | ICD-10-CM | POA: Diagnosis not present

## 2018-05-10 DIAGNOSIS — N182 Chronic kidney disease, stage 2 (mild): Secondary | ICD-10-CM | POA: Diagnosis not present

## 2018-05-10 DIAGNOSIS — I129 Hypertensive chronic kidney disease with stage 1 through stage 4 chronic kidney disease, or unspecified chronic kidney disease: Secondary | ICD-10-CM | POA: Diagnosis not present

## 2018-05-10 DIAGNOSIS — Z0000 Encounter for general adult medical examination without abnormal findings: Secondary | ICD-10-CM | POA: Diagnosis not present

## 2018-05-10 DIAGNOSIS — E782 Mixed hyperlipidemia: Secondary | ICD-10-CM | POA: Diagnosis not present

## 2018-05-19 DIAGNOSIS — H6123 Impacted cerumen, bilateral: Secondary | ICD-10-CM | POA: Diagnosis not present

## 2018-05-19 DIAGNOSIS — L209 Atopic dermatitis, unspecified: Secondary | ICD-10-CM | POA: Diagnosis not present

## 2018-05-19 DIAGNOSIS — J309 Allergic rhinitis, unspecified: Secondary | ICD-10-CM | POA: Diagnosis not present

## 2018-05-19 DIAGNOSIS — B3784 Candidal otitis externa: Secondary | ICD-10-CM | POA: Diagnosis not present

## 2018-06-08 DIAGNOSIS — E119 Type 2 diabetes mellitus without complications: Secondary | ICD-10-CM | POA: Diagnosis not present

## 2018-06-14 ENCOUNTER — Other Ambulatory Visit (HOSPITAL_COMMUNITY): Payer: Self-pay | Admitting: Internal Medicine

## 2018-06-14 DIAGNOSIS — Z1231 Encounter for screening mammogram for malignant neoplasm of breast: Secondary | ICD-10-CM

## 2018-06-30 ENCOUNTER — Ambulatory Visit (HOSPITAL_COMMUNITY): Payer: Medicare Other

## 2018-08-02 ENCOUNTER — Other Ambulatory Visit: Payer: Self-pay

## 2018-08-02 ENCOUNTER — Ambulatory Visit (HOSPITAL_COMMUNITY)
Admission: RE | Admit: 2018-08-02 | Discharge: 2018-08-02 | Disposition: A | Discharge status: Home / Self Care | Payer: Medicare Other | Source: Ambulatory Visit | Attending: Internal Medicine | Admitting: Internal Medicine

## 2018-08-02 DIAGNOSIS — Z1231 Encounter for screening mammogram for malignant neoplasm of breast: Secondary | ICD-10-CM | POA: Insufficient documentation

## 2018-08-09 ENCOUNTER — Other Ambulatory Visit: Payer: Medicare Other

## 2018-08-09 ENCOUNTER — Other Ambulatory Visit: Payer: Self-pay

## 2018-08-09 DIAGNOSIS — R6889 Other general symptoms and signs: Secondary | ICD-10-CM | POA: Diagnosis not present

## 2018-08-09 DIAGNOSIS — R509 Fever, unspecified: Secondary | ICD-10-CM | POA: Diagnosis not present

## 2018-08-09 DIAGNOSIS — Z20822 Contact with and (suspected) exposure to covid-19: Secondary | ICD-10-CM

## 2018-08-09 DIAGNOSIS — R51 Headache: Secondary | ICD-10-CM | POA: Diagnosis not present

## 2018-08-10 LAB — NOVEL CORONAVIRUS, NAA: SARS-CoV-2, NAA: NOT DETECTED

## 2018-08-27 DIAGNOSIS — E119 Type 2 diabetes mellitus without complications: Secondary | ICD-10-CM | POA: Diagnosis not present

## 2018-11-15 ENCOUNTER — Other Ambulatory Visit (HOSPITAL_COMMUNITY): Payer: Self-pay | Admitting: Internal Medicine

## 2018-11-15 DIAGNOSIS — E1122 Type 2 diabetes mellitus with diabetic chronic kidney disease: Secondary | ICD-10-CM | POA: Diagnosis not present

## 2018-11-15 DIAGNOSIS — Z13820 Encounter for screening for osteoporosis: Secondary | ICD-10-CM

## 2018-11-15 DIAGNOSIS — I129 Hypertensive chronic kidney disease with stage 1 through stage 4 chronic kidney disease, or unspecified chronic kidney disease: Secondary | ICD-10-CM | POA: Diagnosis not present

## 2018-11-15 DIAGNOSIS — I10 Essential (primary) hypertension: Secondary | ICD-10-CM | POA: Diagnosis not present

## 2018-11-15 DIAGNOSIS — E119 Type 2 diabetes mellitus without complications: Secondary | ICD-10-CM | POA: Diagnosis not present

## 2018-11-15 DIAGNOSIS — E782 Mixed hyperlipidemia: Secondary | ICD-10-CM | POA: Diagnosis not present

## 2018-11-15 DIAGNOSIS — K219 Gastro-esophageal reflux disease without esophagitis: Secondary | ICD-10-CM | POA: Diagnosis not present

## 2018-11-15 DIAGNOSIS — E785 Hyperlipidemia, unspecified: Secondary | ICD-10-CM | POA: Diagnosis not present

## 2018-11-28 IMAGING — DX DG SHOULDER 2+V*L*
3 series · 3 of 3 positions shown · non-contrast
Comparison: None.

CLINICAL DATA: Left shoulder pain for 2 months without known
injury.

EXAM:
LEFT SHOULDER - 2+ VIEW

[shoulder ap]
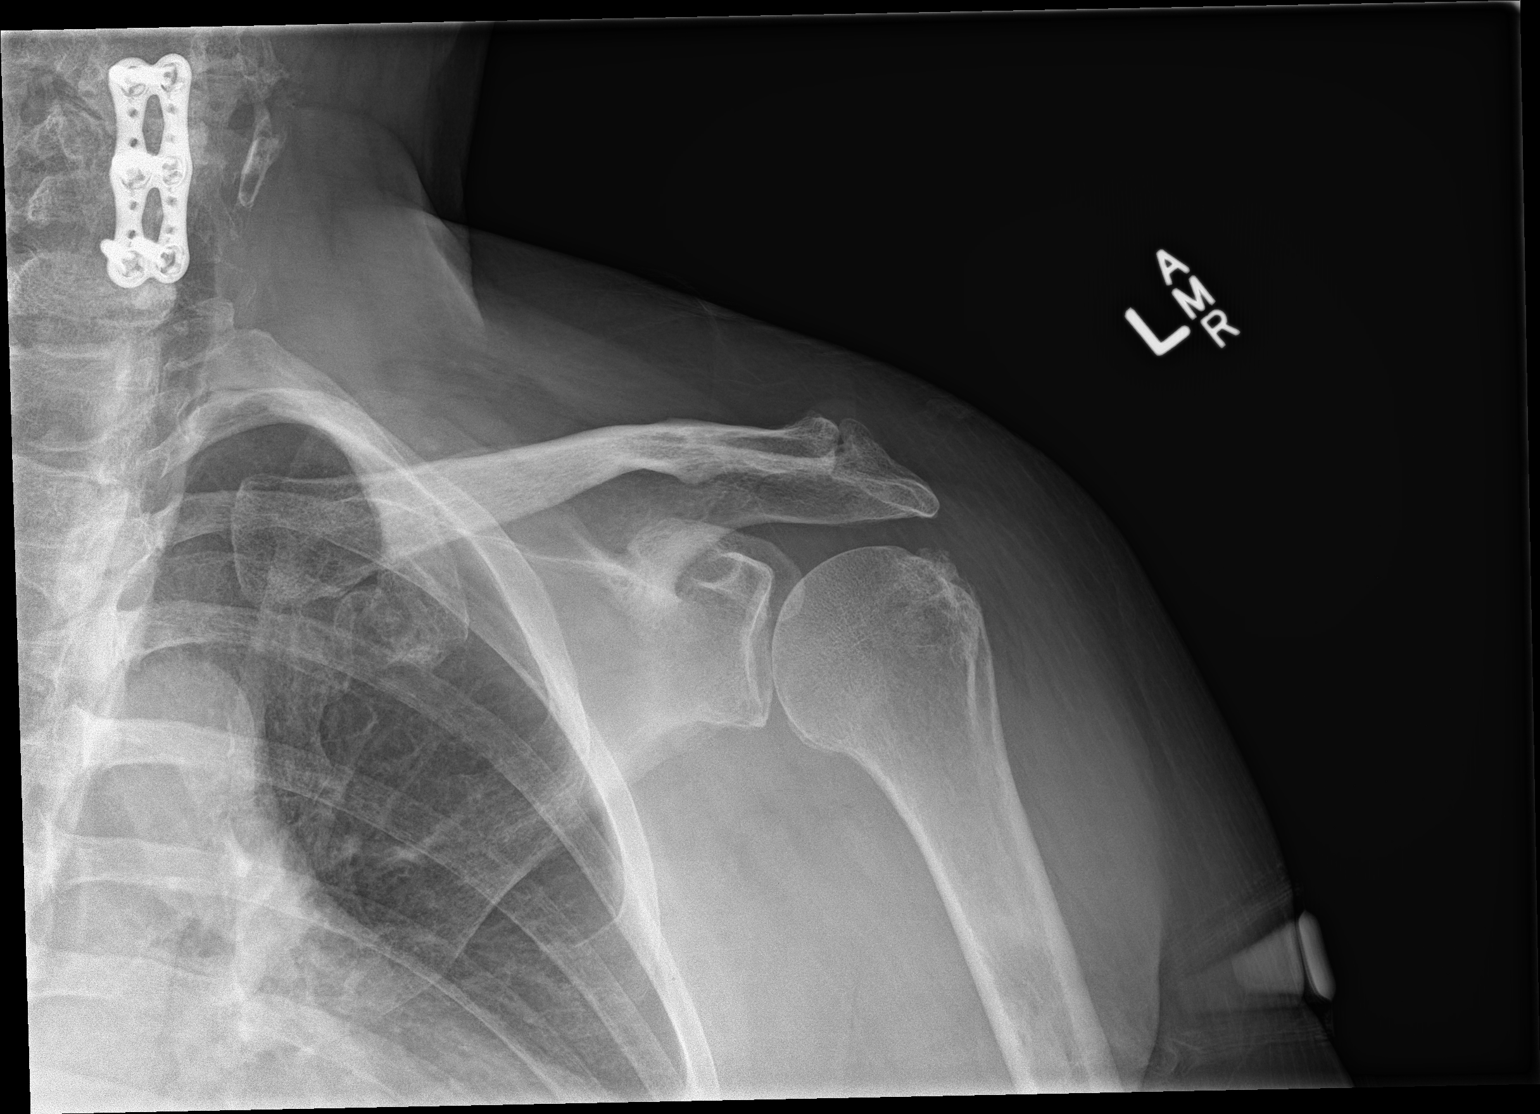

[shoulder y view]
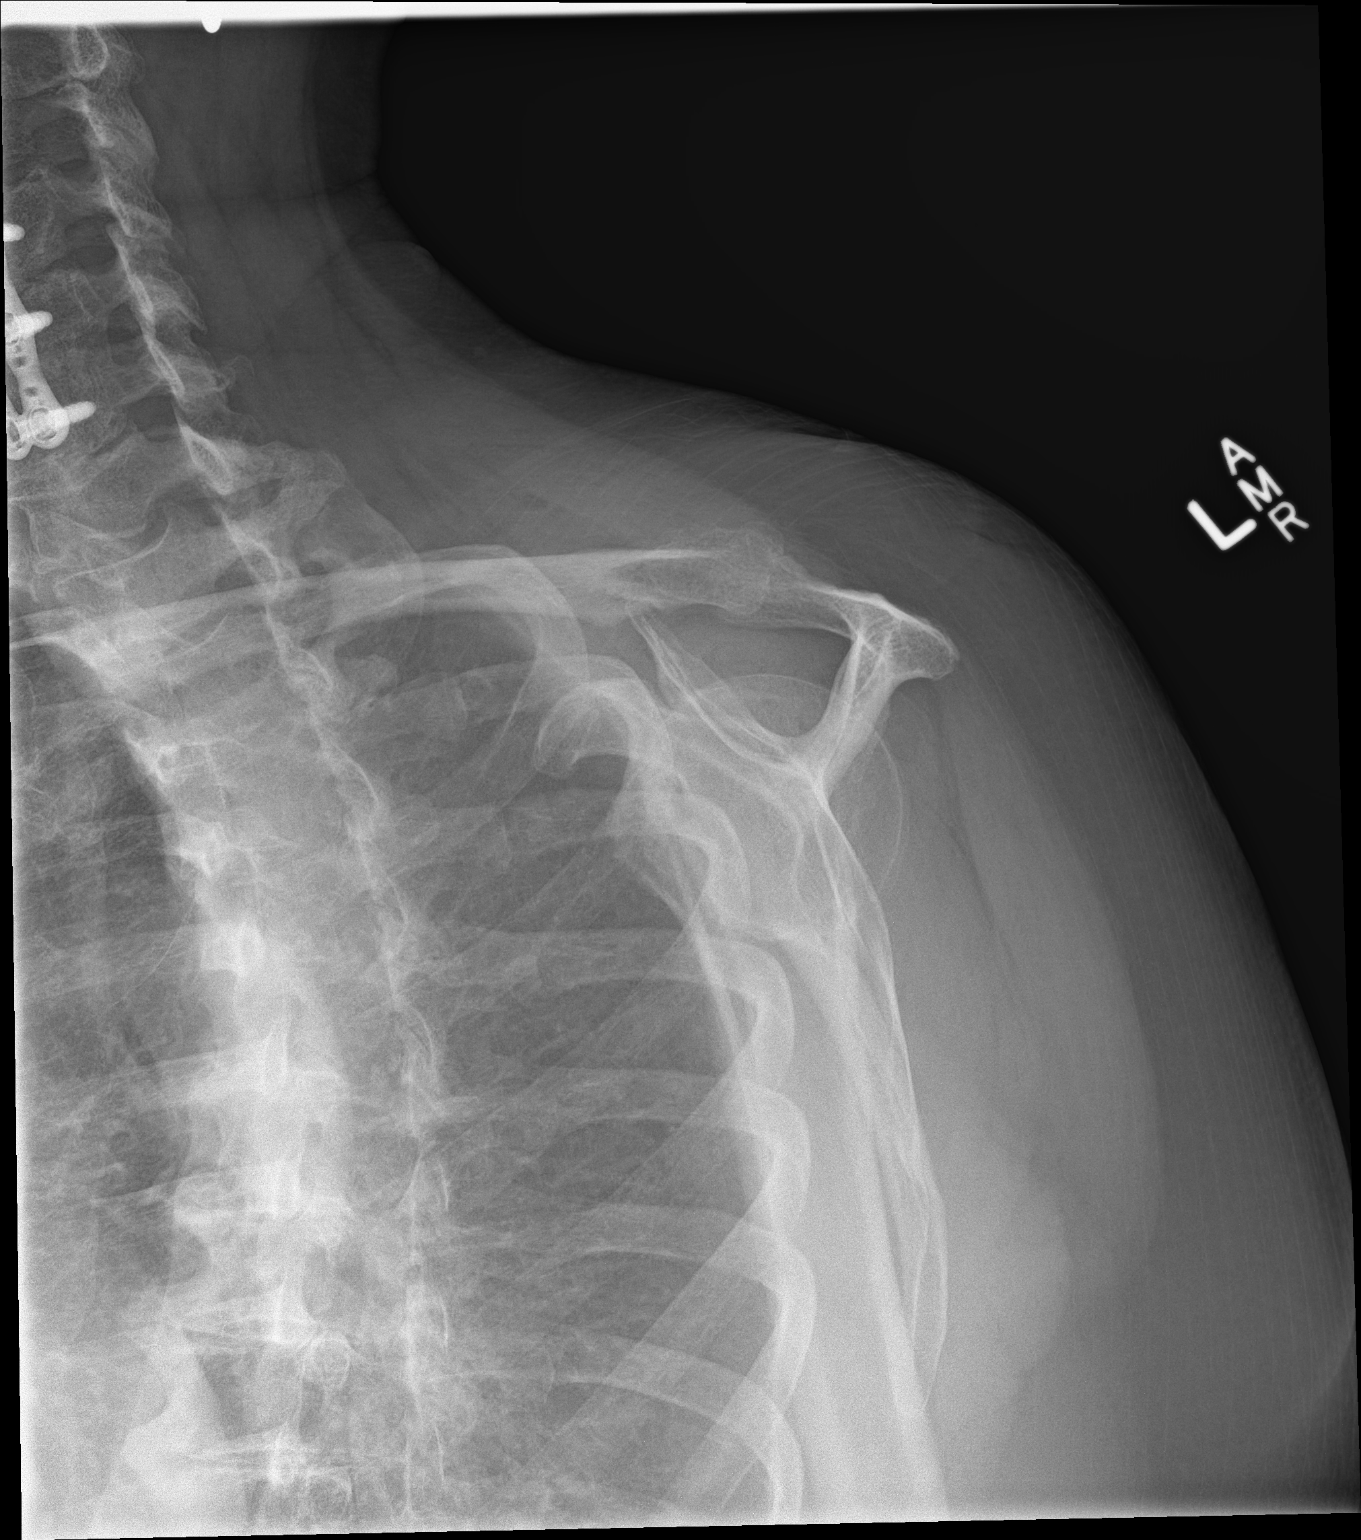

[shoulder axillary]
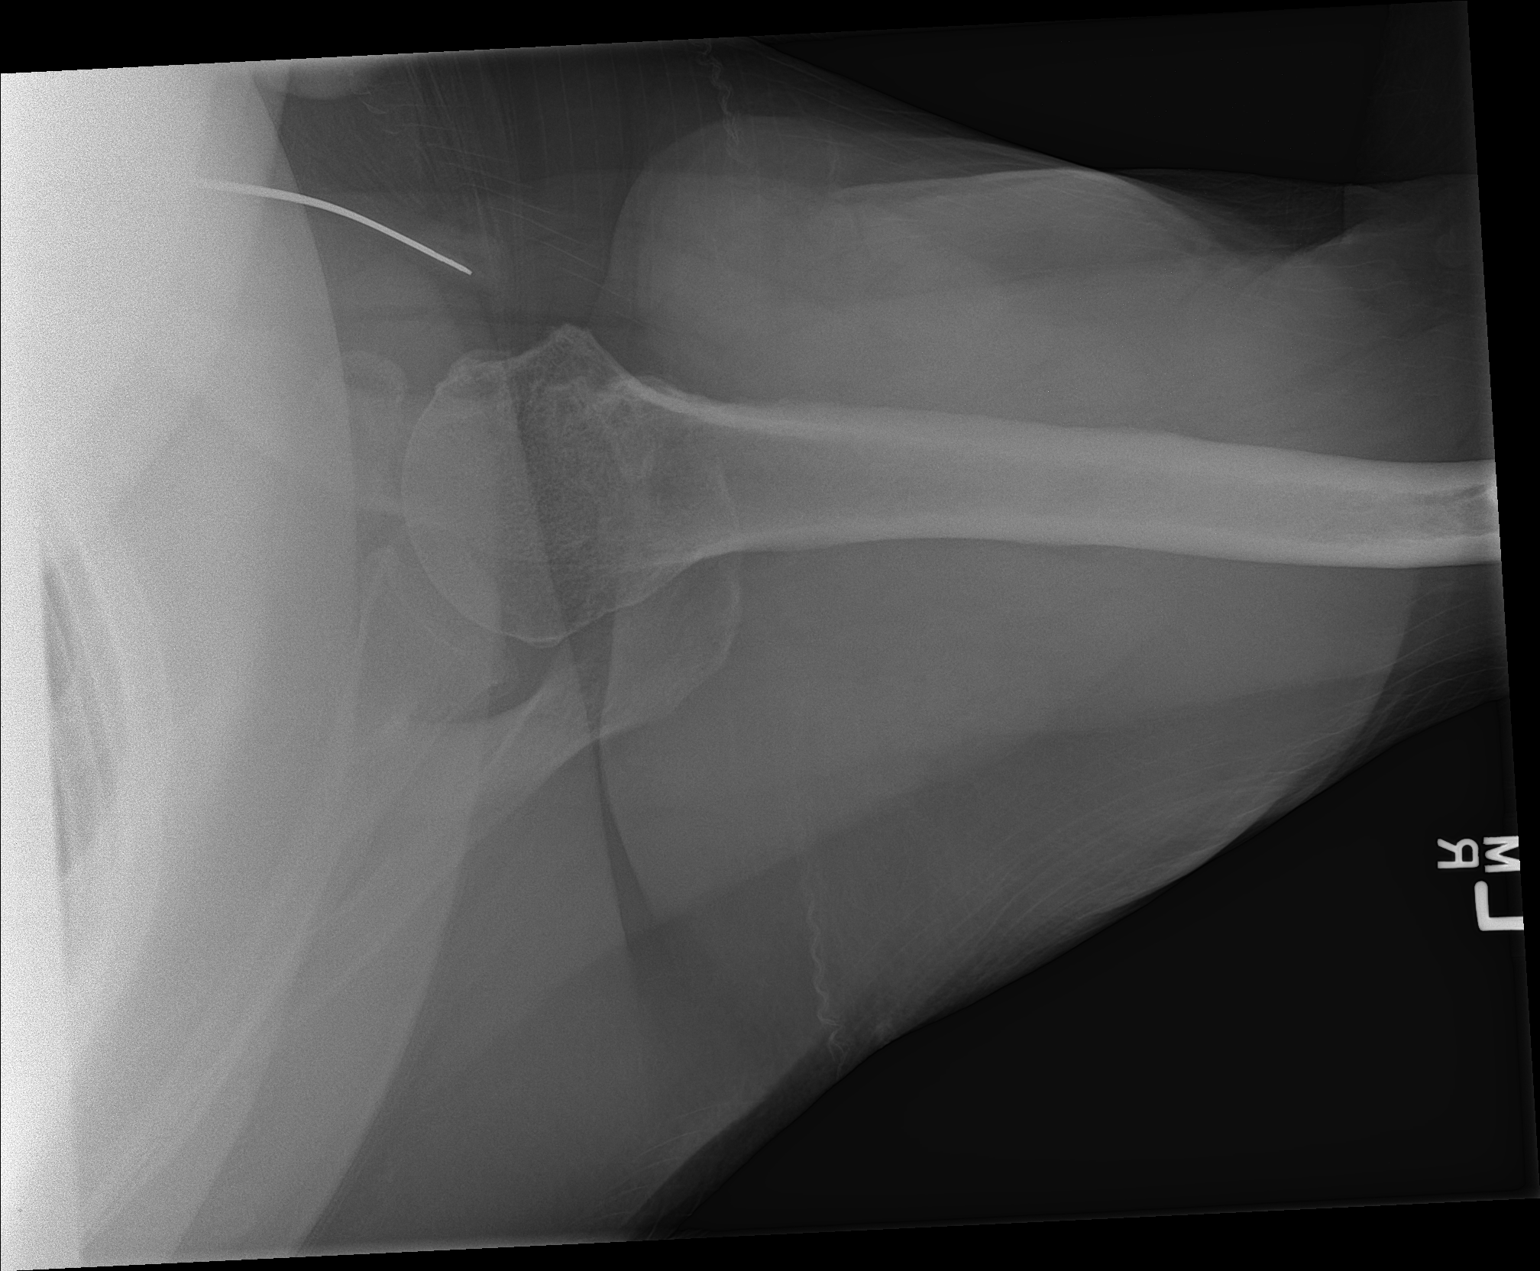

[3 of 3 positions shown; findings below may reference images not displayed]

FINDINGS: There is no evidence of fracture or dislocation. Severe degenerative
joint disease of the left acromioclavicular joint is noted. Soft
tissues are unremarkable.
IMPRESSION: Severe degenerative joint disease of the left acromioclavicular
joint. No acute abnormality seen in the left shoulder.

## 2018-12-27 IMAGING — DX DG HUMERUS 2V *L*
2 series · 2 of 2 positions shown · non-contrast
Comparison: 03/25/2016.

CLINICAL DATA: Fall.  Pain.

EXAM:
LEFT HUMERUS - 2+ VIEW

[humerus ap]
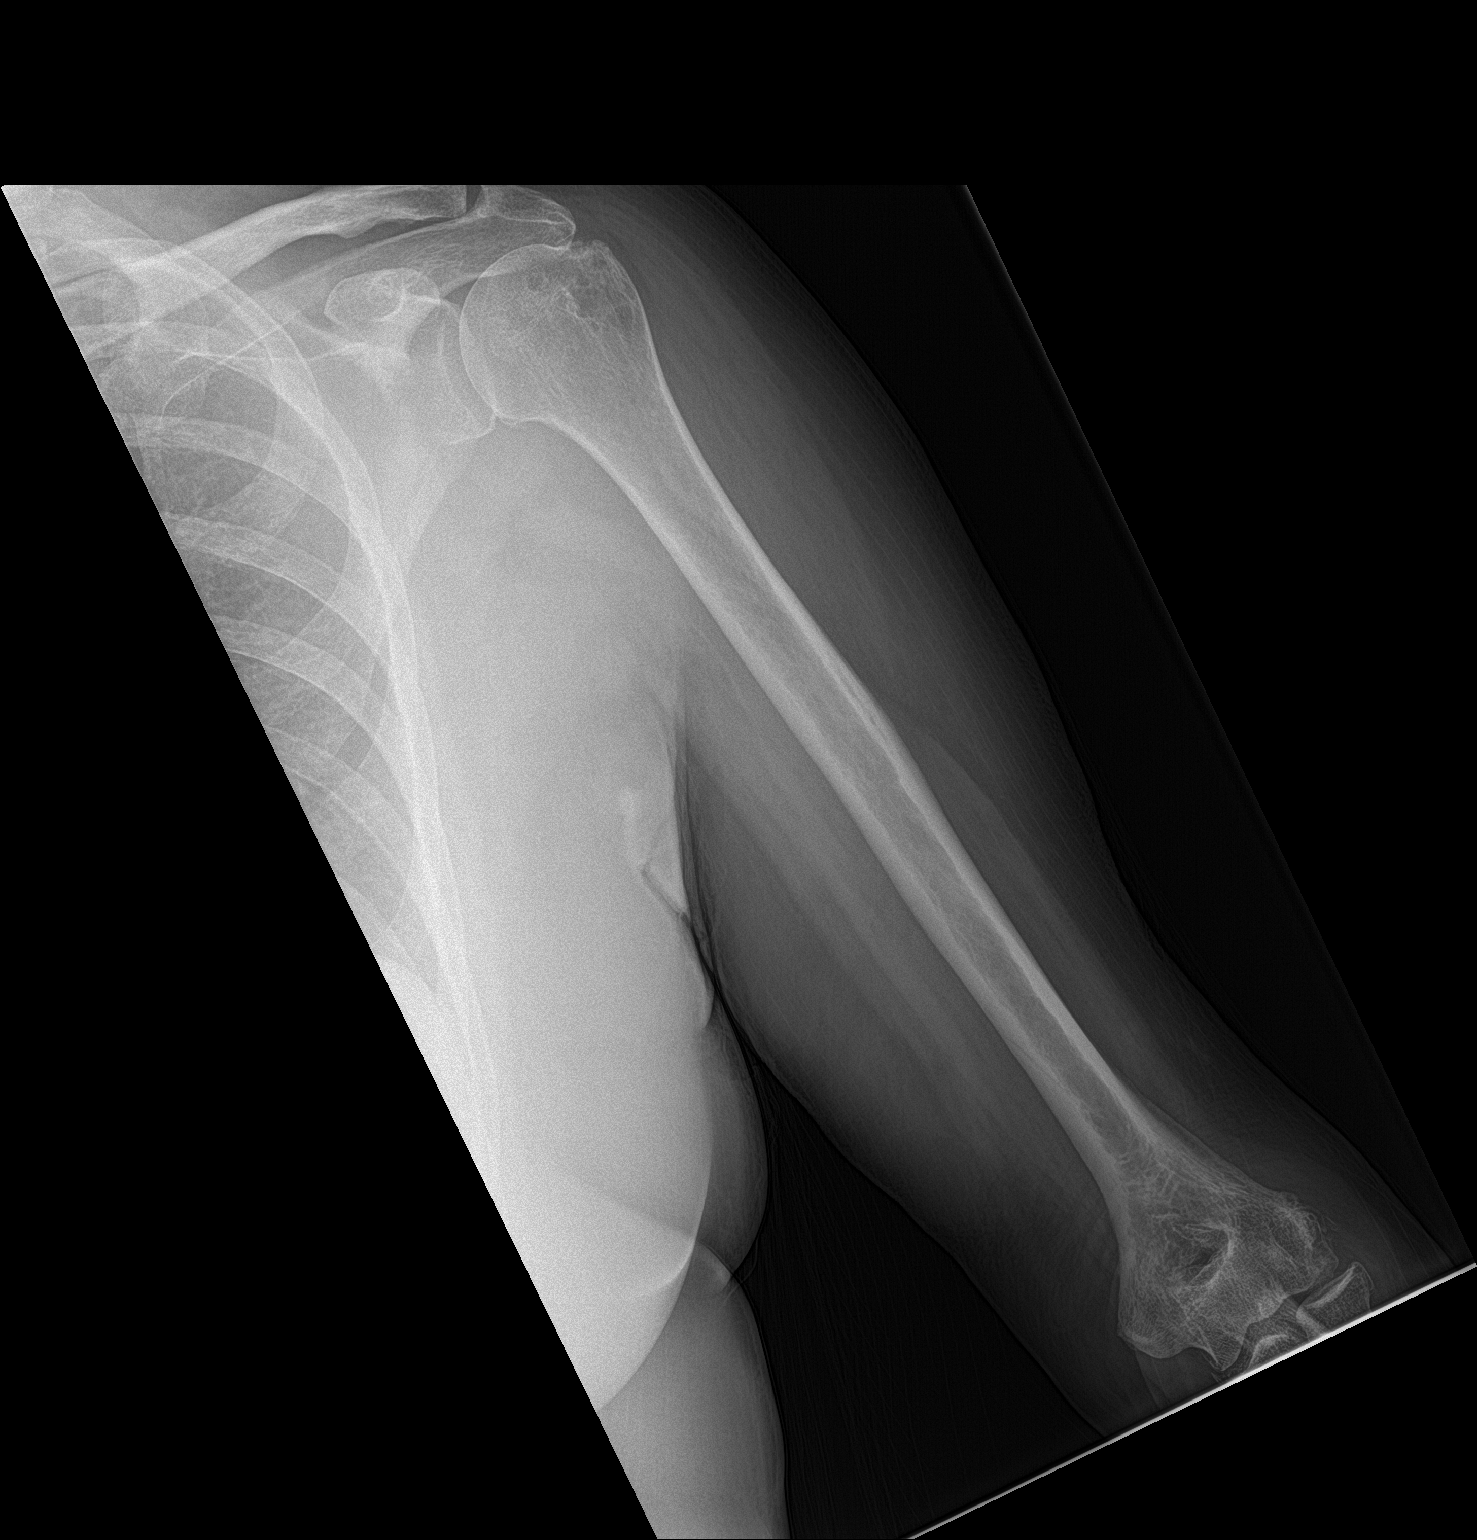

[humerus lat]
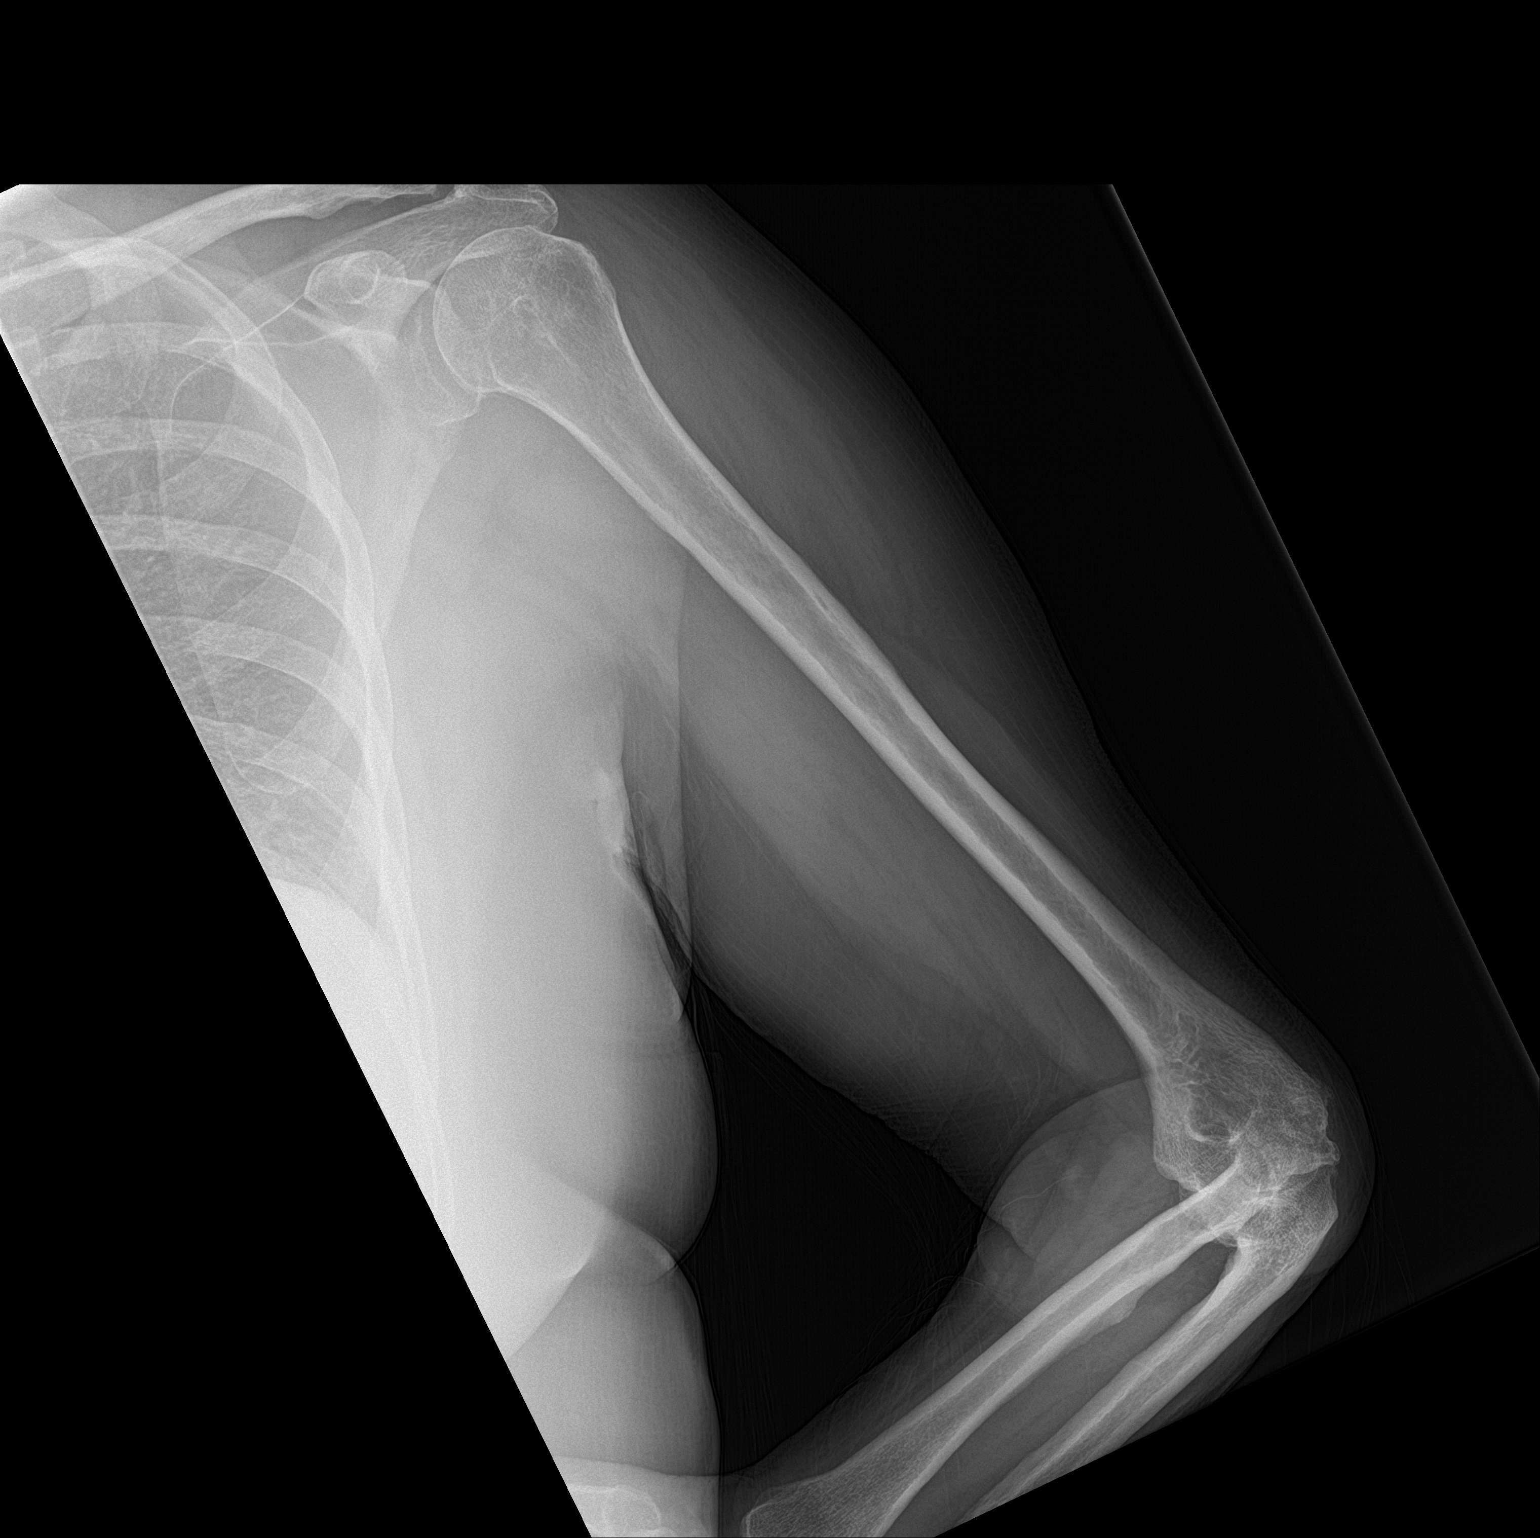

[2 of 2 positions shown; findings below may reference images not displayed]

FINDINGS: Acromioclavicular and glenohumeral degenerative change. No evidence
of fracture dislocation.
IMPRESSION: No acute abnormality .

## 2019-01-03 ENCOUNTER — Other Ambulatory Visit: Payer: Self-pay

## 2019-01-03 ENCOUNTER — Ambulatory Visit (HOSPITAL_COMMUNITY)
Admission: RE | Admit: 2019-01-03 | Discharge: 2019-01-03 | Disposition: A | Discharge status: Home / Self Care | Payer: Medicare Other | Source: Ambulatory Visit | Attending: Internal Medicine | Admitting: Internal Medicine

## 2019-01-03 DIAGNOSIS — Z13820 Encounter for screening for osteoporosis: Secondary | ICD-10-CM | POA: Diagnosis not present

## 2019-01-03 DIAGNOSIS — Z780 Asymptomatic menopausal state: Secondary | ICD-10-CM | POA: Diagnosis not present

## 2019-01-18 DIAGNOSIS — R519 Headache, unspecified: Secondary | ICD-10-CM | POA: Diagnosis not present

## 2019-01-18 DIAGNOSIS — R944 Abnormal results of kidney function studies: Secondary | ICD-10-CM | POA: Diagnosis not present

## 2019-01-18 DIAGNOSIS — R509 Fever, unspecified: Secondary | ICD-10-CM | POA: Diagnosis not present

## 2019-02-03 DIAGNOSIS — B359 Dermatophytosis, unspecified: Secondary | ICD-10-CM | POA: Diagnosis not present

## 2019-02-14 ENCOUNTER — Other Ambulatory Visit: Payer: Self-pay

## 2019-02-14 ENCOUNTER — Ambulatory Visit: Payer: Medicare Other | Attending: Internal Medicine

## 2019-02-14 DIAGNOSIS — Z20822 Contact with and (suspected) exposure to covid-19: Secondary | ICD-10-CM

## 2019-02-15 LAB — NOVEL CORONAVIRUS, NAA: SARS-CoV-2, NAA: NOT DETECTED

## 2019-03-16 ENCOUNTER — Ambulatory Visit: Payer: Medicare Other

## 2019-03-16 ENCOUNTER — Other Ambulatory Visit: Payer: Self-pay

## 2019-03-16 ENCOUNTER — Ambulatory Visit: Payer: Medicare Other | Admitting: Orthopedic Surgery

## 2019-03-16 ENCOUNTER — Encounter: Payer: Self-pay | Admitting: Orthopedic Surgery

## 2019-03-16 VITALS — Temp 97.2°F | Ht 65.0 in | Wt 195.0 lb

## 2019-03-16 DIAGNOSIS — M7521 Bicipital tendinitis, right shoulder: Secondary | ICD-10-CM | POA: Diagnosis not present

## 2019-03-16 DIAGNOSIS — M25511 Pain in right shoulder: Secondary | ICD-10-CM

## 2019-03-16 MED ORDER — MELOXICAM 7.5 MG PO TABS: 7.5000 mg | ORAL_TABLET | Freq: Every day | ORAL | 5 refills | Status: DC

## 2019-03-16 NOTE — Progress Notes (Signed)
Chief Complaint  Patient presents with  . Arm Pain    R/achy pain radiates down to fingers sometimes    69 year old female with 1-1/5-month history of pain in her right upper arm and anterior shoulder radiates to the elbow no history of trauma no prior treatments does note some painful range of motion in the right shoulder with some loss of motion in certain positions  Review of systems positive findings include tingling in the fingers pollen allergy  No other systems were listed as positive by the patient   Past Medical History:  Diagnosis Date  . Cancer (HCC)    melanoma of toe (big toe on left foot) removed  . Depression   . Diabetes mellitus without complication ([place])    dx 7-8 yrs  type 2  . GERD (gastroesophageal reflux disease)   . Hypertension    Temp (!) 97.2 F (36.2 C)   Ht 5\' 5"  (1.651 m)   Wt 195 lb (88.5 kg)   BMI 32.45 kg/m   Her appearance is normal she is oriented x3 her mood is pleasant her affect is normal she has no gait disturbances  Her left shoulder has no tenderness with abnormal range of motion and weakness from her previous cuff injury skin is intact neurovascular exam is normal  Right shoulder we see pain in the bicipital groove positive Speed test mild weakness right rotator cuff in abduction and flexion skin is intact neurovascular exam is normal impingement sign is negative including Hawkins maneuver  Encounter Diagnoses  Name Primary?  . Right shoulder pain, unspecified chronicity Yes  . Tendonitis of upper biceps tendon of right shoulder    Injection right biceps tendon  Procedure: Biceps tendon injection Right biceps tendon was injected The patient gave verbal consent for cortisone injection Timeout confirmed the site of injection Medications used included 40 mg of Depo-Medrol and 3 mL 1% lidocaine After alcohol and ethyl chloride preparation the point of maximal tenderness was injected over the right biceps tendon there were no  complications   Meds ordered this encounter  Medications  . meloxicam (MOBIC) 7.5 MG tablet    Sig: Take 1 tablet (7.5 mg total) by mouth daily.    Dispense:  30 tablet    Refill:  5    Acute uncomplicated illness injury with minimal complexity of data reviewed x-ray ordered and read prescription management

## 2019-03-16 NOTE — Patient Instructions (Addendum)
You have received an injection of steroids into the joint. 15% of patients will have increased pain within the 24 hours postinjection.   This is transient and will go away.   We recommend that you use ice packs on the injection site for 20 minutes every 2 hours and extra strength Tylenol 2 tablets every 8 as needed until the pain resolves.  If you continue to have pain after taking the Tylenol and using the ice please call the office for further instructions.   Proximal Biceps Tendinitis and Tenosynovitis  The proximal biceps tendon is a strong cord of tissue that connects the biceps muscle on the front of the upper arm to the shoulder blade. Tendinitis is inflammation of a tendon. Tenosynovitis is inflammation of the lining around the tendon (tendon sheath). These conditions often occur at the same time, and they can interfere with the ability to bend the elbow and turn the palm of the hand up. Proximal biceps tendinitis and tenosynovitis are usually caused by overusing the shoulder joint and the biceps muscle. These conditions usually heal within 6 weeks. Proximal biceps tendinitis may include a grade 1 or grade 2 strain of the tendon.  A grade 1 strain is mild, and it involves a slight pull of the tendon without any stretching or noticeable tearing of the tendon. There is usually no loss of biceps muscle strength.  A grade 2 strain is moderate, and it involves a small tear in the tendon. The tendon is stretched, and biceps strength is usually decreased. What are the causes? This condition may be caused by:  A sudden increase in frequency or intensity of activity that involves the shoulder and the biceps muscle.  Overuse of the biceps muscle. This can happen when you do the same movements over and over, such as: ? Turning the palm of the hand up. ? Forceful straightening (hyperextension) of the elbow. ? Bending the elbow.  A direct, forceful hit or injury to the elbow. This is  rare. What increases the risk? The following factors may make you more likely to develop this condition:  Playing contact sports.  Playing sports that involve throwing and overhead movements, including racket sports, gymnastics, weight lifting, or bodybuilding.  Doing physical labor.  Having poor strength and flexibility of the arm and shoulder. What are the signs or symptoms? Symptoms of this condition may include:  Pain and inflammation in the front of the shoulder.  A feeling of warmth in the front of the shoulder.  Limited range of motion of the shoulder and the elbow.  A crackling sound (crepitation) when you move or touch the shoulder or the upper arm. In some cases, symptoms may return after treatment, and they may be long-lasting (chronic). How is this diagnosed? This condition is diagnosed based on:  Your symptoms.  Your medical history.  Physical exam.  X-ray or MRI, if needed. How is this treated? Treatment for this condition depends on the severity of your injury. It may include:  Resting the injured arm.  Icing the injured area.  Doing physical therapy. Your health care provider may also use:  Medicines to treat pain and inflammation.  Sound waves to treat the injured muscle (ultrasound therapy).  Medicines that are injected to the muscle (corticosteroids).  Medicines that numb the area (local anesthetics).  Surgery. This is done if other treatments have not worked. Follow these instructions at home: Managing pain, stiffness, and swelling      If directed, put ice on  the injured area. ? Put ice in a plastic bag. ? Place a towel between your skin and the bag. ? Leave the ice on for 20 minutes, 2-3 times a day.  If directed, apply heat to the affected area before you exercise. Use the heat source that your health care provider recommends, such as a moist heat pack or a heating pad. ? Place a towel between your skin and the heat  source. ? Leave the heat on for 20-30 minutes. ? Remove the heat if your skin turns bright red. This is especially important if you are unable to feel pain, heat, or cold. You may have a greater risk of getting burned.  Move your fingers often to reduce stiffness and swelling.  Raise (elevate) the injured area above the level of your heart while you are lying down. Activity  Do not lift anything that is heavier than 10 lb (4.5 kg), or the limit that you are told, until your health care provider says that it is safe.  Avoid activities that cause pain or make your condition worse.  Return to your normal activities as told by your health care provider. Ask your health care provider what activities are safe for you.  Do exercises as told by your health care provider. General instructions  Take over-the-counter and prescription medicines only as told by your health care provider.  Do not use any products that contain nicotine or tobacco, such as cigarettes, e-cigarettes, and chewing tobacco. These can delay healing. If you need help quitting, ask your health care provider.  Keep all follow-up visits as told by your health care provider. This is important. How is this prevented?  Warm up and stretch before being active.  Cool down and stretch after being active.  Give your body time to rest between periods of activity.  Make sure any equipment that you use is fitted to you.  Be safe and responsible while being active to avoid falls.  Maintain physical fitness, including: ? Strength. ? Flexibility. ? Heart health (cardiovascular fitness). ? The ability to use muscles for a long time (endurance). Contact a health care provider if:  You have symptoms that get worse or do not get better after 2 weeks of treatment.  You develop new symptoms. Get help right away if:  You develop severe pain. Summary  Tendinitis is inflammation of the biceps tendon. Tenosynovitis is inflammation  of the lining around the biceps tendon. These conditions often occur at the same time.  These conditions are usually caused by overusing the shoulder joint and biceps muscle.  Symptoms include pain, warmth in the shoulder, and limited range of motion.  The two conditions are treated with rest, ice, medicines, and surgery (rare). This information is not intended to replace advice given to you by your health care provider. Make sure you discuss any questions you have with your health care provider. Document Revised: 04/20/2018 Document Reviewed: 02/18/2018 Elsevier Patient Education  [place].    Shoulder Range of Motion Exercises Shoulder range of motion (ROM) exercises are done to keep the shoulder moving freely or to increase movement. They are often recommended for people who have shoulder pain or stiffness or who are recovering from a shoulder surgery. Phase 1 exercises When you are able, do this exercise 1-2 times per day for 30-60 seconds in each direction, or as directed by your health care provider. Pendulum exercise To do this exercise while sitting: 1. Sit in a chair or at the  edge of your bed with your feet flat on the floor. 2. Let your affected arm hang down in front of you over the edge of the bed or chair. 3. Relax your shoulder, arm, and hand. Ball [place] your body so your arm gently swings in small circles. You can also use your unaffected arm to start the motion. 5. Repeat changing the direction of the circles, swinging your arm left and right, and swinging your arm forward and back. To do this exercise while standing: 1. Stand next to a sturdy chair or table, and hold on to it with your hand on your unaffected side. 2. Bend forward at the waist. 3. Bend your knees slightly. 4. Relax your shoulder, arm, and hand. 5. While keeping your shoulder relaxed, use body motion to swing your arm in small circles. 6. Repeat changing the direction of the circles, swinging  your arm left and right, and swinging your arm forward and back. 7. Between exercises, stand up tall and take a short break to relax your lower back.  Phase 2 exercises Do these exercises 1-2 times per day or as told by your health care provider. Hold each stretch for 30 seconds, and repeat 3 times. Do the exercises with one or both arms as instructed by your health care provider. For these exercises, sit at a table with your hand and arm supported by the table. A chair that slides easily or has wheels can be helpful. External rotation 1. Turn your chair so that your affected side is nearest to the table. 2. Place your forearm on the table to your side. Bend your elbow about 90 at the elbow (right angle) and place your hand palm facing down on the table. Your elbow should be about 6 inches away from your side. 3. Keeping your arm on the table, lean your body forward. Abduction 1. Turn your chair so that your affected side is nearest to the table. 2. Place your forearm and hand on the table so that your thumb points toward the ceiling and your arm is straight out to your side. 3. Slide your hand out to the side and away from you, using your unaffected arm to do the work. 4. To increase the stretch, you can slide your chair away from the table. Flexion: forward stretch 1. Sit facing the table. Place your hand and elbow on the table in front of you. 2. Slide your hand forward and away from you, using your unaffected arm to do the work. 3. To increase the stretch, you can slide your chair backward. Phase 3 exercises Do these exercises 1-2 times per day or as told by your health care provider. Hold each stretch for 30 seconds, and repeat 3 times. Do the exercises with one or both arms as instructed by your health care provider. Cross-body stretch: posterior capsule stretch 1. Lift your arm straight out in front of you. 2. Bend your arm 90 at the elbow (right angle) so your forearm moves across  your body. 3. Use your other arm to gently pull the elbow across your body, toward your other shoulder. Wall climbs 1. Stand with your affected arm extended out to the side with your hand resting on a door frame. 2. Slide your hand slowly up the door frame. 3. To increase the stretch, step through the door frame. Keep your body upright and do not lean. Wand exercises You will need a cane, a piece of PVC pipe, or a sturdy wooden dowel  for wand exercises. Flexion To do this exercise while standing: 1. Hold the wand with both of your hands, palms down. 2. Using the other arm to help, lift your arms up and over your head, if able. 3. Push upward with your other arm to gently increase the stretch. To do this exercise while lying down: 1. Lie on your back with your elbows resting on the floor and the wand in both your hands. Your hands will be palm down, or pointing toward your feet. 2. Lift your hands toward the ceiling, using your unaffected arm to help if needed. 3. Bring your arms overhead as able, using your unaffected arm to help if needed. Internal rotation 1. Stand while holding the wand behind you with both hands. Your unaffected arm should be extended above your head with the arm of the affected side extended behind you at the level of your waist. The wand should be pointing straight up and down as you hold it. 2. Slowly pull the wand up behind your back by straightening the elbow of your unaffected arm and bending the elbow of your affected arm. External rotation 1. Lie on your back with your affected upper arm supported on a small pillow or rolled towel. When you first do this exercise, keep your upper arm close to your body. Over time, bring your arm up to a 90 angle out to the side. 2. Hold the wand across your stomach and with both hands palm up. Your elbow on your affected side should be bent at a 90 angle. 3. Use your unaffected side to help push your forearm away from you and  toward the floor. Keep your elbow on your affected side bent at a 90 angle. Contact a health care provider if you have:  New or increasing pain.  New numbness, tingling, weakness, or discoloration in your arm or hand. This information is not intended to replace advice given to you by your health care provider. Make sure you discuss any questions you have with your health care provider. Document Revised: 02/04/2017 Document Reviewed: 02/04/2017 Elsevier Patient Education  2020 [employer].

## 2019-04-19 DIAGNOSIS — Z23 Encounter for immunization: Secondary | ICD-10-CM | POA: Diagnosis not present

## 2019-04-27 ENCOUNTER — Ambulatory Visit: Payer: Medicare Other | Admitting: Orthopedic Surgery

## 2019-05-17 DIAGNOSIS — Z23 Encounter for immunization: Secondary | ICD-10-CM | POA: Diagnosis not present

## 2019-05-19 DIAGNOSIS — B369 Superficial mycosis, unspecified: Secondary | ICD-10-CM | POA: Diagnosis not present

## 2019-05-19 DIAGNOSIS — E559 Vitamin D deficiency, unspecified: Secondary | ICD-10-CM | POA: Diagnosis not present

## 2019-05-19 DIAGNOSIS — E1122 Type 2 diabetes mellitus with diabetic chronic kidney disease: Secondary | ICD-10-CM | POA: Diagnosis not present

## 2019-05-19 DIAGNOSIS — B3784 Candidal otitis externa: Secondary | ICD-10-CM | POA: Diagnosis not present

## 2019-05-19 DIAGNOSIS — B359 Dermatophytosis, unspecified: Secondary | ICD-10-CM | POA: Diagnosis not present

## 2019-05-19 DIAGNOSIS — E1165 Type 2 diabetes mellitus with hyperglycemia: Secondary | ICD-10-CM | POA: Diagnosis not present

## 2019-05-23 ENCOUNTER — Ambulatory Visit: Payer: Medicare Other | Admitting: Orthopedic Surgery

## 2019-05-25 DIAGNOSIS — E782 Mixed hyperlipidemia: Secondary | ICD-10-CM | POA: Diagnosis not present

## 2019-05-25 DIAGNOSIS — Z0000 Encounter for general adult medical examination without abnormal findings: Secondary | ICD-10-CM | POA: Diagnosis not present

## 2019-05-25 DIAGNOSIS — K219 Gastro-esophageal reflux disease without esophagitis: Secondary | ICD-10-CM | POA: Diagnosis not present

## 2019-05-25 DIAGNOSIS — I129 Hypertensive chronic kidney disease with stage 1 through stage 4 chronic kidney disease, or unspecified chronic kidney disease: Secondary | ICD-10-CM | POA: Diagnosis not present

## 2019-05-25 DIAGNOSIS — E1122 Type 2 diabetes mellitus with diabetic chronic kidney disease: Secondary | ICD-10-CM | POA: Diagnosis not present

## 2019-05-31 ENCOUNTER — Ambulatory Visit: Payer: Medicare Other

## 2019-05-31 ENCOUNTER — Ambulatory Visit: Payer: Medicare Other | Admitting: Orthopedic Surgery

## 2019-05-31 ENCOUNTER — Other Ambulatory Visit: Payer: Self-pay

## 2019-05-31 VITALS — Ht 65.0 in | Wt 195.0 lb

## 2019-05-31 DIAGNOSIS — M25552 Pain in left hip: Secondary | ICD-10-CM

## 2019-05-31 DIAGNOSIS — M7521 Bicipital tendinitis, right shoulder: Secondary | ICD-10-CM

## 2019-05-31 NOTE — Progress Notes (Signed)
Chief Complaint  Patient presents with  . Shoulder Pain    Recheck on right shoulder  . Hip Pain    left ischial area painful with sitting      69 year old female status post injection for bicipital tendinitis. 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 Cancer (HCC)    melanoma of toe (big toe on left foot) removed  . Depression   . Diabetes mellitus without complication ([place])    dx 7-8 yrs  type 2  . GERD (gastroesophageal reflux disease)   . Hypertension     Physical Exam  She is awake alert and oriented x3 normal development grooming and hygiene  She walks without assistive device no evidence of a limp  Her right arm shows full forward elevation good strength of the rotator cuff mild pain with the speeds maneuver for biceps pathology mild tenderness over the biceps tendon in the bicipital groove but normal forward elevation negative impingement  Left leg tenderness right at the ischium without radiation. Range of motion of the hip is normal  Pelvic x-ray normal  Probable sciatic nerve pressure on the ischium  Bicipital tendinitis improved  Follow-up as needed for the above-mentioned problems she will use a pillow when sitting and use some BenGay or topical medication for the shoulder  She is interested in a right total knee and will come back when she is ready to entertain that  Encounter Diagnoses  Name Primary?  . Left-sided ischial pain Yes  . Tendonitis of upper biceps tendon of right shoulder

## 2019-05-31 NOTE — Patient Instructions (Signed)
Use Aspercreme, Biofreeze or Voltaren gel over the counter 2-3 times daily make sure you rub it in well each time you use it.   For the hip / buttock pain sitting will make it worse, try sitting on a pillow

## 2019-07-26 ENCOUNTER — Encounter: Payer: Self-pay | Admitting: Adult Health

## 2019-07-26 ENCOUNTER — Other Ambulatory Visit: Payer: Self-pay

## 2019-07-26 ENCOUNTER — Ambulatory Visit: Payer: Medicare Other | Admitting: Adult Health

## 2019-07-26 VITALS — BP 113/69 | HR 64 | Ht 67.0 in | Wt 195.0 lb

## 2019-07-26 DIAGNOSIS — L900 Lichen sclerosus et atrophicus: Secondary | ICD-10-CM | POA: Insufficient documentation

## 2019-07-26 DIAGNOSIS — L292 Pruritus vulvae: Secondary | ICD-10-CM | POA: Insufficient documentation

## 2019-07-26 DIAGNOSIS — B029 Zoster without complications: Secondary | ICD-10-CM | POA: Diagnosis not present

## 2019-07-26 MED ORDER — CLOBETASOL PROP EMOLLIENT BASE 0.05 % EX CREA: TOPICAL_CREAM | CUTANEOUS | 3 refills | Status: DC

## 2019-07-26 NOTE — Progress Notes (Signed)
Patient ID: Gloria Rose, female   DOB: 1950/08/24, 70 y.o.   MRN: [id number] History of Present Illness: Gloria Rose is a 69 year old white female, married,PM in complaining of ?rash and itching of vulva for 3 months now, usually in am and pm PCP is Dr Nevada Crane.   Current Medications, Allergies, Past Medical History, Past Surgical History, Family History and Social History were reviewed in [employer] record.     Review of Systems: Has ?rash on vulva, has itching for about 3 months now, occurs in am after bath and in pm, sometimes anal area itches too Still has sex occasionally  Has rash on face now, itches and burns for 2 days, looks like early  shingles, has appt with PCP at 10:40 today     Physical Exam:BP 113/69 (BP Location: Left Arm, Patient Position: Sitting, Cuff Size: Normal)    Pulse 64    Ht 5\' 7"  (1.702 m)    Wt 195 lb (88.5 kg)    BMI 30.54 kg/m  General:  Well developed, well nourished, no acute distress Skin:  Warm and dry,has red raised areas right temple to forehead, near eye and some mild swell near eye, looks like shingles, has appt with with PCP at 10:40 today  Lungs; Clear to auscultation bilaterally Cardiovascular: Regular rate and rhythm Pelvic:  External genitalia, has thickened skin of vulva with mild redness  The vagina is pale with loss of moisture and rugae. Urethra has no lesions or masses. The cervix is atrophic.  Uterus is felt to be normal size, shape, and contour.  No adnexal masses or tenderness noted.Bladder is non tender, no masses felt. Rectal area, has hemorrhoid remnant and some tear  Psych:  No mood changes, alert and cooperative,seems happy AA is 0 Fall risk is low PHQ 9 score is 5, no SI Examination chaperoned by Celene Squibb LPN  Impression and Plan:  1. Vulvar itching  2. Lichen sclerosus Discussed with Gloria Rose this is chronic, will treat with temovate and showed her pictures in [facility] Review handout on  LS Meds ordered this encounter  Medications   Clobetasol Prop Emollient Base 0.05 % emollient cream    Sig: Use daily to affected area for 2 weeks then 2-3 x weekly    Dispense:  30 g    Refill:  3    Order Specific Question:   Supervising Provider    Answer:   Tania Ade H [[id number]]  Follow up in 4 weeks for recheck See PCP today about rash on face,looks like early shingles

## 2019-07-26 NOTE — Patient Instructions (Signed)
Lichen Sclerosus Lichen sclerosus is a skin problem. It can happen on any part of the body, but it commonly involves the anal or genital areas. It can cause itching and discomfort in these areas. Treatment can help to control symptoms. When the genital area is affected, getting treatment is important because the condition can cause scarring that may lead to other problems. What are the causes? The cause of this condition is not known. It may be related to an overactive immune system or a lack of certain hormones. Lichen sclerosus is not an infection or a fungus, and it is not passed from one person to another (not contagious). What increases the risk? This condition is more likely to develop in women, usually after menopause. What are the signs or symptoms? Symptoms of this condition include:  Thin, wrinkled, white areas on the skin.  Thickened white areas on the skin.  Red and swollen patches (lesions) on the skin.  Tears or cracks in the skin.  Bruising.  Blood blisters.  Severe itching.  Pain, itching, or burning when urinating. Constipation is also common in people with lichen sclerosus. How is this diagnosed? This condition may be diagnosed with a physical exam. In some cases, a tissue sample (biopsy sample) may be removed to be looked at under a microscope. How is this treated? This condition is usually treated with medicated creams or ointments (topical steroids) that are applied over the affected areas. In some cases, treatment may also include medicines that are taken by mouth. Surgery may be needed in more severe cases that are causing problems such as scarring. Follow these instructions at home:  Take or use over-the-counter and prescription medicines only as told by your health care provider.  Use creams or ointments as told by your health care provider.  Do not scratch the affected areas of skin.  If you are a woman, be sure to keep the vaginal area as clean and dry  as possible.  Clean the affected area of skin gently with water. Avoid using rough towels or toilet paper.  Keep all follow-up visits as told by your health care provider. This is important. Contact a health care provider if:  You have increasing redness, swelling, or pain in the affected area.  You have fluid, blood, or pus coming from the affected area.  You have new lesions on your skin.  You have a fever.  You have pain during sex. Summary  Lichen sclerosus is a skin problem. When the genital area is affected, getting treatment is important because the condition can cause scarring that may lead to other problems.  This condition is usually treated with medicated creams or ointments (topical steroids) that are applied over the affected areas.  Take or use over-the-counter and prescription medicines only as told by your health care provider.  Contact a health care provider if you have new lesions on your skin, have pain during sex, or have increasing redness, swelling, or pain in the affected area.  Keep all follow-up visits as told by your health care provider. This is important. This information is not intended to replace advice given to you by your health care provider. Make sure you discuss any questions you have with your health care provider. Document Revised: 05/07/2017 Document Reviewed: 05/07/2017 Elsevier Patient Education  2020 Elsevier Inc.  

## 2019-08-23 ENCOUNTER — Ambulatory Visit: Payer: Medicare Other | Admitting: Adult Health

## 2019-09-06 DIAGNOSIS — B9689 Other specified bacterial agents as the cause of diseases classified elsewhere: Secondary | ICD-10-CM | POA: Diagnosis not present

## 2019-09-06 DIAGNOSIS — Z85820 Personal history of malignant melanoma of skin: Secondary | ICD-10-CM | POA: Diagnosis not present

## 2019-09-06 DIAGNOSIS — L0202 Furuncle of face: Secondary | ICD-10-CM | POA: Diagnosis not present

## 2019-09-06 DIAGNOSIS — L218 Other seborrheic dermatitis: Secondary | ICD-10-CM | POA: Diagnosis not present

## 2019-09-06 DIAGNOSIS — L304 Erythema intertrigo: Secondary | ICD-10-CM | POA: Diagnosis not present

## 2019-11-16 DIAGNOSIS — R509 Fever, unspecified: Secondary | ICD-10-CM | POA: Diagnosis not present

## 2019-11-16 DIAGNOSIS — B359 Dermatophytosis, unspecified: Secondary | ICD-10-CM | POA: Diagnosis not present

## 2019-11-16 DIAGNOSIS — E1122 Type 2 diabetes mellitus with diabetic chronic kidney disease: Secondary | ICD-10-CM | POA: Diagnosis not present

## 2019-11-16 DIAGNOSIS — R944 Abnormal results of kidney function studies: Secondary | ICD-10-CM | POA: Diagnosis not present

## 2019-11-16 DIAGNOSIS — E1165 Type 2 diabetes mellitus with hyperglycemia: Secondary | ICD-10-CM | POA: Diagnosis not present

## 2019-11-22 DIAGNOSIS — Z0001 Encounter for general adult medical examination with abnormal findings: Secondary | ICD-10-CM | POA: Diagnosis not present

## 2019-11-22 DIAGNOSIS — Z23 Encounter for immunization: Secondary | ICD-10-CM | POA: Diagnosis not present

## 2019-11-22 DIAGNOSIS — E782 Mixed hyperlipidemia: Secondary | ICD-10-CM | POA: Diagnosis not present

## 2019-12-08 ENCOUNTER — Other Ambulatory Visit (HOSPITAL_COMMUNITY): Payer: Self-pay | Admitting: Internal Medicine

## 2019-12-08 DIAGNOSIS — Z1231 Encounter for screening mammogram for malignant neoplasm of breast: Secondary | ICD-10-CM

## 2019-12-19 DIAGNOSIS — R051 Acute cough: Secondary | ICD-10-CM | POA: Diagnosis not present

## 2019-12-19 DIAGNOSIS — R0981 Nasal congestion: Secondary | ICD-10-CM | POA: Diagnosis not present

## 2019-12-19 DIAGNOSIS — R519 Headache, unspecified: Secondary | ICD-10-CM | POA: Diagnosis not present

## 2019-12-19 DIAGNOSIS — R509 Fever, unspecified: Secondary | ICD-10-CM | POA: Diagnosis not present

## 2020-05-09 ENCOUNTER — Telehealth: Payer: Self-pay | Admitting: Adult Health

## 2020-05-09 MED ORDER — CLOBETASOL PROP EMOLLIENT BASE 0.05 % EX CREA
TOPICAL_CREAM | CUTANEOUS | 3 refills | Status: DC
Start: 2020-05-09 — End: 2020-10-11

## 2020-05-09 NOTE — Telephone Encounter (Signed)
Pt requesting refill on Clobetasol Prop Emollient Base 0.05 % emollient cream sent to [facility] in [place]

## 2020-05-09 NOTE — Addendum Note (Signed)
Addended by: Derrek Monaco A on: 05/09/2020 05:13 PM   Modules accepted: Orders

## 2020-05-09 NOTE — Telephone Encounter (Signed)
Refill temovate

## 2020-07-22 DIAGNOSIS — U071 COVID-19: Secondary | ICD-10-CM | POA: Diagnosis not present

## 2020-08-06 DIAGNOSIS — F411 Generalized anxiety disorder: Secondary | ICD-10-CM | POA: Insufficient documentation

## 2020-08-06 DIAGNOSIS — E782 Mixed hyperlipidemia: Secondary | ICD-10-CM | POA: Diagnosis not present

## 2020-08-06 DIAGNOSIS — I10 Essential (primary) hypertension: Secondary | ICD-10-CM | POA: Diagnosis not present

## 2020-08-06 DIAGNOSIS — Z0000 Encounter for general adult medical examination without abnormal findings: Secondary | ICD-10-CM | POA: Diagnosis not present

## 2020-08-06 DIAGNOSIS — Z780 Asymptomatic menopausal state: Secondary | ICD-10-CM | POA: Diagnosis not present

## 2020-08-06 DIAGNOSIS — E669 Obesity, unspecified: Secondary | ICD-10-CM | POA: Diagnosis not present

## 2020-08-06 DIAGNOSIS — E1159 Type 2 diabetes mellitus with other circulatory complications: Secondary | ICD-10-CM | POA: Diagnosis not present

## 2020-08-06 DIAGNOSIS — Z1231 Encounter for screening mammogram for malignant neoplasm of breast: Secondary | ICD-10-CM | POA: Diagnosis not present

## 2020-08-06 DIAGNOSIS — Z23 Encounter for immunization: Secondary | ICD-10-CM | POA: Diagnosis not present

## 2020-08-06 DIAGNOSIS — Z1211 Encounter for screening for malignant neoplasm of colon: Secondary | ICD-10-CM | POA: Diagnosis not present

## 2020-08-14 ENCOUNTER — Other Ambulatory Visit (HOSPITAL_COMMUNITY): Payer: Self-pay | Admitting: Adult Health Nurse Practitioner

## 2020-08-14 ENCOUNTER — Encounter: Payer: Self-pay | Admitting: Internal Medicine

## 2020-08-14 DIAGNOSIS — Z1231 Encounter for screening mammogram for malignant neoplasm of breast: Secondary | ICD-10-CM

## 2020-08-14 DIAGNOSIS — Z780 Asymptomatic menopausal state: Secondary | ICD-10-CM

## 2020-09-28 DIAGNOSIS — U071 COVID-19: Secondary | ICD-10-CM | POA: Diagnosis not present

## 2020-10-05 DIAGNOSIS — Z20822 Contact with and (suspected) exposure to covid-19: Secondary | ICD-10-CM | POA: Diagnosis not present

## 2020-10-06 DIAGNOSIS — Z20822 Contact with and (suspected) exposure to covid-19: Secondary | ICD-10-CM | POA: Diagnosis not present

## 2020-10-11 ENCOUNTER — Encounter: Payer: Self-pay | Admitting: *Deleted

## 2020-10-11 ENCOUNTER — Ambulatory Visit (INDEPENDENT_AMBULATORY_CARE_PROVIDER_SITE_OTHER): Payer: Self-pay | Admitting: *Deleted

## 2020-10-11 ENCOUNTER — Other Ambulatory Visit: Payer: Self-pay

## 2020-10-11 VITALS — Ht 67.0 in | Wt 185.0 lb

## 2020-10-11 DIAGNOSIS — Z1211 Encounter for screening for malignant neoplasm of colon: Secondary | ICD-10-CM

## 2020-10-11 MED ORDER — PEG 3350-KCL-NA BICARB-NACL 420 G PO SOLR: 4000.0000 mL | Freq: Once | ORAL | 0 refills | Status: AC

## 2020-10-11 NOTE — Progress Notes (Addendum)
Gastroenterology Pre-Procedure Review  Request Date: 10/11/2020 Requesting Physician: Flippin, Last TCS 10 yrs ago done by Dr. Margaretmary Eddy in [facility], polyps per pt  PATIENT REVIEW QUESTIONS: The patient responded to the following health history questions as indicated:    1. Diabetes Melitis: no 2. Joint replacements in the past 12 months: no 3. Major health problems in the past 3 months: no 4. Has an artificial valve or MVP: no 5. Has a defibrillator: no 6. Has been advised in past to take antibiotics in advance of a procedure like teeth cleaning: no 7. Family history of colon cancer: no  8. Alcohol Use: no 9. Illicit drug Use: no 10. History of sleep apnea: no  11. History of coronary artery or other vascular stents placed within the last 12 months: no 12. History of any prior anesthesia complications: no 13. Body mass index is 28.98 kg/m.    MEDICATIONS & ALLERGIES:    Patient reports the following regarding taking any blood thinners:   Plavix? no  Aspirin? Yes, 81 mg Coumadin? no Brilinta? no Xarelto? no Eliquis? no Pradaxa? no Savaysa? no Effient? no  Patient confirms/reports the following medications:  Current Outpatient Medications  Medication Sig Dispense Refill   amLODipine (NORVASC) 5 MG tablet Take 5 mg by mouth daily.     aspirin EC 81 MG tablet Take 81 mg by mouth daily.     atenolol (TENORMIN) 100 MG tablet Take 100 mg by mouth daily.     hydrochlorothiazide (HYDRODIURIL) 25 MG tablet Take 25 mg by mouth daily.     lisinopril (PRINIVIL,ZESTRIL) 20 MG tablet Take 20 mg by mouth daily.     omeprazole (PRILOSEC) 20 MG capsule Take 20 mg by mouth daily.     PARoxetine (PAXIL) 40 MG tablet Take 40 mg by mouth 2 (two) times daily.      pravastatin (PRAVACHOL) 40 MG tablet Take 40 mg by mouth daily.     No current facility-administered medications for this visit.    Patient confirms/reports the following allergies:  Allergies   Allergen Reactions   Penicillins Anaphylaxis and Hives    Difficulty breathing    No orders of the defined types were placed in this encounter.   AUTHORIZATION INFORMATION Primary Insurance: Medicare,  ID#:  Pre-Cert / Auth required: [id number] Pre-Cert / Auth #: No, not required  Secondary Insurance: [employer],  ID #: [id number],  Group #: Plan [id number] Pre-Cert / Auth required: No, not required  SCHEDULE INFORMATION: Procedure has been scheduled as follows:  Date: 10/23/2020, Time: 10:00  Location: [facility] with Dr. Abbey Chatters  This Gastroenterology Pre-Precedure Review Form is being routed to the following provider(s):  Roseanne Kaufman, NP

## 2020-10-16 NOTE — Progress Notes (Signed)
ASA 2. Appropriate.  ?

## 2020-10-17 ENCOUNTER — Other Ambulatory Visit: Payer: Self-pay | Admitting: *Deleted

## 2020-10-17 DIAGNOSIS — Z1211 Encounter for screening for malignant neoplasm of colon: Secondary | ICD-10-CM

## 2020-10-17 NOTE — Progress Notes (Signed)
Sent records request on 10/11/2020 and again today 10/17/2020.  Called to follow up.  Spoke with Danae Chen and was informed that no patient found in their database.  Unable to retrieve last TCS report.

## 2020-10-19 ENCOUNTER — Other Ambulatory Visit: Payer: Self-pay

## 2020-10-19 ENCOUNTER — Other Ambulatory Visit (HOSPITAL_COMMUNITY)
Admission: RE | Admit: 2020-10-19 | Discharge: 2020-10-19 | Disposition: A | Discharge status: Home / Self Care | Payer: Medicare Other | Source: Ambulatory Visit | Attending: Internal Medicine | Admitting: Internal Medicine

## 2020-10-19 DIAGNOSIS — Z1211 Encounter for screening for malignant neoplasm of colon: Secondary | ICD-10-CM | POA: Insufficient documentation

## 2020-10-19 LAB — BASIC METABOLIC PANEL
Anion gap: 6 (ref 5–15)
BUN: 29 mg/dL — ABNORMAL HIGH (ref 8–23)
CO2: 26 mmol/L (ref 22–32)
Calcium: 9.2 mg/dL (ref 8.9–10.3)
Chloride: 102 mmol/L (ref 98–111)
Creatinine, Ser: 1 mg/dL (ref 0.44–1.00)
GFR, Estimated: 60 mL/min — ABNORMAL LOW (ref >60)
Glucose, Bld: 118 mg/dL — ABNORMAL HIGH (ref 70–99)
Potassium: 4 mmol/L (ref 3.5–5.1)
Sodium: 134 mmol/L — ABNORMAL LOW (ref 135–145)

## 2020-10-23 ENCOUNTER — Ambulatory Visit (HOSPITAL_COMMUNITY): Payer: Medicare Other | Admitting: Anesthesiology

## 2020-10-23 ENCOUNTER — Other Ambulatory Visit: Payer: Self-pay

## 2020-10-23 ENCOUNTER — Encounter (HOSPITAL_COMMUNITY)
Admission: RE | Disposition: A | Discharge status: Home / Self Care | Payer: Self-pay | Source: Home / Self Care | Attending: Internal Medicine

## 2020-10-23 ENCOUNTER — Ambulatory Visit (HOSPITAL_COMMUNITY)
Admission: RE | Admit: 2020-10-23 | Discharge: 2020-10-23 | Disposition: A | Discharge status: Home / Self Care | Payer: Medicare Other | Attending: Internal Medicine | Admitting: Internal Medicine

## 2020-10-23 ENCOUNTER — Encounter (HOSPITAL_COMMUNITY): Payer: Self-pay

## 2020-10-23 DIAGNOSIS — K5730 Diverticulosis of large intestine without perforation or abscess without bleeding: Secondary | ICD-10-CM | POA: Diagnosis not present

## 2020-10-23 DIAGNOSIS — Z1211 Encounter for screening for malignant neoplasm of colon: Secondary | ICD-10-CM | POA: Diagnosis not present

## 2020-10-23 DIAGNOSIS — Z85820 Personal history of malignant melanoma of skin: Secondary | ICD-10-CM | POA: Insufficient documentation

## 2020-10-23 DIAGNOSIS — Z79899 Other long term (current) drug therapy: Secondary | ICD-10-CM | POA: Diagnosis not present

## 2020-10-23 DIAGNOSIS — Z96652 Presence of left artificial knee joint: Secondary | ICD-10-CM | POA: Insufficient documentation

## 2020-10-23 DIAGNOSIS — F32A Depression, unspecified: Secondary | ICD-10-CM | POA: Diagnosis not present

## 2020-10-23 DIAGNOSIS — Z7982 Long term (current) use of aspirin: Secondary | ICD-10-CM | POA: Insufficient documentation

## 2020-10-23 DIAGNOSIS — K648 Other hemorrhoids: Secondary | ICD-10-CM | POA: Insufficient documentation

## 2020-10-23 DIAGNOSIS — Z880 Allergy status to penicillin: Secondary | ICD-10-CM | POA: Insufficient documentation

## 2020-10-23 HISTORY — PX: COLONOSCOPY WITH PROPOFOL: SHX5780

## 2020-10-23 SURGERY — COLONOSCOPY WITH PROPOFOL
Anesthesia: General

## 2020-10-23 MED ORDER — LACTATED RINGERS IV SOLN: INTRAVENOUS | Status: DC | PRN

## 2020-10-23 MED ORDER — PROPOFOL 500 MG/50ML IV EMUL
INTRAVENOUS | Status: DC | PRN
  Administered 2020-10-23: 150 ug/kg/min via INTRAVENOUS

## 2020-10-23 MED ORDER — LACTATED RINGERS IV SOLN: INTRAVENOUS | Status: DC

## 2020-10-23 MED ORDER — PROPOFOL 10 MG/ML IV BOLUS
INTRAVENOUS | Status: DC | PRN
  Administered 2020-10-23: 100 mg via INTRAVENOUS

## 2020-10-23 NOTE — Anesthesia Preprocedure Evaluation (Signed)
Anesthesia Evaluation  Patient identified by MRN, date of birth, ID band Patient awake    Reviewed: Allergy & Precautions, H&P , NPO status , Patient's Chart, lab work & pertinent test results, reviewed documented beta blocker date and time   Airway Mallampati: II  TM Distance: >3 FB Neck ROM: full    Dental no notable dental hx.    Pulmonary neg pulmonary ROS,    Pulmonary exam normal breath sounds clear to auscultation       Cardiovascular Exercise Tolerance: Good hypertension, negative cardio ROS   Rhythm:regular Rate:Normal     Neuro/Psych PSYCHIATRIC DISORDERS Depression negative neurological ROS     GI/Hepatic Neg liver ROS, GERD  Medicated,  Endo/Other  negative endocrine ROSdiabetes, Type 2  Renal/GU negative Renal ROS  negative genitourinary   Musculoskeletal   Abdominal   Peds  Hematology negative hematology ROS (+)   Anesthesia Other Findings   Reproductive/Obstetrics negative OB ROS                             Anesthesia Physical Anesthesia Plan  ASA: 2  Anesthesia Plan: General   Post-op Pain Management:    Induction:   PONV Risk Score and Plan: Propofol infusion  Airway Management Planned:   Additional Equipment:   Intra-op Plan:   Post-operative Plan:   Informed Consent: I have reviewed the patients History and Physical, chart, labs and discussed the procedure including the risks, benefits and alternatives for the proposed anesthesia with the patient or authorized representative who has indicated his/her understanding and acceptance.     Dental Advisory Given  Plan Discussed with: CRNA  Anesthesia Plan Comments:         Anesthesia Quick Evaluation

## 2020-10-23 NOTE — Transfer of Care (Signed)
Immediate Anesthesia Transfer of Care Note  Patient: Gloria Rose  Procedure(s) Performed: COLONOSCOPY WITH PROPOFOL  Patient Location: Endoscopy Unit  Anesthesia Type:General  Level of Consciousness: drowsy  Airway & Oxygen Therapy: Patient Spontanous Breathing  Post-op Assessment: Report given to RN and Post -op Vital signs reviewed and stable  Post vital signs: Reviewed and stable  Last Vitals:  Vitals Value Taken Time  BP    Temp    Pulse    Resp    SpO2      Last Pain:  Vitals:   10/23/20 0943  TempSrc:   PainSc: 0-No pain      Patients Stated Pain Goal: 4 (28/90/22 8406)  Complications: No notable events documented.

## 2020-10-23 NOTE — H&P (Signed)
Primary Care Physician:  Pablo Lawrence, NP Primary Gastroenterologist:  Dr. Abbey Chatters  Pre-Procedure History & Physical: HPI:  Gloria Rose is a 70 y.o. female is here for a colonoscopy for colon cancer screening purposes.  Patient denies any family history of colorectal cancer.  No melena or hematochezia.  No abdominal pain or unintentional weight loss.  No change in bowel habits.  Overall feels well from a GI standpoint.  Past Medical History:  Diagnosis Date   Cancer ([place])    melanoma of toe (big toe on left foot) removed   Depression    Diabetes mellitus without complication ([place])    dx 7-8 yrs  type 2   GERD (gastroesophageal reflux disease)    Hypertension     Past Surgical History:  Procedure Laterality Date   RTR     RIGHT SHOULDER IN 2013   TOTAL KNEE ARTHROPLASTY Left 07/03/2014   Procedure: LEFT TOTAL KNEE ARTHROPLASTY;  Surgeon: Vickey Huger, MD;  Location: [place];  Service: Orthopedics;  Laterality: Left;    Prior to Admission medications   Medication Sig Start Date End Date Taking? Authorizing Provider  amLODipine (NORVASC) 5 MG tablet Take 5 mg by mouth at bedtime.   Yes [provider]  aspirin EC 81 MG tablet Take 81 mg by mouth at bedtime.   Yes [provider]  atenolol (TENORMIN) 100 MG tablet Take 100 mg by mouth at bedtime.   Yes [provider]  hydrochlorothiazide (HYDRODIURIL) 25 MG tablet Take 25 mg by mouth at bedtime.   Yes [provider]  lisinopril (PRINIVIL,ZESTRIL) 20 MG tablet Take 20 mg by mouth at bedtime.   Yes [provider]  omeprazole (PRILOSEC) 20 MG capsule Take 20 mg by mouth at bedtime.   Yes [provider]  PARoxetine (PAXIL) 40 MG tablet Take 40 mg by mouth at bedtime.   Yes [provider]  pravastatin (PRAVACHOL) 40 MG tablet Take 40 mg by mouth at bedtime.   Yes [provider]    Allergies as of 10/17/2020 - Review Complete 10/11/2020  Allergen Reaction  Noted   Penicillins Anaphylaxis and Hives 06/20/2014    Family History  Problem Relation Age of Onset   Cancer Mother        lung cancer   Cancer Sister        lung    Social History   Socioeconomic History   Marital status: Married    Spouse name: Not on file   Number of children: Not on file   Years of education: Not on file   Highest education level: Not on file  Occupational History   Not on file  Tobacco Use   Smoking status: Never   Smokeless tobacco: Never  Vaping Use   Vaping Use: Never used  Substance and Sexual Activity   Alcohol use: Yes    Alcohol/week: 2.0 standard drinks    Types: 2 Glasses of wine per week   Drug use: No   Sexual activity: Yes    Birth control/protection: None, Post-menopausal  Other Topics Concern   Not on file  Social History Narrative   Not on file   Social Determinants of Health   Financial Resource Strain: Not on file  Food Insecurity: Not on file  Transportation Needs: Not on file  Physical Activity: Not on file  Stress: Not on file  Social Connections: Not on file  Intimate Partner Violence: Not on file    Review of Systems:  See HPI, otherwise negative ROS  Physical Exam: Vital signs in last 24 hours: Temp:  [98.2 F (36.8 C)] 98.2 F (36.8 C) (10/18 0853) Pulse Rate:  [68] 68 (10/18 0853) Resp:  [13] 13 (10/18 0853) BP: (144)/(63) 144/63 (10/18 0853) SpO2:  [100 %] 100 % (10/18 0853)   General:   Alert,  Well-developed, well-nourished, pleasant and cooperative in NAD Head:  Normocephalic and atraumatic. Eyes:  Sclera clear, no icterus.   Conjunctiva pink. Ears:  Normal auditory acuity. Nose:  No deformity, discharge,  or lesions. Mouth:  No deformity or lesions, dentition normal. Neck:  Supple; no masses or thyromegaly. Lungs:  Clear throughout to auscultation.   No wheezes, crackles, or rhonchi. No acute distress. Heart:  Regular rate and rhythm; no murmurs, clicks, rubs,  or gallops. Abdomen:  Soft,  nontender and nondistended. No masses, hepatosplenomegaly or hernias noted. Normal bowel sounds, without guarding, and without rebound.   Msk:  Symmetrical without gross deformities. Normal posture. Extremities:  Without clubbing or edema. Neurologic:  Alert and  oriented x4;  grossly normal neurologically. Skin:  Intact without significant lesions or rashes. Cervical Nodes:  No significant cervical adenopathy. Psych:  Alert and cooperative. Normal mood and affect.  Impression/Plan: Gloria Rose is here for a colonoscopy to be performed for colon cancer screening purposes.  The risks of the procedure including infection, bleed, or perforation as well as benefits, limitations, alternatives and imponderables have been reviewed with the patient. Questions have been answered. All parties agreeable.

## 2020-10-23 NOTE — Op Note (Signed)
[facility] Patient Name: Gloria Rose Procedure Date: 10/23/2020 9:36 AM MRN: [id number] Date of Birth: May 03, 1950 Attending MD: Elon Alas. Edgar Frisk CSN: [id number] Age: 70 Admit Type: Outpatient Procedure:                Colonoscopy Indications:              Screening for colorectal malignant neoplasm Providers:                Elon Alas. Abbey Chatters, DO, Jessica Boudreaux, Casimer Bilis, Technician Referring MD:              Medicines:                See the Anesthesia note for documentation of the                            administered medications Complications:            No immediate complications. Estimated Blood Loss:     Estimated blood loss: none. Procedure:                Pre-Anesthesia Assessment:                           - The anesthesia plan was to use monitored                            anesthesia care (MAC).                           After obtaining informed consent, the colonoscope                            was passed under direct vision. Throughout the                            procedure, the patient's blood pressure, pulse, and                            oxygen saturations were monitored continuously. The                            [id number] ([id number]) was introduced through the                            anus and advanced to the the cecum, identified by                            appendiceal orifice and ileocecal valve. The                            colonoscopy was performed without difficulty. The                            patient tolerated the procedure well. The quality  of the bowel preparation was evaluated using the                            BBPS [facility] Bowel Preparation Scale) with scores                            of: Right Colon = 3, Transverse Colon = 3 and Left                            Colon = 3 (entire mucosa seen well with no residual                            staining, small  fragments of stool or opaque                            liquid). The total BBPS score equals 9. Scope In: 9:52:42 AM Scope Out: 10:04:33 AM Scope Withdrawal Time: 0 hours 8 minutes 10 seconds  Total Procedure Duration: 0 hours 11 minutes 51 seconds  Findings:      The perianal and digital rectal examinations were normal.      Non-bleeding internal hemorrhoids were found during endoscopy.      Multiple small and large-mouthed diverticula were found in the sigmoid       colon and descending colon.      The exam was otherwise without abnormality. Impression:               - Non-bleeding internal hemorrhoids.                           - Diverticulosis in the sigmoid colon and in the                            descending colon.                           - The examination was otherwise normal.                           - No specimens collected. Moderate Sedation:      Per Anesthesia Care Recommendation:           - Patient has a contact number available for                            emergencies. The signs and symptoms of potential                            delayed complications were discussed with the                            patient. Return to normal activities tomorrow.                            Written discharge instructions were provided to the  patient.                           - Resume previous diet.                           - Continue present medications.                           - Repeat colonoscopy in 10 years for screening                            purposes.                           - Return to GI clinic PRN. Procedure Code(s):        --- Professional ---                           K9179, Colorectal cancer screening; colonoscopy on                            individual not meeting criteria for high risk Diagnosis Code(s):        --- Professional ---                           Z12.11, Encounter for screening for malignant                             neoplasm of colon                           K64.8, Other hemorrhoids                           K57.30, Diverticulosis of large intestine without                            perforation or abscess without bleeding CPT copyright 2019 American Medical Association. All rights reserved. The codes documented in this report are preliminary and upon coder review may  be revised to meet current compliance requirements. Elon Alas. Abbey Chatters, DO [place] Abbey Chatters, DO 10/23/2020 10:08:44 AM This report has been signed electronically. Number of Addenda: 0

## 2020-10-23 NOTE — Anesthesia Postprocedure Evaluation (Signed)
Anesthesia Post Note  Patient: Gloria Rose  Procedure(s) Performed: COLONOSCOPY WITH PROPOFOL  Patient location during evaluation: Phase II Anesthesia Type: General Level of consciousness: awake Pain management: pain level controlled Vital Signs Assessment: post-procedure vital signs reviewed and stable Respiratory status: spontaneous breathing and respiratory function stable Cardiovascular status: blood pressure returned to baseline and stable Postop Assessment: no headache and no apparent nausea or vomiting Anesthetic complications: no Comments: Late entry   No notable events documented.   Last Vitals:  Vitals:   10/23/20 1013 10/23/20 1016  BP: (!) 81/53 (!) 98/52  Pulse:    Resp:    Temp:    SpO2:      Last Pain:  Vitals:   10/23/20 1014  TempSrc:   PainSc: 0-No pain                 Louann Sjogren

## 2020-10-23 NOTE — Discharge Instructions (Addendum)

## 2020-10-26 ENCOUNTER — Encounter (HOSPITAL_COMMUNITY): Payer: Self-pay | Admitting: Internal Medicine

## 2020-10-26 DIAGNOSIS — M1711 Unilateral primary osteoarthritis, right knee: Secondary | ICD-10-CM | POA: Diagnosis not present

## 2020-10-26 DIAGNOSIS — M21161 Varus deformity, not elsewhere classified, right knee: Secondary | ICD-10-CM | POA: Diagnosis not present

## 2020-11-06 DIAGNOSIS — Z20822 Contact with and (suspected) exposure to covid-19: Secondary | ICD-10-CM | POA: Diagnosis not present

## 2020-12-04 DIAGNOSIS — M1711 Unilateral primary osteoarthritis, right knee: Secondary | ICD-10-CM | POA: Diagnosis not present

## 2020-12-07 ENCOUNTER — Telehealth: Payer: Medicare Other

## 2020-12-19 ENCOUNTER — Ambulatory Visit (HOSPITAL_COMMUNITY): Payer: Medicare Other

## 2020-12-20 ENCOUNTER — Other Ambulatory Visit (HOSPITAL_COMMUNITY): Payer: Medicare Other

## 2020-12-20 ENCOUNTER — Ambulatory Visit (HOSPITAL_COMMUNITY): Payer: Medicare Other

## 2021-01-02 DIAGNOSIS — G8918 Other acute postprocedural pain: Secondary | ICD-10-CM | POA: Diagnosis not present

## 2021-01-02 DIAGNOSIS — M1711 Unilateral primary osteoarthritis, right knee: Secondary | ICD-10-CM | POA: Diagnosis not present

## 2021-01-10 DIAGNOSIS — Z96651 Presence of right artificial knee joint: Secondary | ICD-10-CM | POA: Diagnosis not present

## 2021-01-14 DIAGNOSIS — R2689 Other abnormalities of gait and mobility: Secondary | ICD-10-CM | POA: Diagnosis not present

## 2021-01-14 DIAGNOSIS — M25661 Stiffness of right knee, not elsewhere classified: Secondary | ICD-10-CM | POA: Diagnosis not present

## 2021-01-14 DIAGNOSIS — M25561 Pain in right knee: Secondary | ICD-10-CM | POA: Diagnosis not present

## 2021-01-14 DIAGNOSIS — Z96651 Presence of right artificial knee joint: Secondary | ICD-10-CM | POA: Diagnosis not present

## 2021-01-14 DIAGNOSIS — R531 Weakness: Secondary | ICD-10-CM | POA: Diagnosis not present

## 2021-01-16 DIAGNOSIS — Z96651 Presence of right artificial knee joint: Secondary | ICD-10-CM | POA: Diagnosis not present

## 2021-01-16 DIAGNOSIS — M25561 Pain in right knee: Secondary | ICD-10-CM | POA: Diagnosis not present

## 2021-01-16 DIAGNOSIS — R531 Weakness: Secondary | ICD-10-CM | POA: Diagnosis not present

## 2021-01-16 DIAGNOSIS — M25661 Stiffness of right knee, not elsewhere classified: Secondary | ICD-10-CM | POA: Diagnosis not present

## 2021-01-16 DIAGNOSIS — R2689 Other abnormalities of gait and mobility: Secondary | ICD-10-CM | POA: Diagnosis not present

## 2021-01-21 DIAGNOSIS — R2689 Other abnormalities of gait and mobility: Secondary | ICD-10-CM | POA: Diagnosis not present

## 2021-01-21 DIAGNOSIS — M25661 Stiffness of right knee, not elsewhere classified: Secondary | ICD-10-CM | POA: Diagnosis not present

## 2021-01-21 DIAGNOSIS — M25561 Pain in right knee: Secondary | ICD-10-CM | POA: Diagnosis not present

## 2021-01-21 DIAGNOSIS — Z96651 Presence of right artificial knee joint: Secondary | ICD-10-CM | POA: Diagnosis not present

## 2021-01-21 DIAGNOSIS — R531 Weakness: Secondary | ICD-10-CM | POA: Diagnosis not present

## 2021-01-24 DIAGNOSIS — M25661 Stiffness of right knee, not elsewhere classified: Secondary | ICD-10-CM | POA: Diagnosis not present

## 2021-01-24 DIAGNOSIS — M25561 Pain in right knee: Secondary | ICD-10-CM | POA: Diagnosis not present

## 2021-01-24 DIAGNOSIS — R531 Weakness: Secondary | ICD-10-CM | POA: Diagnosis not present

## 2021-01-24 DIAGNOSIS — Z96651 Presence of right artificial knee joint: Secondary | ICD-10-CM | POA: Diagnosis not present

## 2021-01-24 DIAGNOSIS — R2689 Other abnormalities of gait and mobility: Secondary | ICD-10-CM | POA: Diagnosis not present

## 2021-01-25 DIAGNOSIS — Z96651 Presence of right artificial knee joint: Secondary | ICD-10-CM | POA: Diagnosis not present

## 2021-01-25 DIAGNOSIS — M25661 Stiffness of right knee, not elsewhere classified: Secondary | ICD-10-CM | POA: Diagnosis not present

## 2021-01-25 DIAGNOSIS — M25561 Pain in right knee: Secondary | ICD-10-CM | POA: Diagnosis not present

## 2021-01-25 DIAGNOSIS — R531 Weakness: Secondary | ICD-10-CM | POA: Diagnosis not present

## 2021-01-25 DIAGNOSIS — R2689 Other abnormalities of gait and mobility: Secondary | ICD-10-CM | POA: Diagnosis not present

## 2021-01-30 DIAGNOSIS — R2689 Other abnormalities of gait and mobility: Secondary | ICD-10-CM | POA: Diagnosis not present

## 2021-01-30 DIAGNOSIS — R531 Weakness: Secondary | ICD-10-CM | POA: Diagnosis not present

## 2021-01-30 DIAGNOSIS — M25561 Pain in right knee: Secondary | ICD-10-CM | POA: Diagnosis not present

## 2021-01-30 DIAGNOSIS — Z96651 Presence of right artificial knee joint: Secondary | ICD-10-CM | POA: Diagnosis not present

## 2021-01-30 DIAGNOSIS — M25661 Stiffness of right knee, not elsewhere classified: Secondary | ICD-10-CM | POA: Diagnosis not present

## 2021-01-31 DIAGNOSIS — M25561 Pain in right knee: Secondary | ICD-10-CM | POA: Diagnosis not present

## 2021-01-31 DIAGNOSIS — R2689 Other abnormalities of gait and mobility: Secondary | ICD-10-CM | POA: Diagnosis not present

## 2021-01-31 DIAGNOSIS — Z96651 Presence of right artificial knee joint: Secondary | ICD-10-CM | POA: Diagnosis not present

## 2021-01-31 DIAGNOSIS — M25661 Stiffness of right knee, not elsewhere classified: Secondary | ICD-10-CM | POA: Diagnosis not present

## 2021-01-31 DIAGNOSIS — R531 Weakness: Secondary | ICD-10-CM | POA: Diagnosis not present

## 2021-02-08 DIAGNOSIS — M25661 Stiffness of right knee, not elsewhere classified: Secondary | ICD-10-CM | POA: Diagnosis not present

## 2021-02-08 DIAGNOSIS — R2689 Other abnormalities of gait and mobility: Secondary | ICD-10-CM | POA: Diagnosis not present

## 2021-02-08 DIAGNOSIS — R531 Weakness: Secondary | ICD-10-CM | POA: Diagnosis not present

## 2021-02-08 DIAGNOSIS — Z96651 Presence of right artificial knee joint: Secondary | ICD-10-CM | POA: Diagnosis not present

## 2021-02-08 DIAGNOSIS — M25561 Pain in right knee: Secondary | ICD-10-CM | POA: Diagnosis not present

## 2021-02-11 DIAGNOSIS — M25561 Pain in right knee: Secondary | ICD-10-CM | POA: Diagnosis not present

## 2021-02-11 DIAGNOSIS — R531 Weakness: Secondary | ICD-10-CM | POA: Diagnosis not present

## 2021-02-11 DIAGNOSIS — M25661 Stiffness of right knee, not elsewhere classified: Secondary | ICD-10-CM | POA: Diagnosis not present

## 2021-02-11 DIAGNOSIS — R2689 Other abnormalities of gait and mobility: Secondary | ICD-10-CM | POA: Diagnosis not present

## 2021-02-11 DIAGNOSIS — Z96651 Presence of right artificial knee joint: Secondary | ICD-10-CM | POA: Diagnosis not present

## 2021-02-18 DIAGNOSIS — Z20822 Contact with and (suspected) exposure to covid-19: Secondary | ICD-10-CM | POA: Diagnosis not present

## 2021-02-26 DIAGNOSIS — Z96651 Presence of right artificial knee joint: Secondary | ICD-10-CM | POA: Diagnosis not present

## 2021-02-26 DIAGNOSIS — M25561 Pain in right knee: Secondary | ICD-10-CM | POA: Diagnosis not present

## 2021-02-26 DIAGNOSIS — R531 Weakness: Secondary | ICD-10-CM | POA: Diagnosis not present

## 2021-02-26 DIAGNOSIS — M25661 Stiffness of right knee, not elsewhere classified: Secondary | ICD-10-CM | POA: Diagnosis not present

## 2021-02-26 DIAGNOSIS — R2689 Other abnormalities of gait and mobility: Secondary | ICD-10-CM | POA: Diagnosis not present

## 2021-03-05 DIAGNOSIS — M25661 Stiffness of right knee, not elsewhere classified: Secondary | ICD-10-CM | POA: Diagnosis not present

## 2021-03-05 DIAGNOSIS — M25561 Pain in right knee: Secondary | ICD-10-CM | POA: Diagnosis not present

## 2021-03-05 DIAGNOSIS — Z96651 Presence of right artificial knee joint: Secondary | ICD-10-CM | POA: Diagnosis not present

## 2021-03-05 DIAGNOSIS — R2689 Other abnormalities of gait and mobility: Secondary | ICD-10-CM | POA: Diagnosis not present

## 2021-03-05 DIAGNOSIS — R531 Weakness: Secondary | ICD-10-CM | POA: Diagnosis not present

## 2021-03-07 DIAGNOSIS — R2689 Other abnormalities of gait and mobility: Secondary | ICD-10-CM | POA: Diagnosis not present

## 2021-03-07 DIAGNOSIS — Z96651 Presence of right artificial knee joint: Secondary | ICD-10-CM | POA: Diagnosis not present

## 2021-03-07 DIAGNOSIS — R531 Weakness: Secondary | ICD-10-CM | POA: Diagnosis not present

## 2021-03-07 DIAGNOSIS — M25661 Stiffness of right knee, not elsewhere classified: Secondary | ICD-10-CM | POA: Diagnosis not present

## 2021-03-07 DIAGNOSIS — M25561 Pain in right knee: Secondary | ICD-10-CM | POA: Diagnosis not present

## 2021-03-12 DIAGNOSIS — Z96651 Presence of right artificial knee joint: Secondary | ICD-10-CM | POA: Diagnosis not present

## 2021-03-12 DIAGNOSIS — R2689 Other abnormalities of gait and mobility: Secondary | ICD-10-CM | POA: Diagnosis not present

## 2021-03-12 DIAGNOSIS — M25561 Pain in right knee: Secondary | ICD-10-CM | POA: Diagnosis not present

## 2021-03-12 DIAGNOSIS — R531 Weakness: Secondary | ICD-10-CM | POA: Diagnosis not present

## 2021-03-12 DIAGNOSIS — M25661 Stiffness of right knee, not elsewhere classified: Secondary | ICD-10-CM | POA: Diagnosis not present

## 2021-03-14 DIAGNOSIS — R531 Weakness: Secondary | ICD-10-CM | POA: Diagnosis not present

## 2021-03-14 DIAGNOSIS — R2689 Other abnormalities of gait and mobility: Secondary | ICD-10-CM | POA: Diagnosis not present

## 2021-03-14 DIAGNOSIS — Z96651 Presence of right artificial knee joint: Secondary | ICD-10-CM | POA: Diagnosis not present

## 2021-03-14 DIAGNOSIS — M25661 Stiffness of right knee, not elsewhere classified: Secondary | ICD-10-CM | POA: Diagnosis not present

## 2021-03-14 DIAGNOSIS — M25561 Pain in right knee: Secondary | ICD-10-CM | POA: Diagnosis not present

## 2021-05-29 ENCOUNTER — Other Ambulatory Visit (HOSPITAL_COMMUNITY): Payer: Self-pay | Admitting: Nurse Practitioner

## 2021-05-29 DIAGNOSIS — D519 Vitamin B12 deficiency anemia, unspecified: Secondary | ICD-10-CM | POA: Diagnosis not present

## 2021-05-29 DIAGNOSIS — E538 Deficiency of other specified B group vitamins: Secondary | ICD-10-CM | POA: Diagnosis not present

## 2021-05-29 DIAGNOSIS — I10 Essential (primary) hypertension: Secondary | ICD-10-CM | POA: Diagnosis not present

## 2021-05-29 DIAGNOSIS — L309 Dermatitis, unspecified: Secondary | ICD-10-CM | POA: Diagnosis not present

## 2021-05-29 DIAGNOSIS — E119 Type 2 diabetes mellitus without complications: Secondary | ICD-10-CM | POA: Diagnosis not present

## 2021-05-29 DIAGNOSIS — K219 Gastro-esophageal reflux disease without esophagitis: Secondary | ICD-10-CM | POA: Diagnosis not present

## 2021-05-29 DIAGNOSIS — Z0189 Encounter for other specified special examinations: Secondary | ICD-10-CM | POA: Diagnosis not present

## 2021-05-29 DIAGNOSIS — E782 Mixed hyperlipidemia: Secondary | ICD-10-CM | POA: Diagnosis not present

## 2021-05-29 DIAGNOSIS — R5383 Other fatigue: Secondary | ICD-10-CM | POA: Diagnosis not present

## 2021-05-29 DIAGNOSIS — Z1239 Encounter for other screening for malignant neoplasm of breast: Secondary | ICD-10-CM | POA: Diagnosis not present

## 2021-05-29 DIAGNOSIS — Z13820 Encounter for screening for osteoporosis: Secondary | ICD-10-CM | POA: Diagnosis not present

## 2021-05-29 DIAGNOSIS — Z1231 Encounter for screening mammogram for malignant neoplasm of breast: Secondary | ICD-10-CM

## 2021-05-29 DIAGNOSIS — F418 Other specified anxiety disorders: Secondary | ICD-10-CM | POA: Diagnosis not present

## 2021-05-29 DIAGNOSIS — E559 Vitamin D deficiency, unspecified: Secondary | ICD-10-CM | POA: Diagnosis not present

## 2021-06-07 ENCOUNTER — Ambulatory Visit (HOSPITAL_COMMUNITY)
Admission: RE | Admit: 2021-06-07 | Discharge: 2021-06-07 | Disposition: A | Discharge status: Home / Self Care | Payer: Medicare Other | Source: Ambulatory Visit | Attending: Nurse Practitioner | Admitting: Nurse Practitioner

## 2021-06-07 DIAGNOSIS — M8588 Other specified disorders of bone density and structure, other site: Secondary | ICD-10-CM | POA: Insufficient documentation

## 2021-06-07 DIAGNOSIS — Z13820 Encounter for screening for osteoporosis: Secondary | ICD-10-CM | POA: Insufficient documentation

## 2021-06-07 DIAGNOSIS — Z780 Asymptomatic menopausal state: Secondary | ICD-10-CM | POA: Diagnosis not present

## 2021-06-07 DIAGNOSIS — Z1231 Encounter for screening mammogram for malignant neoplasm of breast: Secondary | ICD-10-CM | POA: Insufficient documentation

## 2021-06-07 DIAGNOSIS — M85852 Other specified disorders of bone density and structure, left thigh: Secondary | ICD-10-CM | POA: Diagnosis not present

## 2021-06-18 DIAGNOSIS — Z0001 Encounter for general adult medical examination with abnormal findings: Secondary | ICD-10-CM | POA: Diagnosis not present

## 2021-06-18 DIAGNOSIS — F418 Other specified anxiety disorders: Secondary | ICD-10-CM | POA: Diagnosis not present

## 2021-06-18 DIAGNOSIS — K219 Gastro-esophageal reflux disease without esophagitis: Secondary | ICD-10-CM | POA: Diagnosis not present

## 2021-06-18 DIAGNOSIS — E538 Deficiency of other specified B group vitamins: Secondary | ICD-10-CM | POA: Diagnosis not present

## 2021-06-18 DIAGNOSIS — M85852 Other specified disorders of bone density and structure, left thigh: Secondary | ICD-10-CM | POA: Diagnosis not present

## 2021-06-18 DIAGNOSIS — E119 Type 2 diabetes mellitus without complications: Secondary | ICD-10-CM | POA: Diagnosis not present

## 2021-06-18 DIAGNOSIS — I10 Essential (primary) hypertension: Secondary | ICD-10-CM | POA: Diagnosis not present

## 2021-06-18 DIAGNOSIS — E559 Vitamin D deficiency, unspecified: Secondary | ICD-10-CM | POA: Diagnosis not present

## 2021-06-18 DIAGNOSIS — L309 Dermatitis, unspecified: Secondary | ICD-10-CM | POA: Diagnosis not present

## 2021-06-18 DIAGNOSIS — E782 Mixed hyperlipidemia: Secondary | ICD-10-CM | POA: Diagnosis not present

## 2021-06-18 DIAGNOSIS — R748 Abnormal levels of other serum enzymes: Secondary | ICD-10-CM | POA: Diagnosis not present

## 2021-06-18 DIAGNOSIS — R5383 Other fatigue: Secondary | ICD-10-CM | POA: Diagnosis not present

## 2021-08-03 DIAGNOSIS — R202 Paresthesia of skin: Secondary | ICD-10-CM | POA: Diagnosis not present

## 2021-08-03 DIAGNOSIS — F418 Other specified anxiety disorders: Secondary | ICD-10-CM | POA: Diagnosis not present

## 2021-08-03 DIAGNOSIS — M5432 Sciatica, left side: Secondary | ICD-10-CM | POA: Diagnosis not present

## 2021-10-08 DIAGNOSIS — L0291 Cutaneous abscess, unspecified: Secondary | ICD-10-CM | POA: Diagnosis not present

## 2021-10-08 DIAGNOSIS — Z23 Encounter for immunization: Secondary | ICD-10-CM | POA: Diagnosis not present

## 2021-11-27 DIAGNOSIS — S46911A Strain of unspecified muscle, fascia and tendon at shoulder and upper arm level, right arm, initial encounter: Secondary | ICD-10-CM | POA: Diagnosis not present

## 2021-11-27 DIAGNOSIS — L853 Xerosis cutis: Secondary | ICD-10-CM | POA: Diagnosis not present

## 2022-01-31 DIAGNOSIS — I10 Essential (primary) hypertension: Secondary | ICD-10-CM | POA: Diagnosis not present

## 2022-01-31 DIAGNOSIS — E119 Type 2 diabetes mellitus without complications: Secondary | ICD-10-CM | POA: Diagnosis not present

## 2022-01-31 DIAGNOSIS — E559 Vitamin D deficiency, unspecified: Secondary | ICD-10-CM | POA: Diagnosis not present

## 2022-02-04 DIAGNOSIS — E538 Deficiency of other specified B group vitamins: Secondary | ICD-10-CM | POA: Diagnosis not present

## 2022-02-04 DIAGNOSIS — K219 Gastro-esophageal reflux disease without esophagitis: Secondary | ICD-10-CM | POA: Diagnosis not present

## 2022-02-04 DIAGNOSIS — E119 Type 2 diabetes mellitus without complications: Secondary | ICD-10-CM | POA: Diagnosis not present

## 2022-02-04 DIAGNOSIS — F418 Other specified anxiety disorders: Secondary | ICD-10-CM | POA: Diagnosis not present

## 2022-02-04 DIAGNOSIS — R748 Abnormal levels of other serum enzymes: Secondary | ICD-10-CM | POA: Diagnosis not present

## 2022-02-04 DIAGNOSIS — E782 Mixed hyperlipidemia: Secondary | ICD-10-CM | POA: Diagnosis not present

## 2022-02-04 DIAGNOSIS — M85852 Other specified disorders of bone density and structure, left thigh: Secondary | ICD-10-CM | POA: Diagnosis not present

## 2022-02-04 DIAGNOSIS — R5383 Other fatigue: Secondary | ICD-10-CM | POA: Diagnosis not present

## 2022-02-04 DIAGNOSIS — I10 Essential (primary) hypertension: Secondary | ICD-10-CM | POA: Diagnosis not present

## 2022-02-04 DIAGNOSIS — D509 Iron deficiency anemia, unspecified: Secondary | ICD-10-CM | POA: Diagnosis not present

## 2022-02-04 DIAGNOSIS — L309 Dermatitis, unspecified: Secondary | ICD-10-CM | POA: Diagnosis not present

## 2022-02-04 DIAGNOSIS — E559 Vitamin D deficiency, unspecified: Secondary | ICD-10-CM | POA: Diagnosis not present

## 2022-03-06 ENCOUNTER — Encounter: Payer: Self-pay | Admitting: Radiology

## 2022-04-17 DIAGNOSIS — L255 Unspecified contact dermatitis due to plants, except food: Secondary | ICD-10-CM | POA: Diagnosis not present

## 2022-06-12 DIAGNOSIS — Z0100 Encounter for examination of eyes and vision without abnormal findings: Secondary | ICD-10-CM | POA: Diagnosis not present

## 2022-09-04 ENCOUNTER — Other Ambulatory Visit: Payer: Self-pay

## 2022-09-04 ENCOUNTER — Emergency Department (HOSPITAL_COMMUNITY)
Admission: EM | Admit: 2022-09-04 | Discharge: 2022-09-05 | Disposition: A | Discharge status: Home / Self Care | Payer: Medicare HMO | Attending: Emergency Medicine | Admitting: Emergency Medicine

## 2022-09-04 ENCOUNTER — Emergency Department (HOSPITAL_COMMUNITY): Payer: Medicare HMO

## 2022-09-04 ENCOUNTER — Encounter (HOSPITAL_COMMUNITY): Payer: Self-pay | Admitting: *Deleted

## 2022-09-04 DIAGNOSIS — Y92096 Garden or yard of other non-institutional residence as the place of occurrence of the external cause: Secondary | ICD-10-CM | POA: Insufficient documentation

## 2022-09-04 DIAGNOSIS — W19XXXA Unspecified fall, initial encounter: Secondary | ICD-10-CM

## 2022-09-04 DIAGNOSIS — S0101XA Laceration without foreign body of scalp, initial encounter: Secondary | ICD-10-CM | POA: Insufficient documentation

## 2022-09-04 DIAGNOSIS — Z7982 Long term (current) use of aspirin: Secondary | ICD-10-CM | POA: Insufficient documentation

## 2022-09-04 DIAGNOSIS — W01198A Fall on same level from slipping, tripping and stumbling with subsequent striking against other object, initial encounter: Secondary | ICD-10-CM | POA: Insufficient documentation

## 2022-09-04 DIAGNOSIS — I10 Essential (primary) hypertension: Secondary | ICD-10-CM | POA: Diagnosis not present

## 2022-09-04 DIAGNOSIS — R519 Headache, unspecified: Secondary | ICD-10-CM | POA: Diagnosis present

## 2022-09-04 DIAGNOSIS — S069X9A Unspecified intracranial injury with loss of consciousness of unspecified duration, initial encounter: Secondary | ICD-10-CM | POA: Diagnosis not present

## 2022-09-04 LAB — CBC
HCT: 41.3 % (ref 36.0–46.0)
Hemoglobin: 13.2 g/dL (ref 12.0–15.0)
MCH: 27.2 pg (ref 26.0–34.0)
MCHC: 32 g/dL (ref 30.0–36.0)
MCV: 85.2 fL (ref 80.0–100.0)
Platelets: 245 10*3/uL (ref 150–400)
RBC: 4.85 MIL/uL (ref 3.87–5.11)
RDW: 13.2 % (ref 11.5–15.5)
WBC: 8.5 10*3/uL (ref 4.0–10.5)
nRBC: 0 % (ref 0.0–0.2)

## 2022-09-04 LAB — BASIC METABOLIC PANEL
Anion gap: 6 (ref 5–15)
BUN: 22 mg/dL (ref 8–23)
CO2: 29 mmol/L (ref 22–32)
Calcium: 9.3 mg/dL (ref 8.9–10.3)
Chloride: 101 mmol/L (ref 98–111)
Creatinine, Ser: 0.95 mg/dL (ref 0.44–1.00)
GFR, Estimated: >60 mL/min (ref >60)
Glucose, Bld: 163 mg/dL — ABNORMAL HIGH (ref 70–99)
Potassium: 4.4 mmol/L (ref 3.5–5.1)
Sodium: 136 mmol/L (ref 135–145)

## 2022-09-04 NOTE — ED Triage Notes (Addendum)
Pt was cleaning up outside and remembers her husband picking her up off the ground. Pt denies feeling bad prior to syncopal event. Skin tear noted to L elbow, c/o posterior head pain, denied neck pain. A&Ox4, GCS 15. Denies taking blood thinners. Abrasion/dried blood noted to posterior head

## 2022-09-05 ENCOUNTER — Emergency Department (HOSPITAL_COMMUNITY): Payer: Medicare HMO

## 2022-09-05 LAB — MAGNESIUM: Magnesium: 1.7 mg/dL (ref 1.7–2.4)

## 2022-09-05 MED ORDER — ACETAMINOPHEN 500 MG PO TABS
1000.0000 mg | ORAL_TABLET | Freq: Once | ORAL | Status: AC
  Administered 2022-09-05: 1000 mg via ORAL
  Filled 2022-09-05: qty 2

## 2022-09-05 NOTE — ED Notes (Signed)
Patient called , No answer Registration states pt left

## 2022-09-05 NOTE — ED Provider Notes (Signed)
[place] EMERGENCY DEPARTMENT AT [facility] Provider Note   CSN: [id number] Arrival date & time: 09/04/22  1912     History  Chief Complaint  Patient presents with   Loss of Consciousness   Fall    Gloria Rose is a 72 y.o. female.  72 year old female who presents ER today after a fall.  Initial triage note and chief complaint stated loss of consciousness.  Patient states that she does not actually think she lost consciousness.  She states that she remembers falling she states that she was backing up from something in the yard and tripped over a toolbox falling backwards and hitting her head.  She states there was maybe 1 or 2 seconds afterwards that she does not quite remember but ultimately she remembers her husband coming to back in the house and having a headache and bleeding from her head so she presents here for further evaluation.  No injuries elsewhere.  Tetanus up-to-date.   Loss of Consciousness Fall       Home Medications Prior to Admission medications   Medication Sig Start Date End Date Taking? Authorizing Provider  amLODipine (NORVASC) 5 MG tablet Take 5 mg by mouth at bedtime.    [provider]  aspirin EC 81 MG tablet Take 81 mg by mouth at bedtime.    [provider]  atenolol (TENORMIN) 100 MG tablet Take 100 mg by mouth at bedtime.    [provider]  hydrochlorothiazide (HYDRODIURIL) 25 MG tablet Take 25 mg by mouth at bedtime.    [provider]  lisinopril (PRINIVIL,ZESTRIL) 20 MG tablet Take 20 mg by mouth at bedtime.    [provider]  omeprazole (PRILOSEC) 20 MG capsule Take 20 mg by mouth at bedtime.    [provider]  PARoxetine (PAXIL) 40 MG tablet Take 40 mg by mouth at bedtime.    [provider]  pravastatin (PRAVACHOL) 40 MG tablet Take 40 mg by mouth at bedtime.    [provider]      Allergies    Penicillins    Review of Systems   Review of Systems   Cardiovascular:  Positive for syncope.    Physical Exam Updated Vital Signs BP 119/67 (BP Location: Right Arm)   Pulse (!) 53   Temp 97.6 F (36.4 C) (Oral)   Resp 18   Ht 5\' 6"  (1.676 m)   Wt 79.4 kg   SpO2 100%   BMI 28.25 kg/m  Physical Exam Vitals and nursing note reviewed.  Constitutional:      Appearance: She is well-developed.  HENT:     Head: Normocephalic and atraumatic.     Comments: Small abrasion to the top of the scalp and then a small laceration to the posterior scalp, mostly hemostatic. Eyes:     Pupils: Pupils are equal, round, and reactive to light.  Cardiovascular:     Rate and Rhythm: Normal rate and regular rhythm.  Pulmonary:     Effort: No respiratory distress.     Breath sounds: No stridor.  Abdominal:     General: There is no distension.  Musculoskeletal:     Cervical back: Normal range of motion.  Neurological:     Mental Status: She is alert.     Comments: No altered mental status, able to give full seemingly accurate history.  Face is symmetric, EOM's intact, pupils equal and reactive, vision intact, tongue and uvula midline without deviation. Upper and Lower extremity motor  5/5, intact pain perception in distal extremities, 2+ reflexes in biceps, patella and achilles tendons. Able to perform finger to nose normal with both hands. Walks without assistance or evident ataxia.       ED Results / Procedures / Treatments   Labs (all labs ordered are listed, but only abnormal results are displayed) Labs Reviewed  BASIC METABOLIC PANEL - Abnormal; Notable for the following components:      Result Value   Glucose, Bld 163 (*)    All other components within normal limits  CBC  MAGNESIUM  CBG MONITORING, ED    EKG EKG Interpretation Date/Time:  Thursday September 04 2022 19:28:59 EDT Ventricular Rate:  67 PR Interval:  178 QRS Duration:  86 QT Interval:  406 QTC Calculation: 429 R Axis:   -6  Text Interpretation: Normal sinus rhythm  Normal ECG When compared with ECG of 03-Jul-2014 07:08, No significant change was found Confirmed by Marily Memos [phone]) on 09/05/2022 1:06:32 AM  Radiology CT Head Wo Contrast  Result Date: 09/04/2022 CLINICAL DATA:  Head trauma, loss of consciousness.  Fall. EXAM: CT HEAD WITHOUT CONTRAST TECHNIQUE: Contiguous axial images were obtained from the base of the skull through the vertex without intravenous contrast. RADIATION DOSE REDUCTION: This exam was performed according to the departmental dose-optimization program which includes automated exposure control, adjustment of the mA and/or kV according to patient size and/or use of iterative reconstruction technique. COMPARISON:  None Available. FINDINGS: Brain: No acute intracranial hemorrhage, midline shift or mass effect. No extra-axial fluid collection. Mild periventricular white matter hypodensities are present bilaterally. No hydrocephalus. Vascular: No hyperdense vessel or unexpected calcification. Skull: Normal. Negative for fracture or focal lesion. Sinuses/Orbits: No acute finding. Other: Increased density is noted in the scalp over the parietal bone on the left, suggesting contusion. IMPRESSION: No acute intracranial process. Electronically Signed   By: Thornell Sartorius M.D.   On: 09/04/2022 20:19    Procedures Procedures    Medications Ordered in ED Medications  acetaminophen (TYLENOL) tablet 1,000 mg (1,000 mg Oral Given 09/05/22 0203)    ED Course/ Medical Decision Making/ A&P                                 Medical Decision Making Amount and/or Complexity of Data Reviewed Labs: ordered.  Risk OTC drugs.   Wound clean and dermabonded.  No evidence of intracranial injury on my interpretation CT scan.  Neuroexam intact.  Ambulates without difficulty.  Headache improved with Tylenol.  Discussed with her the signs and symptoms of delayed head bleed and also signs and symptoms of concussion.  I discussed that for the delayed head bleed  she would need to return here concussion type symptoms she could do incremental return to activity at home with PCP follow-up.  Final Clinical Impression(s) / ED Diagnoses Final diagnoses:  Fall, initial encounter  Laceration of scalp, initial encounter    Rx / DC Orders ED Discharge Orders     None         Sandhya Denherder, Barbara Cower, MD 09/05/22 [phone]

## 2022-09-11 ENCOUNTER — Other Ambulatory Visit: Payer: Self-pay

## 2022-09-11 ENCOUNTER — Emergency Department (HOSPITAL_COMMUNITY): Payer: Medicare HMO

## 2022-09-11 ENCOUNTER — Encounter (HOSPITAL_COMMUNITY): Payer: Self-pay | Admitting: Emergency Medicine

## 2022-09-11 ENCOUNTER — Inpatient Hospital Stay (HOSPITAL_COMMUNITY)
Admission: EM | Admit: 2022-09-11 | Discharge: 2022-09-16 | DRG: 853 | Disposition: A | Discharge status: Home / Self Care | Payer: Medicare HMO | Attending: Family Medicine | Admitting: Family Medicine

## 2022-09-11 DIAGNOSIS — Z79899 Other long term (current) drug therapy: Secondary | ICD-10-CM | POA: Diagnosis not present

## 2022-09-11 DIAGNOSIS — R7989 Other specified abnormal findings of blood chemistry: Secondary | ICD-10-CM | POA: Diagnosis present

## 2022-09-11 DIAGNOSIS — K7689 Other specified diseases of liver: Secondary | ICD-10-CM | POA: Diagnosis not present

## 2022-09-11 DIAGNOSIS — Z452 Encounter for adjustment and management of vascular access device: Secondary | ICD-10-CM | POA: Diagnosis not present

## 2022-09-11 DIAGNOSIS — R1013 Epigastric pain: Secondary | ICD-10-CM | POA: Diagnosis not present

## 2022-09-11 DIAGNOSIS — Z4682 Encounter for fitting and adjustment of non-vascular catheter: Secondary | ICD-10-CM | POA: Diagnosis not present

## 2022-09-11 DIAGNOSIS — R7401 Elevation of levels of liver transaminase levels: Secondary | ICD-10-CM | POA: Diagnosis present

## 2022-09-11 DIAGNOSIS — K8000 Calculus of gallbladder with acute cholecystitis without obstruction: Secondary | ICD-10-CM | POA: Diagnosis present

## 2022-09-11 DIAGNOSIS — G4700 Insomnia, unspecified: Secondary | ICD-10-CM | POA: Diagnosis present

## 2022-09-11 DIAGNOSIS — A419 Sepsis, unspecified organism: Secondary | ICD-10-CM | POA: Diagnosis not present

## 2022-09-11 DIAGNOSIS — N179 Acute kidney failure, unspecified: Secondary | ICD-10-CM | POA: Diagnosis present

## 2022-09-11 DIAGNOSIS — Z1152 Encounter for screening for COVID-19: Secondary | ICD-10-CM | POA: Diagnosis not present

## 2022-09-11 DIAGNOSIS — F32A Depression, unspecified: Secondary | ICD-10-CM | POA: Diagnosis present

## 2022-09-11 DIAGNOSIS — R601 Generalized edema: Secondary | ICD-10-CM | POA: Diagnosis not present

## 2022-09-11 DIAGNOSIS — K828 Other specified diseases of gallbladder: Secondary | ICD-10-CM | POA: Diagnosis not present

## 2022-09-11 DIAGNOSIS — K810 Acute cholecystitis: Secondary | ICD-10-CM | POA: Diagnosis not present

## 2022-09-11 DIAGNOSIS — K660 Peritoneal adhesions (postprocedural) (postinfection): Secondary | ICD-10-CM | POA: Diagnosis present

## 2022-09-11 DIAGNOSIS — Z801 Family history of malignant neoplasm of trachea, bronchus and lung: Secondary | ICD-10-CM | POA: Diagnosis not present

## 2022-09-11 DIAGNOSIS — B957 Other staphylococcus as the cause of diseases classified elsewhere: Secondary | ICD-10-CM | POA: Diagnosis present

## 2022-09-11 DIAGNOSIS — E1165 Type 2 diabetes mellitus with hyperglycemia: Secondary | ICD-10-CM | POA: Diagnosis present

## 2022-09-11 DIAGNOSIS — R109 Unspecified abdominal pain: Secondary | ICD-10-CM | POA: Diagnosis present

## 2022-09-11 DIAGNOSIS — K812 Acute cholecystitis with chronic cholecystitis: Secondary | ICD-10-CM | POA: Diagnosis not present

## 2022-09-11 DIAGNOSIS — Z85820 Personal history of malignant melanoma of skin: Secondary | ICD-10-CM | POA: Diagnosis not present

## 2022-09-11 DIAGNOSIS — I10 Essential (primary) hypertension: Secondary | ICD-10-CM | POA: Diagnosis present

## 2022-09-11 DIAGNOSIS — K219 Gastro-esophageal reflux disease without esophagitis: Secondary | ICD-10-CM | POA: Diagnosis present

## 2022-09-11 DIAGNOSIS — Z880 Allergy status to penicillin: Secondary | ICD-10-CM

## 2022-09-11 DIAGNOSIS — E782 Mixed hyperlipidemia: Secondary | ICD-10-CM | POA: Diagnosis present

## 2022-09-11 DIAGNOSIS — R110 Nausea: Secondary | ICD-10-CM | POA: Diagnosis not present

## 2022-09-11 DIAGNOSIS — Z96652 Presence of left artificial knee joint: Secondary | ICD-10-CM | POA: Diagnosis present

## 2022-09-11 DIAGNOSIS — R918 Other nonspecific abnormal finding of lung field: Secondary | ICD-10-CM | POA: Diagnosis not present

## 2022-09-11 DIAGNOSIS — A4151 Sepsis due to Escherichia coli [E. coli]: Principal | ICD-10-CM | POA: Diagnosis present

## 2022-09-11 DIAGNOSIS — E871 Hypo-osmolality and hyponatremia: Secondary | ICD-10-CM | POA: Diagnosis present

## 2022-09-11 DIAGNOSIS — E872 Acidosis, unspecified: Secondary | ICD-10-CM | POA: Diagnosis present

## 2022-09-11 DIAGNOSIS — Z7982 Long term (current) use of aspirin: Secondary | ICD-10-CM

## 2022-09-11 DIAGNOSIS — R1010 Upper abdominal pain, unspecified: Secondary | ICD-10-CM | POA: Diagnosis not present

## 2022-09-11 DIAGNOSIS — R1084 Generalized abdominal pain: Secondary | ICD-10-CM | POA: Diagnosis not present

## 2022-09-11 DIAGNOSIS — R6521 Severe sepsis with septic shock: Secondary | ICD-10-CM | POA: Diagnosis present

## 2022-09-11 DIAGNOSIS — K8012 Calculus of gallbladder with acute and chronic cholecystitis without obstruction: Secondary | ICD-10-CM | POA: Diagnosis present

## 2022-09-11 DIAGNOSIS — E876 Hypokalemia: Secondary | ICD-10-CM | POA: Diagnosis present

## 2022-09-11 DIAGNOSIS — R1110 Vomiting, unspecified: Secondary | ICD-10-CM | POA: Diagnosis not present

## 2022-09-11 DIAGNOSIS — R739 Hyperglycemia, unspecified: Secondary | ICD-10-CM | POA: Insufficient documentation

## 2022-09-11 DIAGNOSIS — E1159 Type 2 diabetes mellitus with other circulatory complications: Secondary | ICD-10-CM | POA: Insufficient documentation

## 2022-09-11 DIAGNOSIS — R112 Nausea with vomiting, unspecified: Secondary | ICD-10-CM | POA: Insufficient documentation

## 2022-09-11 DIAGNOSIS — K8020 Calculus of gallbladder without cholecystitis without obstruction: Secondary | ICD-10-CM | POA: Diagnosis not present

## 2022-09-11 LAB — COMPREHENSIVE METABOLIC PANEL
ALT: 59 U/L — ABNORMAL HIGH (ref 0–44)
AST: 105 U/L — ABNORMAL HIGH (ref 15–41)
Albumin: 3.7 g/dL (ref 3.5–5.0)
Alkaline Phosphatase: 104 U/L (ref 38–126)
Anion gap: 13 (ref 5–15)
BUN: 19 mg/dL (ref 8–23)
CO2: 24 mmol/L (ref 22–32)
Calcium: 8.8 mg/dL — ABNORMAL LOW (ref 8.9–10.3)
Chloride: 100 mmol/L (ref 98–111)
Creatinine, Ser: 0.9 mg/dL (ref 0.44–1.00)
GFR, Estimated: >60 mL/min (ref >60)
Glucose, Bld: 175 mg/dL — ABNORMAL HIGH (ref 70–99)
Potassium: 3.4 mmol/L — ABNORMAL LOW (ref 3.5–5.1)
Sodium: 137 mmol/L (ref 135–145)
Total Bilirubin: 2.1 mg/dL — ABNORMAL HIGH (ref 0.3–1.2)
Total Protein: 6.7 g/dL (ref 6.5–8.1)

## 2022-09-11 LAB — TROPONIN I (HIGH SENSITIVITY)
Troponin I (High Sensitivity): 4 ng/L (ref <18)
Troponin I (High Sensitivity): 5 ng/L (ref <18)

## 2022-09-11 LAB — LACTIC ACID, PLASMA
Lactic Acid, Venous: 2.7 mmol/L (ref 0.5–1.9)
Lactic Acid, Venous: 4.3 mmol/L (ref 0.5–1.9)
Lactic Acid, Venous: 4.3 mmol/L (ref 0.5–1.9)

## 2022-09-11 LAB — CBC
HCT: 39.5 % (ref 36.0–46.0)
Hemoglobin: 12.9 g/dL (ref 12.0–15.0)
MCH: 27.4 pg (ref 26.0–34.0)
MCHC: 32.7 g/dL (ref 30.0–36.0)
MCV: 83.9 fL (ref 80.0–100.0)
Platelets: 231 10*3/uL (ref 150–400)
RBC: 4.71 MIL/uL (ref 3.87–5.11)
RDW: 13.4 % (ref 11.5–15.5)
WBC: 10.7 10*3/uL — ABNORMAL HIGH (ref 4.0–10.5)
nRBC: 0 % (ref 0.0–0.2)

## 2022-09-11 LAB — URINALYSIS, ROUTINE W REFLEX MICROSCOPIC
Bacteria, UA: NONE SEEN
Bilirubin Urine: NEGATIVE
Glucose, UA: NEGATIVE mg/dL
Ketones, ur: NEGATIVE mg/dL
Nitrite: NEGATIVE
Protein, ur: NEGATIVE mg/dL
Specific Gravity, Urine: 1.038 — ABNORMAL HIGH (ref 1.005–1.030)
pH: 5 (ref 5.0–8.0)

## 2022-09-11 LAB — LIPASE, BLOOD: Lipase: 35 U/L (ref 11–51)

## 2022-09-11 LAB — SARS CORONAVIRUS 2 BY RT PCR: SARS Coronavirus 2 by RT PCR: NEGATIVE

## 2022-09-11 MED ORDER — LACTATED RINGERS IV BOLUS
1000.0000 mL | Freq: Once | INTRAVENOUS | Status: AC
  Administered 2022-09-11: 1000 mL via INTRAVENOUS

## 2022-09-11 MED ORDER — IOHEXOL 300 MG/ML  SOLN
100.0000 mL | Freq: Once | INTRAMUSCULAR | Status: AC | PRN
  Administered 2022-09-11: 100 mL via INTRAVENOUS

## 2022-09-11 MED ORDER — NOREPINEPHRINE 4 MG/250ML-% IV SOLN
2.0000 ug/min | INTRAVENOUS | Status: DC
  Administered 2022-09-11: 2 ug/min via INTRAVENOUS
  Filled 2022-09-11: qty 250

## 2022-09-11 MED ORDER — METRONIDAZOLE 500 MG/100ML IV SOLN
500.0000 mg | Freq: Once | INTRAVENOUS | Status: AC
  Administered 2022-09-11: 500 mg via INTRAVENOUS
  Filled 2022-09-11: qty 100

## 2022-09-11 MED ORDER — SODIUM CHLORIDE 0.9 % IV SOLN
2.0000 g | Freq: Once | INTRAVENOUS | Status: AC
  Administered 2022-09-11: 2 g via INTRAVENOUS
  Filled 2022-09-11: qty 10

## 2022-09-11 MED ORDER — ONDANSETRON HCL 4 MG/2ML IJ SOLN
4.0000 mg | Freq: Once | INTRAMUSCULAR | Status: AC
  Administered 2022-09-11: 4 mg via INTRAVENOUS
  Filled 2022-09-11: qty 2

## 2022-09-11 MED ORDER — ACETAMINOPHEN 500 MG PO TABS
1000.0000 mg | ORAL_TABLET | Freq: Once | ORAL | Status: AC
  Administered 2022-09-11: 1000 mg via ORAL
  Filled 2022-09-11: qty 2

## 2022-09-11 MED ORDER — VANCOMYCIN HCL IN DEXTROSE 1-5 GM/200ML-% IV SOLN
1000.0000 mg | Freq: Once | INTRAVENOUS | Status: DC
  Filled 2022-09-11: qty 200

## 2022-09-11 MED ORDER — SODIUM CHLORIDE 0.9 % IV SOLN
2.0000 g | Freq: Three times a day (TID) | INTRAVENOUS | Status: DC
  Administered 2022-09-12: 2 g via INTRAVENOUS
  Filled 2022-09-11 (×5): qty 10

## 2022-09-11 MED ORDER — SODIUM CHLORIDE 0.9 % IV SOLN
250.0000 mL | INTRAVENOUS | Status: DC
  Administered 2022-09-11: 250 mL via INTRAVENOUS

## 2022-09-11 MED ORDER — LACTATED RINGERS IV SOLN: INTRAVENOUS | Status: DC

## 2022-09-11 MED ORDER — VANCOMYCIN HCL 1250 MG/250ML IV SOLN
1250.0000 mg | INTRAVENOUS | Status: DC
  Administered 2022-09-11: 1250 mg via INTRAVENOUS
  Filled 2022-09-11: qty 250

## 2022-09-11 NOTE — Sepsis Progress Note (Signed)
Following for sepsis monitoring ?

## 2022-09-11 NOTE — ED Notes (Signed)
See triage notes. A/o. Color wnl. Shivering all over. C/o nausea and abd at this time.

## 2022-09-11 NOTE — ED Triage Notes (Signed)
Pt BIB [facility] with reports of abdominal pain and back pain. Pt reports chills. Denies fevers.

## 2022-09-11 NOTE — ED Provider Notes (Signed)
[place] EMERGENCY DEPARTMENT AT [facility] Provider Note   CSN: [id number] Arrival date & time: 09/11/22  1648     History Chief Complaint  Patient presents with   Abdominal Pain    HPI Gloria Rose is a 71 y.o. female presenting for abdominal pain. The patient presents to the emergency room with chief complaint of vomiting and severe shakiness. The symptoms started around 3 o'clock. The patient reports vomiting multiple times, including right before arriving at the emergency room. The patient is experiencing pain in the epigastric space but denies any pain in the lower abdomen.  The patient has a past medical history of diverticulitis, which typically causes pain in a lower location than the current episode. The patient also reports having approximately eight small bowel movements within a three-hour period but denies having diarrhea.  The patient has not taken any medications such as Tylenol for the symptoms and is unsure if they can keep oral medications down.  Patient's recorded medical, surgical, social, medication list and allergies were reviewed in the Snapshot window as part of the initial history.   Review of Systems   Review of Systems  Constitutional:  Negative for chills and fever.  HENT:  Negative for ear pain and sore throat.   Eyes:  Negative for pain and visual disturbance.  Respiratory:  Negative for cough and shortness of breath.   Cardiovascular:  Negative for chest pain and palpitations.  Gastrointestinal:  Positive for abdominal pain and nausea. Negative for vomiting.  Genitourinary:  Negative for dysuria and hematuria.  Musculoskeletal:  Negative for arthralgias and back pain.  Skin:  Negative for color change and rash.  Neurological:  Negative for seizures and syncope.  All other systems reviewed and are negative.   Physical Exam Updated Vital Signs BP (!) 73/43 (BP Location: Right Arm)   Pulse 88   Temp 99 F (37.2 C) (Oral)   Resp  20   SpO2 99%  Physical Exam Vitals and nursing note reviewed.  Constitutional:      General: She is not in acute distress.    Appearance: She is well-developed.  HENT:     Head: Normocephalic and atraumatic.  Eyes:     Conjunctiva/sclera: Conjunctivae normal.  Cardiovascular:     Rate and Rhythm: Normal rate and regular rhythm.     Heart sounds: No murmur heard. Pulmonary:     Effort: Pulmonary effort is normal. No respiratory distress.     Breath sounds: Normal breath sounds.  Abdominal:     General: There is no distension.     Palpations: Abdomen is soft.     Tenderness: There is abdominal tenderness in the epigastric area. There is no right CVA tenderness or left CVA tenderness.  Musculoskeletal:        General: No swelling or tenderness. Normal range of motion.     Cervical back: Neck supple.  Skin:    General: Skin is warm and dry.  Neurological:     General: No focal deficit present.     Mental Status: She is alert and oriented to person, place, and time. Mental status is at baseline.     Cranial Nerves: No cranial nerve deficit.      ED Course/ Medical Decision Making/ A&P    Procedures .Critical Care  Performed by: Glyn Ade, MD Authorized by: Glyn Ade, MD   Critical care provider statement:    Critical care time (minutes):  95   Critical care was necessary  to treat or prevent imminent or life-threatening deterioration of the following conditions:  Sepsis and shock   Critical care was time spent personally by me on the following activities:  Development of treatment plan with patient or surrogate, discussions with consultants, evaluation of patient's response to treatment, examination of patient, ordering and review of laboratory studies, ordering and review of radiographic studies, ordering and performing treatments and interventions, pulse oximetry, re-evaluation of patient's condition and review of old charts   Care discussed with: admitting  provider   .Central Line  Date/Time: 09/12/2022 12:23 AM  Performed by: Glyn Ade, MD Authorized by: Glyn Ade, MD   Consent:    Consent obtained:  Verbal   Consent given by:  Patient   Risks, benefits, and alternatives were discussed: yes     Alternatives discussed:  No treatment Universal protocol:    Patient identity confirmed:  Verbally with patient and arm band Pre-procedure details:    Indication(s): central venous access and hemodynamic monitoring     Skin preparation:  Alcohol Sedation:    Sedation type:  None Anesthesia:    Anesthesia method:  Local infiltration   Local anesthetic:  Lidocaine 1% w/o epi Procedure details:    Procedural supplies:  Triple lumen   Landmarks identified: yes     Ultrasound guidance: no     Number of attempts:  2   Successful placement: yes   Post-procedure details:    Post-procedure:  Dressing applied   Assessment:  Blood return through all ports   Procedure completion:  Tolerated    Medications Ordered in ED Medications  lactated ringers infusion (0 mLs Intravenous Paused 09/11/22 2216)  vancomycin (VANCOREADY) IVPB 1250 mg/250 mL (0 mg Intravenous Stopped 09/12/22 0008)  aztreonam (AZACTAM) 2 g in sodium chloride 0.9 % 100 mL IVPB (has no administration in time range)  0.9 %  sodium chloride infusion (250 mLs Intravenous New Bag/Given 09/11/22 2348)  norepinephrine (LEVOPHED) 4mg  in (0.016 mg/mL) premix infusion (7 mcg/min Intravenous Infusion Verify 09/12/22 0016)  ondansetron (ZOFRAN) injection 4 mg (4 mg Intravenous Given 09/11/22 1745)  acetaminophen (TYLENOL) tablet 1,000 mg (1,000 mg Oral Given 09/11/22 1736)  lactated ringers bolus 1,000 mL (0 mLs Intravenous Stopped 09/11/22 2216)  lactated ringers bolus 1,000 mL (0 mLs Intravenous Stopped 09/11/22 2053)  aztreonam (AZACTAM) 2 g in sodium chloride 0.9 % 100 mL IVPB (0 g Intravenous Stopped 09/11/22 2026)  metroNIDAZOLE (FLAGYL) IVPB 500 mg (0 mg Intravenous Stopped 09/11/22  2215)  iohexol (OMNIPAQUE) 300 MG/ML solution 100 mL (100 mLs Intravenous Contrast Given 09/11/22 1851)  lactated ringers bolus 1,000 mL ( Intravenous Infusion Verify 09/12/22 0016)   Medical Decision Making:   Gloria Rose is a 72 y.o. female who presented to the ED today with abdominal pain, detailed above.    Patient placed on continuous vitals and telemetry monitoring while in ED which was reviewed periodically.  Complete initial physical exam performed, notably the patient  was HDS in NAD.     Reviewed and confirmed nursing documentation for past medical history, family history, social history.    Initial Assessment/Plan:    #Abdominal pain with acute vomiting Most likely diagnosis is recurrent diverticulitis. Other diagnoses were considered including (but not limited to) gastroenteritis, colitis, small bowel obstruction, appendicitis, cholecystitis, pancreatitis, nephrolithiasis, UTI, pyleonephritis     - Obtain complete blood count, comprehensive metabolic panel, lipase, and lactate levels      - Administer IV fluids for hydration    - Monitor  vital signs closely    - CT scan of the chest, abdomen, and pelvis with contrast to evaluate for pancreatitis or other abdominal pathology    - NPO (nothing by mouth) until further evaluation      - Pain management with IV analgesics as needed  #Follow-up and reassessment    - Reassess the patient's condition after initial interventions and diagnostic tests    - Adjust the treatment plan as needed based on the findings and patient's response to therapy    - Consider consultation with a specialist (e.g., gastroenterologist) if the patient's condition does not improve or worsens   Initial Study Results:   Laboratory  All laboratory results reviewed without evidence of clinically relevant pathology.     EKG EKG was reviewed independently. Rate, rhythm, axis, intervals all examined and without medically relevant abnormality. ST segments  without concerns for elevations.    Radiology All images reviewed independently. Agree with radiology report at this time.   DG Chest Port 1 View  Result Date: 09/11/2022 CLINICAL DATA:  Nasogastric tube placement EXAM: PORTABLE CHEST 1 VIEW COMPARISON:  Same date chest CT FINDINGS: Tip and side port of the enteric tube below the diaphragm in the stomach. Normal heart size and mediastinal contours. No focal airspace disease, pleural effusion or pneumothorax. Thoracic spondylosis. IMPRESSION: Tip and side port of the enteric tube below the diaphragm in the stomach. Electronically Signed   By: Narda Rutherford M.D.   On: 09/11/2022 22:31   CT CHEST ABDOMEN PELVIS W CONTRAST  Result Date: 09/11/2022 CLINICAL DATA:  Sepsis, general abdominal and back pain, chills, EXAM: CT CHEST, ABDOMEN, AND PELVIS WITH CONTRAST TECHNIQUE: Multidetector CT imaging of the chest, abdomen and pelvis was performed following the standard protocol during bolus administration of intravenous contrast. RADIATION DOSE REDUCTION: This exam was performed according to the departmental dose-optimization program which includes automated exposure control, adjustment of the mA and/or kV according to patient size and/or use of iterative reconstruction technique. CONTRAST:  OMNIPAQUE IOHEXOL 300 MG/ML  SOLN COMPARISON:  None available FINDINGS: CT CHEST FINDINGS Cardiovascular: Normal heart size. Coronary artery and aortic atherosclerotic calcification. No pericardial effusion. Mediastinum/Nodes: Trachea and esophagus are unremarkable. No thoracic adenopathy. Lungs/Pleura: No focal consolidation, pleural effusion, or pneumothorax. Musculoskeletal: No acute fracture. Cervical spine fusion hardware. Advanced arthritis both shoulders. CT ABDOMEN PELVIS FINDINGS Hepatobiliary: Cholelithiasis. Question mild gallbladder wall thickening. The gallbladder is mildly distended. The gallbladder is mildly distended. No pericholecystic fluid or  stranding. No biliary dilation. Unremarkable liver. Pancreas: Unremarkable. Spleen: Unremarkable. Adrenals/Urinary Tract: Normal adrenal glands. No urinary calculi or hydronephrosis. Unremarkable bladder. Stomach/Bowel: Normal caliber large and small bowel. Normal appendix. No bowel wall thickening. Stomach is within normal limits. Vascular/Lymphatic: Aortic atherosclerosis. 1.1 cm enhancing nodule in the porta hepatis (series 2/image 63 and 3/62). Reproductive: Uterus and bilateral adnexa are unremarkable. Other: No free intraperitoneal fluid or air. Musculoskeletal: No acute fracture. IMPRESSION: 1. Cholelithiasis with questionable mild gallbladder wall thickening. If there is clinical concern for acute cholecystitis, consider further evaluation with ultrasound. 2. 1.1 cm enhancing nodule in the porta hepatis. This may represent a lymph node. Short interval follow-up in 3 months is recommended. Aortic Atherosclerosis (ICD10-I70.0). Electronically Signed   By: Minerva Fester M.D.   On: 09/11/2022 20:27   CT Head Wo Contrast  Result Date: 09/04/2022 CLINICAL DATA:  Head trauma, loss of consciousness.  Fall. EXAM: CT HEAD WITHOUT CONTRAST TECHNIQUE: Contiguous axial images were obtained from the base of the skull through  the vertex without intravenous contrast. RADIATION DOSE REDUCTION: This exam was performed according to the departmental dose-optimization program which includes automated exposure control, adjustment of the mA and/or kV according to patient size and/or use of iterative reconstruction technique. COMPARISON:  None Available. FINDINGS: Brain: No acute intracranial hemorrhage, midline shift or mass effect. No extra-axial fluid collection. Mild periventricular white matter hypodensities are present bilaterally. No hydrocephalus. Vascular: No hyperdense vessel or unexpected calcification. Skull: Normal. Negative for fracture or focal lesion. Sinuses/Orbits: No acute finding. Other: Increased density  is noted in the scalp over the parietal bone on the left, suggesting contusion. IMPRESSION: No acute intracranial process. Electronically Signed   By: Thornell Sartorius M.D.   On: 09/04/2022 20:19    Final Reassessment and Plan:   Exquisitely ill.  Substantial mount of time at bedside.  She was consented for a central line which was placed as above.  She is starting to stabilize on Levophed.  Discussed with hospitalist x 2.  They have agreed that patient can stay at [facility] for surgical consultation in a.m. informal ultrasound study while getting antibiotics overnight. She has penicillin anaphylactic per her records and was treated with aztreonam, Vanco and Flagyl. On pressors titrating up at this time with hospitalist planning to come see at bedside.   Disposition:   Based on the above findings, I believe this patient is stable for admission.    Patient/family educated about specific findings on our evaluation and explained exact reasons for admission.  Patient/family educated about clinical situation and time was allowed to answer questions.   Admission team communicated with and agreed with need for admission. Patient admitted. Patient ready to move at this time.     Emergency Department Medication Summary:   Medications  lactated ringers infusion (0 mLs Intravenous Paused 09/11/22 2216)  vancomycin (VANCOREADY) IVPB 1250 mg/250 mL (0 mg Intravenous Stopped 09/12/22 0008)  aztreonam (AZACTAM) 2 g in sodium chloride 0.9 % 100 mL IVPB (has no administration in time range)  0.9 %  sodium chloride infusion (250 mLs Intravenous New Bag/Given 09/11/22 2348)  norepinephrine (LEVOPHED) 4mg  in (0.016 mg/mL) premix infusion (7 mcg/min Intravenous Infusion Verify 09/12/22 0016)  ondansetron (ZOFRAN) injection 4 mg (4 mg Intravenous Given 09/11/22 1745)  acetaminophen (TYLENOL) tablet 1,000 mg (1,000 mg Oral Given 09/11/22 1736)  lactated ringers bolus 1,000 mL (0 mLs Intravenous Stopped 09/11/22 2216)   lactated ringers bolus 1,000 mL (0 mLs Intravenous Stopped 09/11/22 2053)  aztreonam (AZACTAM) 2 g in sodium chloride 0.9 % 100 mL IVPB (0 g Intravenous Stopped 09/11/22 2026)  metroNIDAZOLE (FLAGYL) IVPB 500 mg (0 mg Intravenous Stopped 09/11/22 2215)  iohexol (OMNIPAQUE) 300 MG/ML solution 100 mL (100 mLs Intravenous Contrast Given 09/11/22 1851)  lactated ringers bolus 1,000 mL ( Intravenous Infusion Verify 09/12/22 0016)             Clinical Impression: No diagnosis found.   Admit   Final Clinical Impression(s) / ED Diagnoses Final diagnoses:  None    Rx / DC Orders ED Discharge Orders     None         Glyn Ade, MD 09/12/22 [phone]

## 2022-09-11 NOTE — Progress Notes (Signed)
Pharmacy Antibiotic Note  Gloria Rose is a 72 y.o. female admitted on 09/11/2022 with possible infection of an unknown source. Patient presents with chills, fever (101.8 F) abdominal and back pain. WBC 10.7. Patient states they have a penicillin allergy with anaphylaxis and hives. No history of cephalosporin use could be determined.  Pharmacy has been consulted for Aztreonam and Vancomycin dosing.  Plan: Aztreonam 2gm IV q8h Vancomycin 1250mg  IV q24h for eAUC of 465 using Scr of 0.9 Monitor renal function and clinical course F/u on cultures and sensititivities    Temp (24hrs), Avg:100.9 F (38.3 C), Min:98.7 F (37.1 C), Max:102.2 F (39 C)  Recent Labs  Lab 09/04/22 2011 09/11/22 1748  WBC 8.5 10.7*  CREATININE 0.95 0.90  LATICACIDVEN  --  2.7*    Estimated Creatinine Clearance: 58.3 mL/min (by C-G formula based on SCr of 0.9 mg/dL).    Allergies  Allergen Reactions   Penicillins Anaphylaxis and Hives    Difficulty breathing    Antimicrobials this admission: 9/5 Aztreonam >>  9/5 Vancomycin >>   Dose adjustments this admission: N/a  Microbiology results: 9/5 BCx: ordered 9/5 UCx: ordered   Sahiti Joswick A. Jeanella Craze, PharmD, BCPS, FNKF Clinical Pharmacist  Please utilize Amion for appropriate phone number to reach the unit pharmacist [facility])  09/11/2022 6:52 PM

## 2022-09-11 NOTE — ED Notes (Addendum)
Pt taken to ct. Shivering better. States nausea is better. Pt unable to urinate at this time

## 2022-09-12 ENCOUNTER — Inpatient Hospital Stay (HOSPITAL_COMMUNITY): Payer: Medicare HMO

## 2022-09-12 ENCOUNTER — Emergency Department (HOSPITAL_COMMUNITY): Payer: Medicare HMO

## 2022-09-12 ENCOUNTER — Encounter (HOSPITAL_COMMUNITY): Payer: Self-pay | Admitting: Internal Medicine

## 2022-09-12 DIAGNOSIS — K219 Gastro-esophageal reflux disease without esophagitis: Secondary | ICD-10-CM | POA: Diagnosis present

## 2022-09-12 DIAGNOSIS — R7401 Elevation of levels of liver transaminase levels: Secondary | ICD-10-CM | POA: Diagnosis present

## 2022-09-12 DIAGNOSIS — E782 Mixed hyperlipidemia: Secondary | ICD-10-CM | POA: Diagnosis present

## 2022-09-12 DIAGNOSIS — A4151 Sepsis due to Escherichia coli [E. coli]: Secondary | ICD-10-CM | POA: Diagnosis present

## 2022-09-12 DIAGNOSIS — R739 Hyperglycemia, unspecified: Secondary | ICD-10-CM | POA: Insufficient documentation

## 2022-09-12 DIAGNOSIS — E1165 Type 2 diabetes mellitus with hyperglycemia: Secondary | ICD-10-CM | POA: Diagnosis present

## 2022-09-12 DIAGNOSIS — K8000 Calculus of gallbladder with acute cholecystitis without obstruction: Secondary | ICD-10-CM | POA: Diagnosis present

## 2022-09-12 DIAGNOSIS — R6521 Severe sepsis with septic shock: Secondary | ICD-10-CM | POA: Diagnosis present

## 2022-09-12 DIAGNOSIS — E872 Acidosis, unspecified: Secondary | ICD-10-CM

## 2022-09-12 DIAGNOSIS — Z1152 Encounter for screening for COVID-19: Secondary | ICD-10-CM | POA: Diagnosis not present

## 2022-09-12 DIAGNOSIS — A419 Sepsis, unspecified organism: Secondary | ICD-10-CM | POA: Diagnosis not present

## 2022-09-12 DIAGNOSIS — I10 Essential (primary) hypertension: Secondary | ICD-10-CM | POA: Diagnosis present

## 2022-09-12 DIAGNOSIS — Z880 Allergy status to penicillin: Secondary | ICD-10-CM | POA: Diagnosis not present

## 2022-09-12 DIAGNOSIS — R7989 Other specified abnormal findings of blood chemistry: Secondary | ICD-10-CM | POA: Diagnosis present

## 2022-09-12 DIAGNOSIS — E871 Hypo-osmolality and hyponatremia: Secondary | ICD-10-CM | POA: Diagnosis present

## 2022-09-12 DIAGNOSIS — Z79899 Other long term (current) drug therapy: Secondary | ICD-10-CM | POA: Diagnosis not present

## 2022-09-12 DIAGNOSIS — B957 Other staphylococcus as the cause of diseases classified elsewhere: Secondary | ICD-10-CM | POA: Diagnosis present

## 2022-09-12 DIAGNOSIS — R1010 Upper abdominal pain, unspecified: Secondary | ICD-10-CM | POA: Diagnosis not present

## 2022-09-12 DIAGNOSIS — K8020 Calculus of gallbladder without cholecystitis without obstruction: Secondary | ICD-10-CM | POA: Diagnosis not present

## 2022-09-12 DIAGNOSIS — G4700 Insomnia, unspecified: Secondary | ICD-10-CM | POA: Diagnosis present

## 2022-09-12 DIAGNOSIS — R109 Unspecified abdominal pain: Secondary | ICD-10-CM | POA: Diagnosis not present

## 2022-09-12 DIAGNOSIS — Z96652 Presence of left artificial knee joint: Secondary | ICD-10-CM | POA: Diagnosis present

## 2022-09-12 DIAGNOSIS — K660 Peritoneal adhesions (postprocedural) (postinfection): Secondary | ICD-10-CM | POA: Diagnosis present

## 2022-09-12 DIAGNOSIS — E876 Hypokalemia: Secondary | ICD-10-CM | POA: Diagnosis present

## 2022-09-12 DIAGNOSIS — K828 Other specified diseases of gallbladder: Secondary | ICD-10-CM | POA: Diagnosis not present

## 2022-09-12 DIAGNOSIS — N179 Acute kidney failure, unspecified: Secondary | ICD-10-CM | POA: Diagnosis present

## 2022-09-12 DIAGNOSIS — R1013 Epigastric pain: Secondary | ICD-10-CM

## 2022-09-12 DIAGNOSIS — Z801 Family history of malignant neoplasm of trachea, bronchus and lung: Secondary | ICD-10-CM | POA: Diagnosis not present

## 2022-09-12 DIAGNOSIS — R918 Other nonspecific abnormal finding of lung field: Secondary | ICD-10-CM | POA: Diagnosis not present

## 2022-09-12 DIAGNOSIS — R1110 Vomiting, unspecified: Secondary | ICD-10-CM | POA: Diagnosis not present

## 2022-09-12 DIAGNOSIS — K8012 Calculus of gallbladder with acute and chronic cholecystitis without obstruction: Secondary | ICD-10-CM | POA: Diagnosis present

## 2022-09-12 DIAGNOSIS — F32A Depression, unspecified: Secondary | ICD-10-CM | POA: Diagnosis present

## 2022-09-12 DIAGNOSIS — R601 Generalized edema: Secondary | ICD-10-CM | POA: Diagnosis not present

## 2022-09-12 DIAGNOSIS — Z85820 Personal history of malignant melanoma of skin: Secondary | ICD-10-CM | POA: Diagnosis not present

## 2022-09-12 DIAGNOSIS — R112 Nausea with vomiting, unspecified: Secondary | ICD-10-CM

## 2022-09-12 DIAGNOSIS — K810 Acute cholecystitis: Secondary | ICD-10-CM | POA: Diagnosis not present

## 2022-09-12 HISTORY — DX: Nausea with vomiting, unspecified: R11.2

## 2022-09-12 LAB — CBC
HCT: 33.8 % — ABNORMAL LOW (ref 36.0–46.0)
Hemoglobin: 11.3 g/dL — ABNORMAL LOW (ref 12.0–15.0)
MCH: 27.6 pg (ref 26.0–34.0)
MCHC: 33.4 g/dL (ref 30.0–36.0)
MCV: 82.4 fL (ref 80.0–100.0)
Platelets: 222 10*3/uL (ref 150–400)
RBC: 4.1 MIL/uL (ref 3.87–5.11)
RDW: 13.6 % (ref 11.5–15.5)
WBC: 27.3 10*3/uL — ABNORMAL HIGH (ref 4.0–10.5)
nRBC: 0 % (ref 0.0–0.2)

## 2022-09-12 LAB — COMPREHENSIVE METABOLIC PANEL
ALT: 111 U/L — ABNORMAL HIGH (ref 0–44)
AST: 158 U/L — ABNORMAL HIGH (ref 15–41)
Albumin: 2.8 g/dL — ABNORMAL LOW (ref 3.5–5.0)
Alkaline Phosphatase: 100 U/L (ref 38–126)
Anion gap: 10 (ref 5–15)
BUN: 22 mg/dL (ref 8–23)
CO2: 24 mmol/L (ref 22–32)
Calcium: 8.4 mg/dL — ABNORMAL LOW (ref 8.9–10.3)
Chloride: 100 mmol/L (ref 98–111)
Creatinine, Ser: 1.2 mg/dL — ABNORMAL HIGH (ref 0.44–1.00)
GFR, Estimated: 48 mL/min — ABNORMAL LOW (ref >60)
Glucose, Bld: 246 mg/dL — ABNORMAL HIGH (ref 70–99)
Potassium: 3.5 mmol/L (ref 3.5–5.1)
Sodium: 134 mmol/L — ABNORMAL LOW (ref 135–145)
Total Bilirubin: 3.4 mg/dL — ABNORMAL HIGH (ref 0.3–1.2)
Total Protein: 5.7 g/dL — ABNORMAL LOW (ref 6.5–8.1)

## 2022-09-12 LAB — BLOOD CULTURE ID PANEL (REFLEXED) - BCID2
A.calcoaceticus-baumannii: NOT DETECTED
A.calcoaceticus-baumannii: NOT DETECTED
Bacteroides fragilis: NOT DETECTED
Bacteroides fragilis: NOT DETECTED
CTX-M ESBL: NOT DETECTED
Candida albicans: NOT DETECTED
Candida albicans: NOT DETECTED
Candida auris: NOT DETECTED
Candida auris: NOT DETECTED
Candida glabrata: NOT DETECTED
Candida glabrata: NOT DETECTED
Candida krusei: NOT DETECTED
Candida krusei: NOT DETECTED
Candida parapsilosis: NOT DETECTED
Candida parapsilosis: NOT DETECTED
Candida tropicalis: NOT DETECTED
Candida tropicalis: NOT DETECTED
Carbapenem resist OXA 48 LIKE: NOT DETECTED
Carbapenem resistance IMP: NOT DETECTED
Carbapenem resistance KPC: NOT DETECTED
Carbapenem resistance NDM: NOT DETECTED
Carbapenem resistance VIM: NOT DETECTED
Cryptococcus neoformans/gattii: NOT DETECTED
Cryptococcus neoformans/gattii: NOT DETECTED
Enterobacter cloacae complex: NOT DETECTED
Enterobacter cloacae complex: NOT DETECTED
Enterobacterales: DETECTED — AB
Enterobacterales: NOT DETECTED
Enterococcus Faecium: NOT DETECTED
Enterococcus Faecium: NOT DETECTED
Enterococcus faecalis: NOT DETECTED
Enterococcus faecalis: NOT DETECTED
Escherichia coli: DETECTED — AB
Escherichia coli: NOT DETECTED
Haemophilus influenzae: NOT DETECTED
Haemophilus influenzae: NOT DETECTED
Klebsiella aerogenes: NOT DETECTED
Klebsiella aerogenes: NOT DETECTED
Klebsiella oxytoca: DETECTED — AB
Klebsiella oxytoca: NOT DETECTED
Klebsiella pneumoniae: NOT DETECTED
Klebsiella pneumoniae: NOT DETECTED
Listeria monocytogenes: NOT DETECTED
Listeria monocytogenes: NOT DETECTED
Methicillin resistance mecA/C: NOT DETECTED
Neisseria meningitidis: NOT DETECTED
Neisseria meningitidis: NOT DETECTED
Proteus species: NOT DETECTED
Proteus species: NOT DETECTED
Pseudomonas aeruginosa: NOT DETECTED
Pseudomonas aeruginosa: NOT DETECTED
Salmonella species: NOT DETECTED
Salmonella species: NOT DETECTED
Serratia marcescens: NOT DETECTED
Serratia marcescens: NOT DETECTED
Staphylococcus aureus (BCID): NOT DETECTED
Staphylococcus aureus (BCID): NOT DETECTED
Staphylococcus epidermidis: NOT DETECTED
Staphylococcus epidermidis: DETECTED — AB
Staphylococcus lugdunensis: NOT DETECTED
Staphylococcus lugdunensis: NOT DETECTED
Staphylococcus species: NOT DETECTED
Staphylococcus species: DETECTED — AB
Stenotrophomonas maltophilia: NOT DETECTED
Stenotrophomonas maltophilia: NOT DETECTED
Streptococcus agalactiae: NOT DETECTED
Streptococcus agalactiae: NOT DETECTED
Streptococcus pneumoniae: NOT DETECTED
Streptococcus pneumoniae: NOT DETECTED
Streptococcus pyogenes: NOT DETECTED
Streptococcus pyogenes: NOT DETECTED
Streptococcus species: NOT DETECTED
Streptococcus species: NOT DETECTED

## 2022-09-12 LAB — GLUCOSE, CAPILLARY
Glucose-Capillary: 209 mg/dL — ABNORMAL HIGH (ref 70–99)
Glucose-Capillary: 134 mg/dL — ABNORMAL HIGH (ref 70–99)
Glucose-Capillary: 99 mg/dL (ref 70–99)
Glucose-Capillary: 151 mg/dL — ABNORMAL HIGH (ref 70–99)

## 2022-09-12 LAB — MAGNESIUM: Magnesium: 1.3 mg/dL — ABNORMAL LOW (ref 1.7–2.4)

## 2022-09-12 LAB — LACTIC ACID, PLASMA
Lactic Acid, Venous: 1 mmol/L (ref 0.5–1.9)
Lactic Acid, Venous: 2 mmol/L (ref 0.5–1.9)
Lactic Acid, Venous: 4.3 mmol/L (ref 0.5–1.9)

## 2022-09-12 MED ORDER — FENTANYL CITRATE PF 50 MCG/ML IJ SOSY
25.0000 ug | PREFILLED_SYRINGE | INTRAMUSCULAR | Status: DC | PRN
  Filled 2022-09-12: qty 1

## 2022-09-12 MED ORDER — NOREPINEPHRINE 4 MG/250ML-% IV SOLN
0.0000 ug/min | INTRAVENOUS | Status: DC
  Administered 2022-09-12 (×2): 10 ug/min via INTRAVENOUS
  Filled 2022-09-12: qty 250

## 2022-09-12 MED ORDER — LACTATED RINGERS IV BOLUS
1000.0000 mL | Freq: Once | INTRAVENOUS | Status: AC
  Administered 2022-09-12: 1000 mL via INTRAVENOUS

## 2022-09-12 MED ORDER — MAGNESIUM SULFATE 4 GM/100ML IV SOLN
4.0000 g | Freq: Once | INTRAVENOUS | Status: AC
  Administered 2022-09-12: 4 g via INTRAVENOUS
  Filled 2022-09-12: qty 100

## 2022-09-12 MED ORDER — FENTANYL CITRATE PF 50 MCG/ML IJ SOSY
50.0000 ug | PREFILLED_SYRINGE | Freq: Once | INTRAMUSCULAR | Status: AC
  Administered 2022-09-12: 50 ug via INTRAVENOUS
  Filled 2022-09-12: qty 1

## 2022-09-12 MED ORDER — POTASSIUM CHLORIDE 10 MEQ/100ML IV SOLN
10.0000 meq | INTRAVENOUS | Status: AC
  Administered 2022-09-12 (×3): 10 meq via INTRAVENOUS
  Filled 2022-09-12 (×3): qty 100

## 2022-09-12 MED ORDER — GADOBUTROL 1 MMOL/ML IV SOLN
8.0000 mL | Freq: Once | INTRAVENOUS | Status: AC | PRN
  Administered 2022-09-12: 8 mL via INTRAVENOUS

## 2022-09-12 MED ORDER — VANCOMYCIN HCL IN DEXTROSE 1-5 GM/200ML-% IV SOLN
1000.0000 mg | INTRAVENOUS | Status: DC
  Administered 2022-09-12: 1000 mg via INTRAVENOUS
  Filled 2022-09-12: qty 200

## 2022-09-12 MED ORDER — SODIUM CHLORIDE 0.9 % IV SOLN
2.0000 g | INTRAVENOUS | Status: DC
  Administered 2022-09-13 – 2022-09-16 (×4): 2 g via INTRAVENOUS
  Filled 2022-09-12 (×4): qty 20

## 2022-09-12 MED ORDER — METRONIDAZOLE 500 MG/100ML IV SOLN
500.0000 mg | Freq: Two times a day (BID) | INTRAVENOUS | Status: DC
  Administered 2022-09-12 – 2022-09-16 (×9): 500 mg via INTRAVENOUS
  Filled 2022-09-12 (×10): qty 100

## 2022-09-12 MED ORDER — SODIUM CHLORIDE 0.9 % IV SOLN: INTRAVENOUS | Status: AC

## 2022-09-12 MED ORDER — VANCOMYCIN HCL IN DEXTROSE 1-5 GM/200ML-% IV SOLN: 1000.0000 mg | INTRAVENOUS | Status: DC

## 2022-09-12 MED ORDER — SODIUM CHLORIDE 0.9 % IV SOLN
2.0000 g | Freq: Two times a day (BID) | INTRAVENOUS | Status: DC
  Administered 2022-09-12: 2 g via INTRAVENOUS
  Filled 2022-09-12: qty 12.5

## 2022-09-12 MED ORDER — CHLORHEXIDINE GLUCONATE CLOTH 2 % EX PADS
6.0000 | MEDICATED_PAD | Freq: Every day | CUTANEOUS | Status: DC
  Administered 2022-09-12 – 2022-09-14 (×3): 6 via TOPICAL

## 2022-09-12 MED ORDER — HEPARIN SODIUM (PORCINE) 5000 UNIT/ML IJ SOLN
5000.0000 [IU] | Freq: Three times a day (TID) | INTRAMUSCULAR | Status: DC
  Administered 2022-09-12 – 2022-09-16 (×11): 5000 [IU] via SUBCUTANEOUS
  Filled 2022-09-12 (×11): qty 1

## 2022-09-12 NOTE — Progress Notes (Signed)
eLink Physician-Brief Progress Note Patient Name: Gloria Rose DOB: 1950-02-14 MRN: [id number]   Date of Service  09/12/2022  HPI/Events of Note  72 year old with a history of essential hypertension dyslipidemia, depression who initially presents with abdominal pain and emesis found to have septic shock with concern for acute cholecystitis admitted to the hospital service.  On presentation the patient is hypotensive requiring norepinephrine infusion, treated with broad-spectrum antibiotics.  Results are consistent with hypokalemia, mild transaminitis, and mild transaminitis.  Blood cultures are pending.  CT abdomen concerning for gallbladder wall thickness and potential cholecystitis.  Chest radiograph showing a right IJ catheter in appropriate positioning.  eICU Interventions  Fluid resuscitated with 4 L of crystalloid infusion with ongoing NS.  Initiated norepinephrine with MAP goal of 65.  Antibiotics with vancomycin and aztreonam in the setting of penicillin allergy  Surgical consult pending based off of abdominal ultrasound.  DVT prophylaxis with heparin subcutaneous GI prophylaxis not currently indicated.  Could resume home PPI.     Intervention Category Evaluation Type: New Patient Evaluation  Gloria Rose January 09/12/2022, 6:00 AM

## 2022-09-12 NOTE — Progress Notes (Signed)
   09/12/22 0845  TOC Brief Assessment  Insurance and Status Reviewed  Patient has primary care physician Yes  Home environment has been reviewed from home  Prior level of function: independent  Prior/Current Home Services No current home services  Social Determinants of Health Reivew SDOH reviewed no interventions necessary  Readmission risk has been reviewed Yes  Transition of care needs no transition of care needs at this time    Transition of Care Department [facility]) has reviewed patient and no TOC needs have been identified at this time. We will continue to monitor patient advancement through interdisciplinary progression rounds. If new patient transition needs arise, please place a TOC consult.

## 2022-09-12 NOTE — Plan of Care (Signed)

## 2022-09-12 NOTE — Progress Notes (Signed)
Patient seen and evaluated, chart reviewed, please see EMR for updated orders. Please see full H&P dictated by admitting physician Dr Thomes Dinning for same date of service.    Brief Summary 72 y.o. female with medical history significant of hypertension, GERD, hyperlipidemia, depression admitted on 09/12/2022 with sepsis and bacteremia secondary to acute calculus cholecystitis   A/p 1) severe sepsis and bacteremia with septic shock secondary to acute calculus cholecystitis--- positive blood cultures noted -Continue IV Flagyl, vancomycin and Rocephin pending further culture data -CT abdomen and pelvis and right upper quadrant ultrasound suggest calculus cholecystitis ---MRCP shows abnormal gallbladder with progressive wall thickening and edema. Gallstones with a stone towards the neck. Please correlate for acute cholecystitis. -General Surgery consult appreciated tentatively surgery is planned for Monday, 09/15/2022 -Wean off IV Levophed if able -Lactic acidosis noted -Elevated LFTs noted -Continue IV fluids  2)Hypokalemia--replace and recheck  3)GERD--continue Protonix  4)HLD--hold statin due to elevated LFTs in the setting of gallstones/cholecystitis   CRITICAL CARE Performed by: Shon Hale  Total critical care time: 48 minutes  Critical care time was exclusive of separately billable procedures and treating other patients.  Critical care was necessary to treat or prevent imminent or life-threatening deterioration. -Persistent hypotension in the setting of severe sepsis with septic shock with positive blood cultures requiring Levophed for pressure support  Critical care was time spent personally by me on the following activities: development of treatment plan with patient and/or surrogate as well as nursing, discussions with consultants, evaluation of patient's response to treatment, examination of patient, obtaining history from patient or surrogate, ordering and performing  treatments and interventions, ordering and review of laboratory studies, ordering and review of radiographic studies, pulse oximetry and re-evaluation of patient's condition.  Shon Hale, MD

## 2022-09-12 NOTE — ED Notes (Signed)
Patient O2 sat >92% 2L applied via n/c  oxygen sat increased to 95%.

## 2022-09-12 NOTE — Progress Notes (Signed)
PHARMACY - PHYSICIAN COMMUNICATION CRITICAL VALUE ALERT - BLOOD CULTURE IDENTIFICATION (BCID)  Gloria Rose is an 72 y.o. female who presented to [facility] on 09/11/2022 with a chief complaint of abdominal pain  Assessment:  1 of 2 sets, GNRs, BCID identifies E. Coli and K. Oxytoca with no resistance detected. Source is likely intra-abdominal in the setting of cholecystitis and/or cholangitis  Name of physician (or Provider) Contacted: Dr. Mariea Clonts  Current antibiotics: Cefepime, vancomycin, metronidazole  Changes to prescribed antibiotics recommended:  Plan was accepted by physician. Discontinuing cefepime and vancomycin. Starting ceftriaxone 2 g IV q24H and continuing metronidazole.  Results for orders placed or performed during the hospital encounter of 09/11/22  Blood Culture ID Panel (Reflexed) (Collected: 09/11/2022  7:27 PM)  Result Value Ref Range   Enterococcus faecalis NOT DETECTED NOT DETECTED   Enterococcus Faecium NOT DETECTED NOT DETECTED   Listeria monocytogenes NOT DETECTED NOT DETECTED   Staphylococcus species NOT DETECTED NOT DETECTED   Staphylococcus aureus (BCID) NOT DETECTED NOT DETECTED   Staphylococcus epidermidis NOT DETECTED NOT DETECTED   Staphylococcus lugdunensis NOT DETECTED NOT DETECTED   Streptococcus species NOT DETECTED NOT DETECTED   Streptococcus agalactiae NOT DETECTED NOT DETECTED   Streptococcus pneumoniae NOT DETECTED NOT DETECTED   Streptococcus pyogenes NOT DETECTED NOT DETECTED   A.calcoaceticus-baumannii NOT DETECTED NOT DETECTED   Bacteroides fragilis NOT DETECTED NOT DETECTED   Enterobacterales DETECTED (A) NOT DETECTED   Enterobacter cloacae complex NOT DETECTED NOT DETECTED   Escherichia coli DETECTED (A) NOT DETECTED   Klebsiella aerogenes NOT DETECTED NOT DETECTED   Klebsiella oxytoca DETECTED (A) NOT DETECTED   Klebsiella pneumoniae NOT DETECTED NOT DETECTED   Proteus species NOT DETECTED NOT DETECTED   Salmonella species NOT  DETECTED NOT DETECTED   Serratia marcescens NOT DETECTED NOT DETECTED   Haemophilus influenzae NOT DETECTED NOT DETECTED   Neisseria meningitidis NOT DETECTED NOT DETECTED   Pseudomonas aeruginosa NOT DETECTED NOT DETECTED   Stenotrophomonas maltophilia NOT DETECTED NOT DETECTED   Candida albicans NOT DETECTED NOT DETECTED   Candida auris NOT DETECTED NOT DETECTED   Candida glabrata NOT DETECTED NOT DETECTED   Candida krusei NOT DETECTED NOT DETECTED   Candida parapsilosis NOT DETECTED NOT DETECTED   Candida tropicalis NOT DETECTED NOT DETECTED   Cryptococcus neoformans/gattii NOT DETECTED NOT DETECTED   CTX-M ESBL NOT DETECTED NOT DETECTED   Carbapenem resistance IMP NOT DETECTED NOT DETECTED   Carbapenem resistance KPC NOT DETECTED NOT DETECTED   Carbapenem resistance NDM NOT DETECTED NOT DETECTED   Carbapenem resist OXA 48 LIKE NOT DETECTED NOT DETECTED   Carbapenem resistance VIM NOT DETECTED NOT DETECTED    Will M. Dareen Piano, PharmD Clinical Pharmacist 09/12/2022 2:28 PM

## 2022-09-12 NOTE — Progress Notes (Signed)
Pharmacy Antibiotic Note  Gloria Rose is a 72 y.o. female admitted on 09/11/2022 with possible infection of an unknown source. Patient presents with chills, fever (101.8 F) abdominal and back pain. WBC 10.7. Patient states they have a penicillin allergy with anaphylaxis and hives. No history of cephalosporin use could be determined.  Pharmacy has been consulted for Aztreonam and Vancomycin dosing.  Plan: Adjust vancomycin to 1000 mg IV q24H eAUC 481, Cmax 30, Cmin 13 Scr 1.2, IBW, Vd 0.72 Continue aztreonam 2 g IV q8H Follow up culture results to assess for antibiotic optimization. Monitor renal function to assess for any necessary antibiotic dosing changes.    Temp (24hrs), Avg:99.8 F (37.7 C), Min:97.8 F (36.6 C), Max:102.2 F (39 C)  Recent Labs  Lab 09/11/22 1748 09/11/22 2009 09/11/22 2221 09/11/22 2346 09/12/22 0603  WBC 10.7*  --   --   --  27.3*  CREATININE 0.90  --   --   --  1.20*  LATICACIDVEN 2.7* 4.3* 4.3* 4.3* 2.0*    Estimated Creatinine Clearance: 43.7 mL/min (A) (by C-G formula based on SCr of 1.2 mg/dL (H)).    Allergies  Allergen Reactions   Penicillins Anaphylaxis and Hives    Difficulty breathing    Antimicrobials this admission: 9/5 Aztreonam >>  9/5 Vancomycin >>   Dose adjustments this admission: N/a  Microbiology results: 9/5 BCx: GNRs 9/5 UCx: ordered   Will M. Dareen Piano, PharmD Clinical Pharmacist 09/12/2022 7:55 AM

## 2022-09-12 NOTE — Progress Notes (Signed)
Date and time results received: 09/12/22 1020  (use smartphrase ".now" to insert current time)  Test: Blood Culture Critical Value: Aerobic bottle positive gram negative rods  Name of Provider Notified: Dr. Marisa Severin  Orders Received? Or Actions Taken?: MD notified, awaiting orders

## 2022-09-12 NOTE — Plan of Care (Signed)
  Problem: Education: Goal: Knowledge of General Education information will improve Description Including pain rating scale, medication(s)/side effects and non-pharmacologic comfort measures Outcome: Progressing   Problem: Health Behavior/Discharge Planning: Goal: Ability to manage health-related needs will improve Outcome: Progressing   Problem: Clinical Measurements: Goal: Ability to maintain clinical measurements within normal limits will improve Outcome: Progressing Goal: Diagnostic test results will improve Outcome: Progressing Goal: Respiratory complications will improve Outcome: Progressing Goal: Cardiovascular complication will be avoided Outcome: Progressing   Problem: Activity: Goal: Risk for activity intolerance will decrease Outcome: Progressing   Problem: Nutrition: Goal: Adequate nutrition will be maintained Outcome: Progressing   Problem: Coping: Goal: Level of anxiety will decrease Outcome: Progressing

## 2022-09-12 NOTE — H&P (Signed)
History and Physical    Patient: Gloria Rose DOB: Dec 17, 1950 DOA: 09/11/2022 DOS: the patient was seen and examined on 09/12/2022 PCP: [name], NP  Patient coming from: Home  Chief Complaint:  Chief Complaint  Patient presents with   Abdominal Pain   HPI: Gloria Rose is a 72 y.o. female with medical history significant of hypertension, GERD, hyperlipidemia, depression who presents to the emergency department due to abdominal pain which started yesterday around 3 PM, this was associated with several episodes of nonbloody vomiting with the last episode being just prior to arrival to the ED.  Abdominal pain was in epigastric area, pain was achy in nature and was rated as 9/10 on pain scale, this was associated with 8 loose bowel movements within a 3-hour period.  ED Course: In the emergency department, vital signs were within normal range on arrival to the ED, however patient became febrile within 1 to 2 hours of being in the ED and was tachypneic and tachycardic.  Workup in the ED showed normal CBC except for WBC of 10.7.  CBC was normal except for potassium of 3.4, blood glucose 175, AST 105 ALT 59 ALP 104, bilirubin 2.1, lactic acid 2.7 > 4.3 > 4.3.  Lipase 35, troponin 4 > 5.  Urinalysis was unimpressive for UTI. SARS coronavirus 2 was negative.  Blood culture was pending. CT chest, abdomen and pelvis with contrast showed cholelithiasis with questionable mild gallbladder wall thickening. Chest x-ray showed right internal jugular central venous catheter with tip overlying the right atrium but poorly visualized due to overlying EKG leads.  Enteric tube in good position.  No acute cardiopulmonary abnormality. Within few hours of being in the ED, patient's BP dropped to hypotensive range with MAP in the low 50s, central line was placed, IV hydration per sepsis protocol was provided, patient was started on aztreonam, metronidazole and vancomycin.  Pain medication was  given. Hospitalist was asked to admit patient for further evaluation and management. At bedside, patient was in no acute distress.  NG tube already drained about 800 mL of brownish fluid.  Review of Systems: Review of systems as noted in the HPI. All other systems reviewed and are negative.   Past Medical History:  Diagnosis Date   Cancer (HCC)    melanoma of toe (big toe on left foot) removed   Depression    Diabetes mellitus without complication (HCC)    dx 7-8 yrs  type 2   GERD (gastroesophageal reflux disease)    Hypertension    Nausea & vomiting 09/12/2022   Past Surgical History:  Procedure Laterality Date   COLONOSCOPY WITH PROPOFOL N/A 10/23/2020   Procedure: COLONOSCOPY WITH PROPOFOL;  Surgeon: Lanelle Bal, DO;  Location: [facility];  Service: Endoscopy;  Laterality: N/A;  10:00 / ASA II   RTR     RIGHT SHOULDER IN 2013   TOTAL KNEE ARTHROPLASTY Left 07/03/2014   Procedure: LEFT TOTAL KNEE ARTHROPLASTY;  Surgeon: Dannielle Huh, MD;  Location: [facility] OR;  Service: Orthopedics;  Laterality: Left;    Social History:  reports that she has never smoked. She has never used smokeless tobacco. She reports current alcohol use of about 2.0 standard drinks of alcohol per week. She reports that she does not use drugs.   Allergies  Allergen Reactions   Penicillins Anaphylaxis and Hives    Difficulty breathing    Family History  Problem Relation Age of Onset   Cancer Mother  lung cancer   Cancer Sister        lung     Prior to Admission medications   Medication Sig Start Date End Date Taking? Authorizing Provider  amLODipine (NORVASC) 5 MG tablet Take 5 mg by mouth at bedtime.    [provider]  aspirin EC 81 MG tablet Take 81 mg by mouth at bedtime.    [provider]  atenolol (TENORMIN) 100 MG tablet Take 100 mg by mouth at bedtime.    [provider]  hydrochlorothiazide (HYDRODIURIL) 25 MG tablet Take 25 mg by mouth at bedtime.     [provider]  lisinopril (PRINIVIL,ZESTRIL) 20 MG tablet Take 20 mg by mouth at bedtime.    [provider]  omeprazole (PRILOSEC) 20 MG capsule Take 20 mg by mouth at bedtime.    [provider]  PARoxetine (PAXIL) 40 MG tablet Take 40 mg by mouth at bedtime.    [provider]  pravastatin (PRAVACHOL) 40 MG tablet Take 40 mg by mouth at bedtime.    [provider]    Physical Exam: BP 111/68 (BP Location: Right Arm)   Pulse 75   Temp 97.8 F (36.6 C) (Oral)   Resp 16   SpO2 97%   General: 72 y.o. year-old female well developed well nourished in no acute distress.  Alert and oriented x3. HEENT: NCAT, EOMI Neck: Supple, trachea medial Cardiovascular: Regular rate and rhythm with no rubs or gallops.  No thyromegaly or JVD noted.  No lower extremity edema. 2/4 pulses in all 4 extremities. Respiratory: Clear to auscultation with no wheezes or rales. Good inspiratory effort. Abdomen: Soft, tender to palpation without guarding.  Nondistended with normal bowel sounds x4 quadrants. Muskuloskeletal: No cyanosis, clubbing or edema noted bilaterally Neuro: CN II-XII intact, strength 5/5 x 4, sensation, reflexes intact Skin: No ulcerative lesions noted or rashes Psychiatry: Judgement and insight appear normal. Mood is appropriate for condition and setting          Labs on Admission:  Basic Metabolic Panel: Recent Labs  Lab 09/11/22 1748  NA 137  K 3.4*  CL 100  CO2 24  GLUCOSE 175*  BUN 19  CREATININE 0.90  CALCIUM 8.8*   Liver Function Tests: Recent Labs  Lab 09/11/22 1748  AST 105*  ALT 59*  ALKPHOS 104  BILITOT 2.1*  PROT 6.7  ALBUMIN 3.7   Recent Labs  Lab 09/11/22 1748  LIPASE 35   No results for input(s): "AMMONIA" in the last 168 hours. CBC: Recent Labs  Lab 09/11/22 1748  WBC 10.7*  HGB 12.9  HCT 39.5  MCV 83.9  PLT 231   Cardiac Enzymes: No results for input(s): "CKTOTAL", "CKMB", "CKMBINDEX",  "TROPONINI" in the last 168 hours.  BNP (last 3 results) No results for input(s): "BNP" in the last 8760 hours.  ProBNP (last 3 results) No results for input(s): "PROBNP" in the last 8760 hours.  CBG: No results for input(s): "GLUCAP" in the last 168 hours.  Radiological Exams on Admission: DG Chest Portable 1 View  Result Date: 09/12/2022 CLINICAL DATA:  line placement central EXAM: PORTABLE CHEST 1 VIEW COMPARISON:  Chest x-ray 09/11/2022 5 p.m., CT chest 09/11/2022 FINDINGS: Right internal jugular central venous catheter with tip overlying the right atrium but poorly visualized due to overlying EKG leads. Enteric tube courses below the hemidiaphragm with tip and side port overlying the expected region of the gastric lumen. The heart and mediastinal contours are within normal  limits. No focal consolidation. No pulmonary edema. No pleural effusion. No pneumothorax. No acute osseous abnormality. IMPRESSION: 1. Right internal jugular central venous catheter with tip overlying the right atrium but poorly visualized due to overlying EKG leads. 2. Enteric tube in good position. 3. No acute cardiopulmonary abnormality. Electronically Signed   By: Tish Frederickson M.D.   On: 09/12/2022 02:10   DG Chest Port 1 View  Result Date: 09/11/2022 CLINICAL DATA:  Nasogastric tube placement EXAM: PORTABLE CHEST 1 VIEW COMPARISON:  Same date chest CT FINDINGS: Tip and side port of the enteric tube below the diaphragm in the stomach. Normal heart size and mediastinal contours. No focal airspace disease, pleural effusion or pneumothorax. Thoracic spondylosis. IMPRESSION: Tip and side port of the enteric tube below the diaphragm in the stomach. Electronically Signed   By: Narda Rutherford M.D.   On: 09/11/2022 22:31   CT CHEST ABDOMEN PELVIS W CONTRAST  Result Date: 09/11/2022 CLINICAL DATA:  Sepsis, general abdominal and back pain, chills, EXAM: CT CHEST, ABDOMEN, AND PELVIS WITH CONTRAST TECHNIQUE: Multidetector CT  imaging of the chest, abdomen and pelvis was performed following the standard protocol during bolus administration of intravenous contrast. RADIATION DOSE REDUCTION: This exam was performed according to the departmental dose-optimization program which includes automated exposure control, adjustment of the mA and/or kV according to patient size and/or use of iterative reconstruction technique. CONTRAST:  OMNIPAQUE IOHEXOL 300 MG/ML  SOLN COMPARISON:  None available FINDINGS: CT CHEST FINDINGS Cardiovascular: Normal heart size. Coronary artery and aortic atherosclerotic calcification. No pericardial effusion. Mediastinum/Nodes: Trachea and esophagus are unremarkable. No thoracic adenopathy. Lungs/Pleura: No focal consolidation, pleural effusion, or pneumothorax. Musculoskeletal: No acute fracture. Cervical spine fusion hardware. Advanced arthritis both shoulders. CT ABDOMEN PELVIS FINDINGS Hepatobiliary: Cholelithiasis. Question mild gallbladder wall thickening. The gallbladder is mildly distended. The gallbladder is mildly distended. No pericholecystic fluid or stranding. No biliary dilation. Unremarkable liver. Pancreas: Unremarkable. Spleen: Unremarkable. Adrenals/Urinary Tract: Normal adrenal glands. No urinary calculi or hydronephrosis. Unremarkable bladder. Stomach/Bowel: Normal caliber large and small bowel. Normal appendix. No bowel wall thickening. Stomach is within normal limits. Vascular/Lymphatic: Aortic atherosclerosis. 1.1 cm enhancing nodule in the porta hepatis (series 2/image 63 and 3/62). Reproductive: Uterus and bilateral adnexa are unremarkable. Other: No free intraperitoneal fluid or air. Musculoskeletal: No acute fracture. IMPRESSION: 1. Cholelithiasis with questionable mild gallbladder wall thickening. If there is clinical concern for acute cholecystitis, consider further evaluation with ultrasound. 2. 1.1 cm enhancing nodule in the porta hepatis. This may represent a lymph node. Short  interval follow-up in 3 months is recommended. Aortic Atherosclerosis (ICD10-I70.0). Electronically Signed   By: Minerva Fester M.D.   On: 09/11/2022 20:27    EKG: I independently viewed the EKG done and my findings are as followed: Normal sinus rhythm at a rate of 89 bpm  Assessment/Plan Present on Admission:  Abdominal pain  Principal Problem:   Septic shock (HCC) Active Problems:   Abdominal pain   Transaminitis   Hypokalemia   Hyperglycemia   Essential hypertension   Mixed hyperlipidemia   GERD without esophagitis   Nausea & vomiting   Lactic acidosis  Septic shock in the setting of possible acute cholecystitis Patient presented with fever, tachypnea (met SIRS criteria) CT abdomen and pelvis was suggestive of cholelithiasis with questionable mild gallbladder wall thickening Patient also presents with lactic acidosis with lactic acid being greater than 4.0 BP did not respond to IV fluid per sepsis protocol and patient requires pressors (Levophed) to maintain normal  blood pressure. Continue vancomycin and aztreonam Continue Tylenol as needed Continue IV hydration Abdominal ultrasound will be done in the morning Consider surgical consult based on ultrasound findings  Abdominal pain, nausea and vomiting in the setting of above Continue IV fentanyl as needed Continue IV Zofran as needed Continue NG tube at this time  Lactic acidosis possibly due to septic shock Lactic acid 2.7 > 4.3 > 4.3 Continue to trend lactic acid  Transaminitis possibly secondary to shock liver AST 105, ALT 59 Continue to monitor liver enzymes with morning labs  Hypokalemia K+ 3.4, this will be replenished  Hyperglycemia CBG 175.  Hemoglobin A1c on 09/11/2022 was 6.3 (Care Everywhere) Continue to monitor her blood glucose levels  Essential hypertension BP meds will be held at this time due to hypotension  Mixed hyperlipidemia Statin will be held at this time due to  transaminitis  GERD Continue Protonix  DVT prophylaxis: Heparin subcu  Advance Care Planning: CODE STATUS: Full code  Consults: None  Family Communication: None at bedside  Severity of Illness: The appropriate patient status for this patient is INPATIENT. Inpatient status is judged to be reasonable and necessary in order to provide the required intensity of service to ensure the patient's safety. The patient's presenting symptoms, physical exam findings, and initial radiographic and laboratory data in the context of their chronic comorbidities is felt to place them at high risk for further clinical deterioration. Furthermore, it is not anticipated that the patient will be medically stable for discharge from the hospital within 2 midnights of admission.   * I certify that at the point of admission it is my clinical judgment that the patient will require inpatient hospital care spanning beyond 2 midnights from the point of admission due to high intensity of service, high risk for further deterioration and high frequency of surveillance required.*  Author: Frankey Shown, DO 09/12/2022 5:45 AM  For on call review [email or web address].

## 2022-09-12 NOTE — Progress Notes (Signed)
Date and time results received: 09/12/22 0740 (use smartphrase ".now" to insert current time)  Test: Blood culture Critical Value: Positive for gram negative rods in anaerobic bottle  Name of Provider Notified: Dr. Marisa Severin  Orders Received? Or Actions Taken?: MD aware, awaiting orders.

## 2022-09-12 NOTE — Consult Note (Signed)
[facility] Consult  Reason for Consult: Concern for cholecystitis Referring Physician: Dr. Doran Durand  Chief Complaint   Abdominal Pain     HPI: Gloria Rose is a 72 y.o. female who presented to the hospital with overall not feeling well, abdominal pain, nausea, and vomiting.  She states that yesterday she just began not feeling well with chills/shakes, nausea, and upper abdominal pain.  The pain radiates into her back.  She denies ever having an episode like this previously.  When she presented to the hospital, she did have an episode of emesis.  She was also noted to be febrile at 102.2.  In the ED, she underwent a CT of the abdomen and pelvis which demonstrated cholelithiasis with questionable mild gallbladder wall thickening, if concern for cholecystitis recommending ultrasound.  She had a mild leukocytosis of 10.7.  She did have a lactic acidosis that increased from 2 to 4.3 at that time.  She also had mild elevation in her AST, ALT, and total bilirubin at 2.1.  She was otherwise hemodynamically stable.  She subsequently continued to have a lactic acidosis and began to become hemodynamically unstable with dropping blood pressures.  A right IJ central venous catheter was placed in the ED, and she was started on Levophed.  This morning, she is currently on 2, with plan to completely wean off.  NG tube was also placed in the emergency department with 900 cc of gastric contents returning.  This morning, she states that she feels much better.  She denies any nausea or vomiting.  She is also denying abdominal pain.  Past Medical History:  Diagnosis Date   Cancer (HCC)    melanoma of toe (big toe on left foot) removed   Depression    Diabetes mellitus without complication (HCC)    dx 7-8 yrs  type 2   GERD (gastroesophageal reflux disease)    Hypertension    Nausea & vomiting 09/12/2022    Past Surgical History:  Procedure Laterality Date   COLONOSCOPY WITH PROPOFOL  N/A 10/23/2020   Procedure: COLONOSCOPY WITH PROPOFOL;  Surgeon: Lanelle Bal, DO;  Location: [facility];  Service: Endoscopy;  Laterality: N/A;  10:00 / ASA II   RTR     RIGHT SHOULDER IN 2013   TOTAL KNEE ARTHROPLASTY Left 07/03/2014   Procedure: LEFT TOTAL KNEE ARTHROPLASTY;  Surgeon: Dannielle Huh, MD;  Location: [facility] OR;  Service: Orthopedics;  Laterality: Left;    Family History  Problem Relation Age of Onset   Cancer Mother        lung cancer   Cancer Sister        lung    Social History   Tobacco Use   Smoking status: Never   Smokeless tobacco: Never  Vaping Use   Vaping status: Never Used  Substance Use Topics   Alcohol use: Yes    Alcohol/week: 2.0 standard drinks of alcohol    Types: 2 Glasses of wine per week   Drug use: No    Medications: I have reviewed the patient's current medications. Prior to Admission:  Medications Prior to Admission  Medication Sig Dispense Refill Last Dose   amLODipine (NORVASC) 5 MG tablet Take 5 mg by mouth at bedtime.   Past Week   aspirin EC 81 MG tablet Take 81 mg by mouth at bedtime.   Past Week   atenolol (TENORMIN) 100 MG tablet Take 100 mg by mouth at bedtime.   09/10/2022   hydrochlorothiazide (HYDRODIURIL)  25 MG tablet Take 25 mg by mouth at bedtime.   Past Week   lisinopril (PRINIVIL,ZESTRIL) 20 MG tablet Take 20 mg by mouth at bedtime.   Past Week   omeprazole (PRILOSEC) 20 MG capsule Take 20 mg by mouth at bedtime.   Past Week   PARoxetine (PAXIL) 40 MG tablet Take 40 mg by mouth at bedtime.   Past Week   pravastatin (PRAVACHOL) 40 MG tablet Take 40 mg by mouth at bedtime.   Past Week   sertraline (ZOLOFT) 50 MG tablet Take 50 mg by mouth daily.   Past Week   Vitamin D, Ergocalciferol, (DRISDOL) 1.25 MG (50000 UNIT) CAPS capsule Take 50,000 Units by mouth every 7 (seven) days.   Past Week   Scheduled:  Chlorhexidine Gluconate Cloth  6 each Topical Daily   heparin  5,000 Units Subcutaneous Q8H   Continuous:  sodium  chloride 250 mL (09/11/22 2348)   sodium chloride 100 mL/hr at 09/12/22 0614   ceFEPime (MAXIPIME) IV 2 g (09/12/22 0936)   metronidazole 500 mg (09/12/22 0940)   norepinephrine (LEVOPHED) Adult infusion 7 mcg/min (09/12/22 0627)   vancomycin     QIH:KVQQVZDG (SUBLIMAZE) injection  Allergies  Allergen Reactions   Penicillins Anaphylaxis and Hives    Difficulty breathing     ROS:  Pertinent items are noted in HPI.  Blood pressure 119/60, pulse 61, temperature 97.9 F (36.6 C), temperature source Oral, resp. rate 14, SpO2 99%. Physical Exam Vitals reviewed.  Constitutional:      Appearance: She is well-developed.  HENT:     Head: Normocephalic and atraumatic.     Comments: NG tube in place with bloody gastric contents in canister Eyes:     Extraocular Movements: Extraocular movements intact.     Pupils: Pupils are equal, round, and reactive to light.  Cardiovascular:     Rate and Rhythm: Normal rate.  Pulmonary:     Effort: Pulmonary effort is normal.  Abdominal:     Comments: Abdomen soft, nondistended, no percussion tenderness, minimal tenderness to palpation in epigastrium; no rigidity, guarding, rebound tenderness; negative Murphy sign  Skin:    General: Skin is warm and dry.  Neurological:     General: No focal deficit present.     Mental Status: She is alert and oriented to person, place, and time.  Psychiatric:        Mood and Affect: Mood normal.        Behavior: Behavior normal.     Results: Results for orders placed or performed during the hospital encounter of 09/11/22 (from the past 48 hour(s))  Lipase, blood     Status: None   Collection Time: 09/11/22  5:48 PM  Result Value Ref Range   Lipase 35 11 - 51 U/L    Comment: Performed at [facility], [address] [place], [place] [address]  Comprehensive metabolic panel     Status: Abnormal   Collection Time: 09/11/22  5:48 PM  Result Value Ref Range   Sodium 137 135 - 145 mmol/L   Potassium 3.4 (L)  3.5 - 5.1 mmol/L   Chloride 100 98 - 111 mmol/L   CO2 24 22 - 32 mmol/L   Glucose, Bld 175 (H) 70 - 99 mg/dL    Comment: Glucose reference range applies only to samples taken after fasting for at least 8 hours.   BUN 19 8 - 23 mg/dL   Creatinine, Ser 4.33 0.44 - 1.00 mg/dL   Calcium 8.8 (L)  8.9 - 10.3 mg/dL   Total Protein 6.7 6.5 - 8.1 g/dL   Albumin 3.7 3.5 - 5.0 g/dL   AST 244 (H) 15 - 41 U/L   ALT 59 (H) 0 - 44 U/L   Alkaline Phosphatase 104 38 - 126 U/L   Total Bilirubin 2.1 (H) 0.3 - 1.2 mg/dL   GFR, Estimated >01 >02 mL/min    Comment: (NOTE) Calculated using the CKD-EPI Creatinine Equation (2021)    Anion gap 13 5 - 15    Comment: Performed at [facility], [address], [place], [place] [address]  CBC     Status: Abnormal   Collection Time: 09/11/22  5:48 PM  Result Value Ref Range   WBC 10.7 (H) 4.0 - 10.5 K/uL   RBC 4.71 3.87 - 5.11 MIL/uL   Hemoglobin 12.9 12.0 - 15.0 g/dL   HCT 64.4 03.4 - 74.2 %   MCV 83.9 80.0 - 100.0 fL   MCH 27.4 26.0 - 34.0 pg   MCHC 32.7 30.0 - 36.0 g/dL   RDW 59.5 63.8 - 75.6 %   Platelets 231 150 - 400 K/uL   nRBC 0.0 0.0 - 0.2 %    Comment: Performed at [facility], [address] [place], [place] [address]  Troponin I (High Sensitivity)     Status: None   Collection Time: 09/11/22  5:48 PM  Result Value Ref Range   Troponin I (High Sensitivity) 4 <18 ng/L    Comment: (NOTE) Elevated high sensitivity troponin I (hsTnI) values and significant  changes across serial measurements may suggest ACS but many other  chronic and acute conditions are known to elevate hsTnI results.  Refer to the "Links" section for chest pain algorithms and additional  guidance. Performed at [facility], [address] [place], [place] [address]   SARS Coronavirus 2 by RT PCR (hospital order, performed in [facility] hospital lab) *cepheid single result test* Anterior Nasal Swab     Status: None   Collection Time: 09/11/22  5:48 PM   Specimen: Anterior  Nasal Swab  Result Value Ref Range   SARS Coronavirus 2 by RT PCR NEGATIVE NEGATIVE    Comment: (NOTE) SARS-CoV-2 target nucleic acids are NOT DETECTED.  The SARS-CoV-2 RNA is generally detectable in upper and lower respiratory specimens during the acute phase of infection. The lowest concentration of SARS-CoV-2 viral copies this assay can detect is 250 copies / mL. A negative result does not preclude SARS-CoV-2 infection and should not be used as the sole basis for treatment or other patient management decisions.  A negative result may occur with improper specimen collection / handling, submission of specimen other than nasopharyngeal swab, presence of viral mutation(s) within the areas targeted by this assay, and inadequate number of viral copies (<250 copies / mL). A negative result must be combined with clinical observations, patient history, and epidemiological information.  Fact Sheet for Patients:   RoadLapTop.co.za  Fact Sheet for Healthcare Providers: http://kim-miller.com/  This test is not yet approved or  cleared by the [place] FDA and has been authorized for detection and/or diagnosis of SARS-CoV-2 by FDA under an Emergency Use Authorization (EUA).  This EUA will remain in effect (meaning this test can be used) for the duration of the COVID-19 declaration under Section 564(b)(1) of the Act, 21 U.S.C. section 360bbb-3(b)(1), unless the authorization is terminated or revoked sooner.  Performed at [facility], [address], [place], [place] [address]   Lactic acid, plasma  Status: Abnormal   Collection Time: 09/11/22  5:48 PM  Result Value Ref Range   Lactic Acid, Venous 2.7 (HH) 0.5 - 1.9 mmol/L    Comment: CRITICAL RESULT CALLED TO, READ BACK BY AND VERIFIED WITH SMALLWOOD,M ON 09/11/22 AT 1825 BY LOY,C Performed at [facility], [address] [place], [place] [address]   Culture, blood (single)     Status:  None (Preliminary result)   Collection Time: 09/11/22  7:27 PM   Specimen: Left Antecubital; Blood  Result Value Ref Range   Specimen Description LEFT ANTECUBITAL    Special Requests      BOTTLES DRAWN AEROBIC AND ANAEROBIC Blood Culture adequate volume   Culture  Setup Time      GRAM NEGATIVE RODS IN BOTH AEROBIC AND ANAEROBIC BOTTLES Gram Stain Report Called to,Read Back By and Verified With: Paulo Fruit @0740  ON [id number] BY HENDERSONL Performed at [facility], [address] [place], [place] [address]    Culture GRAM NEGATIVE RODS    Report Status PENDING   Troponin I (High Sensitivity)     Status: None   Collection Time: 09/11/22  7:27 PM  Result Value Ref Range   Troponin I (High Sensitivity) 5 <18 ng/L    Comment: (NOTE) Elevated high sensitivity troponin I (hsTnI) values and significant  changes across serial measurements may suggest ACS but many other  chronic and acute conditions are known to elevate hsTnI results.  Refer to the "Links" section for chest pain algorithms and additional  guidance. Performed at [facility], [address], [place], [place] [address]   Lactic acid, plasma     Status: Abnormal   Collection Time: 09/11/22  8:09 PM  Result Value Ref Range   Lactic Acid, Venous 4.3 (HH) 0.5 - 1.9 mmol/L    Comment: CRITICAL RESULT CALLED TO, READ BACK BY AND VERIFIED WITH MUSE,J ON 09/11/22 AT 2115 BY LOY,C Performed at [facility], [address] [place], [place] [address]   Blood culture (routine x 2)     Status: None (Preliminary result)   Collection Time: 09/11/22  8:47 PM   Specimen: BLOOD  Result Value Ref Range   Specimen Description BLOOD BLOOD LEFT HAND    Special Requests      BOTTLES DRAWN AEROBIC AND ANAEROBIC Blood Culture adequate volume   Culture      NO GROWTH < 12 HOURS Performed at [facility], [address] [place], [place] [address]    Report Status PENDING   Urinalysis, Routine w reflex microscopic -Urine, Clean Catch     Status:  Abnormal   Collection Time: 09/11/22  9:45 PM  Result Value Ref Range   Color, Urine YELLOW YELLOW   APPearance HAZY (A) CLEAR   Specific Gravity, Urine 1.038 (H) 1.005 - 1.030   pH 5.0 5.0 - 8.0   Glucose, UA NEGATIVE NEGATIVE mg/dL   Hgb urine dipstick SMALL (A) NEGATIVE   Bilirubin Urine NEGATIVE NEGATIVE   Ketones, ur NEGATIVE NEGATIVE mg/dL   Protein, ur NEGATIVE NEGATIVE mg/dL   Nitrite NEGATIVE NEGATIVE   Leukocytes,Ua TRACE (A) NEGATIVE   RBC / HPF 0-5 0 - 5 RBC/hpf   WBC, UA 0-5 0 - 5 WBC/hpf   Bacteria, UA NONE SEEN NONE SEEN   Squamous Epithelial / HPF 0-5 0 - 5 /HPF    Comment: Performed at [facility], [address], [place], [place] [address]  Lactic acid, plasma     Status: Abnormal   Collection Time:  09/11/22 10:21 PM  Result Value Ref Range   Lactic Acid, Venous 4.3 (HH) 0.5 - 1.9 mmol/L    Comment: CRITICAL VALUE NOTED.  VALUE IS CONSISTENT WITH PREVIOUSLY REPORTED AND CALLED VALUE. Performed at [facility], [address] [place], [place] [address]   Lactic acid, plasma     Status: Abnormal   Collection Time: 09/11/22 11:46 PM  Result Value Ref Range   Lactic Acid, Venous 4.3 (HH) 0.5 - 1.9 mmol/L    Comment: CRITICAL RESULT CALLED TO, READ BACK BY AND VERIFIED WITH Laban Emperor., RN 09/12/22 Neta Mends, J Performed at [facility], [address] [place], [place] [address]   CBC     Status: Abnormal   Collection Time: 09/12/22  6:03 AM  Result Value Ref Range   WBC 27.3 (H) 4.0 - 10.5 K/uL   RBC 4.10 3.87 - 5.11 MIL/uL   Hemoglobin 11.3 (L) 12.0 - 15.0 g/dL   HCT 29.5 (L) 18.8 - 41.6 %   MCV 82.4 80.0 - 100.0 fL   MCH 27.6 26.0 - 34.0 pg   MCHC 33.4 30.0 - 36.0 g/dL   RDW 60.6 30.1 - 60.1 %   Platelets 222 150 - 400 K/uL   nRBC 0.0 0.0 - 0.2 %    Comment: Performed at [facility], [address], [place], [place] [address]  Lactic acid, plasma     Status: Abnormal   Collection Time: 09/12/22  6:03 AM  Result Value Ref Range   Lactic Acid, Venous 2.0  (HH) 0.5 - 1.9 mmol/L    Comment: CRITICAL VALUE NOTED. VALUE IS CONSISTENT WITH PREVIOUSLY REPORTED/CALLED VALUE Performed at [facility], [address] [place], [place] [address]   Comprehensive metabolic panel     Status: Abnormal   Collection Time: 09/12/22  6:03 AM  Result Value Ref Range   Sodium 134 (L) 135 - 145 mmol/L   Potassium 3.5 3.5 - 5.1 mmol/L   Chloride 100 98 - 111 mmol/L   CO2 24 22 - 32 mmol/L   Glucose, Bld 246 (H) 70 - 99 mg/dL    Comment: Glucose reference range applies only to samples taken after fasting for at least 8 hours.   BUN 22 8 - 23 mg/dL   Creatinine, Ser 2.02 (H) 0.44 - 1.00 mg/dL   Calcium 8.4 (L) 8.9 - 10.3 mg/dL   Total Protein 5.7 (L) 6.5 - 8.1 g/dL   Albumin 2.8 (L) 3.5 - 5.0 g/dL   AST 542 (H) 15 - 41 U/L   ALT 111 (H) 0 - 44 U/L   Alkaline Phosphatase 100 38 - 126 U/L   Total Bilirubin 3.4 (H) 0.3 - 1.2 mg/dL   GFR, Estimated 48 (L) >60 mL/min    Comment: (NOTE) Calculated using the CKD-EPI Creatinine Equation (2021)    Anion gap 10 5 - 15    Comment: Performed at [facility], [address] [place], [place] [address]  Magnesium     Status: Abnormal   Collection Time: 09/12/22  6:03 AM  Result Value Ref Range   Magnesium 1.3 (L) 1.7 - 2.4 mg/dL    Comment: Performed at [facility], [address], [place], [place] [address]  Glucose, capillary     Status: Abnormal   Collection Time: 09/12/22  8:23 AM  Result Value Ref Range   Glucose-Capillary 209 (H) 70 - 99 mg/dL    Comment: Glucose reference range applies only to samples taken after fasting for at least 8  hours.  Lactic acid, plasma     Status: None   Collection Time: 09/12/22  8:34 AM  Result Value Ref Range   Lactic Acid, Venous 1.0 0.5 - 1.9 mmol/L    Comment: Performed at [facility], [address], [place], [place] [address]    DG Chest Portable 1 View  Result Date: 09/12/2022 CLINICAL DATA:  line placement central EXAM: PORTABLE CHEST 1 VIEW COMPARISON:  Chest x-ray  09/11/2022 5 p.m., CT chest 09/11/2022 FINDINGS: Right internal jugular central venous catheter with tip overlying the right atrium but poorly visualized due to overlying EKG leads. Enteric tube courses below the hemidiaphragm with tip and side port overlying the expected region of the gastric lumen. The heart and mediastinal contours are within normal limits. No focal consolidation. No pulmonary edema. No pleural effusion. No pneumothorax. No acute osseous abnormality. IMPRESSION: 1. Right internal jugular central venous catheter with tip overlying the right atrium but poorly visualized due to overlying EKG leads. 2. Enteric tube in good position. 3. No acute cardiopulmonary abnormality. Electronically Signed   By: Tish Frederickson M.D.   On: 09/12/2022 02:10   DG Chest Port 1 View  Result Date: 09/11/2022 CLINICAL DATA:  Nasogastric tube placement EXAM: PORTABLE CHEST 1 VIEW COMPARISON:  Same date chest CT FINDINGS: Tip and side port of the enteric tube below the diaphragm in the stomach. Normal heart size and mediastinal contours. No focal airspace disease, pleural effusion or pneumothorax. Thoracic spondylosis. IMPRESSION: Tip and side port of the enteric tube below the diaphragm in the stomach. Electronically Signed   By: Narda Rutherford M.D.   On: 09/11/2022 22:31   CT CHEST ABDOMEN PELVIS W CONTRAST  Result Date: 09/11/2022 CLINICAL DATA:  Sepsis, general abdominal and back pain, chills, EXAM: CT CHEST, ABDOMEN, AND PELVIS WITH CONTRAST TECHNIQUE: Multidetector CT imaging of the chest, abdomen and pelvis was performed following the standard protocol during bolus administration of intravenous contrast. RADIATION DOSE REDUCTION: This exam was performed according to the departmental dose-optimization program which includes automated exposure control, adjustment of the mA and/or kV according to patient size and/or use of iterative reconstruction technique. CONTRAST:  OMNIPAQUE IOHEXOL 300 MG/ML  SOLN  COMPARISON:  None available FINDINGS: CT CHEST FINDINGS Cardiovascular: Normal heart size. Coronary artery and aortic atherosclerotic calcification. No pericardial effusion. Mediastinum/Nodes: Trachea and esophagus are unremarkable. No thoracic adenopathy. Lungs/Pleura: No focal consolidation, pleural effusion, or pneumothorax. Musculoskeletal: No acute fracture. Cervical spine fusion hardware. Advanced arthritis both shoulders. CT ABDOMEN PELVIS FINDINGS Hepatobiliary: Cholelithiasis. Question mild gallbladder wall thickening. The gallbladder is mildly distended. The gallbladder is mildly distended. No pericholecystic fluid or stranding. No biliary dilation. Unremarkable liver. Pancreas: Unremarkable. Spleen: Unremarkable. Adrenals/Urinary Tract: Normal adrenal glands. No urinary calculi or hydronephrosis. Unremarkable bladder. Stomach/Bowel: Normal caliber large and small bowel. Normal appendix. No bowel wall thickening. Stomach is within normal limits. Vascular/Lymphatic: Aortic atherosclerosis. 1.1 cm enhancing nodule in the porta hepatis (series 2/image 63 and 3/62). Reproductive: Uterus and bilateral adnexa are unremarkable. Other: No free intraperitoneal fluid or air. Musculoskeletal: No acute fracture. IMPRESSION: 1. Cholelithiasis with questionable mild gallbladder wall thickening. If there is clinical concern for acute cholecystitis, consider further evaluation with ultrasound. 2. 1.1 cm enhancing nodule in the porta hepatis. This may represent a lymph node. Short interval follow-up in 3 months is recommended. Aortic Atherosclerosis (ICD10-I70.0). Electronically Signed   By: Minerva Fester M.D.   On: 09/11/2022 20:27     Assessment & Plan:  Gloria Rose  is a 72 y.o. female who was admitted with concern for septic shock from possible cholecystitis.  Imaging and blood work evaluated by myself.  -Patient initially with possible concern for acute cholecystitis.  I had recommended ultrasound to ED  physician and admission to the hospitalist service -Patient subsequently worsened with hypotension requiring central venous catheter insertion and Levophed.  This morning her leukocytosis increased to 27.3 from 10.7, LFTs increased with total bili increasing to 3.4 from 2.1. Lactic acidosis has resolved with most recent lactic acid of 1 -Given significant increase in leukocytosis and increase in total bilirubin, I am concerned that the patient might have a cholangitis picture from possible choledocholithiasis -Patient with gram-negative bacteremia on 2 of 2 blood cultures -Recommend MRCP to evaluate for choledocholithiasis -Patient would potentially need ERCP if choledocholithiasis is noted on MRCP -If MRCP does not show choledocholithiasis, will need to assess patient's stability for percutaneous cholecystostomy tube versus cholecystectomy -Continue IV antibiotics -PRN pain control and antiemetics -Will discuss need for NG tube with hospitalist -Appreciate hospitalist recommendations -Further recommendations to follow imaging  All questions were answered to the satisfaction of the patient and family.  -- Theophilus Kinds, DO [facility] [address] [place], [place] [address] [phone] (office)

## 2022-09-13 DIAGNOSIS — A419 Sepsis, unspecified organism: Secondary | ICD-10-CM | POA: Diagnosis not present

## 2022-09-13 DIAGNOSIS — R6521 Severe sepsis with septic shock: Secondary | ICD-10-CM | POA: Diagnosis not present

## 2022-09-13 LAB — COMPREHENSIVE METABOLIC PANEL
ALT: 99 U/L — ABNORMAL HIGH (ref 0–44)
AST: 105 U/L — ABNORMAL HIGH (ref 15–41)
Albumin: 2.5 g/dL — ABNORMAL LOW (ref 3.5–5.0)
Alkaline Phosphatase: 107 U/L (ref 38–126)
Anion gap: 7 (ref 5–15)
BUN: 18 mg/dL (ref 8–23)
CO2: 26 mmol/L (ref 22–32)
Calcium: 8.5 mg/dL — ABNORMAL LOW (ref 8.9–10.3)
Chloride: 103 mmol/L (ref 98–111)
Creatinine, Ser: 0.85 mg/dL (ref 0.44–1.00)
GFR, Estimated: >60 mL/min (ref >60)
Glucose, Bld: 117 mg/dL — ABNORMAL HIGH (ref 70–99)
Potassium: 3.4 mmol/L — ABNORMAL LOW (ref 3.5–5.1)
Sodium: 136 mmol/L (ref 135–145)
Total Bilirubin: 3 mg/dL — ABNORMAL HIGH (ref 0.3–1.2)
Total Protein: 5.2 g/dL — ABNORMAL LOW (ref 6.5–8.1)

## 2022-09-13 LAB — CBC
HCT: 30.5 % — ABNORMAL LOW (ref 36.0–46.0)
Hemoglobin: 9.9 g/dL — ABNORMAL LOW (ref 12.0–15.0)
MCH: 27 pg (ref 26.0–34.0)
MCHC: 32.5 g/dL (ref 30.0–36.0)
MCV: 83.1 fL (ref 80.0–100.0)
Platelets: 178 10*3/uL (ref 150–400)
RBC: 3.67 MIL/uL — ABNORMAL LOW (ref 3.87–5.11)
RDW: 14.1 % (ref 11.5–15.5)
WBC: 9 10*3/uL (ref 4.0–10.5)
nRBC: 0 % (ref 0.0–0.2)

## 2022-09-13 LAB — MRSA NEXT GEN BY PCR, NASAL: MRSA by PCR Next Gen: DETECTED — AB

## 2022-09-13 LAB — GLUCOSE, CAPILLARY
Glucose-Capillary: 104 mg/dL — ABNORMAL HIGH (ref 70–99)
Glucose-Capillary: 104 mg/dL — ABNORMAL HIGH (ref 70–99)
Glucose-Capillary: 110 mg/dL — ABNORMAL HIGH (ref 70–99)
Glucose-Capillary: 91 mg/dL (ref 70–99)
Glucose-Capillary: 97 mg/dL (ref 70–99)
Glucose-Capillary: 98 mg/dL (ref 70–99)

## 2022-09-13 LAB — PHOSPHORUS: Phosphorus: 1.9 mg/dL — ABNORMAL LOW (ref 2.5–4.6)

## 2022-09-13 MED ORDER — POTASSIUM CHLORIDE CRYS ER 20 MEQ PO TBCR
40.0000 meq | EXTENDED_RELEASE_TABLET | ORAL | Status: AC
  Administered 2022-09-13 (×2): 40 meq via ORAL
  Filled 2022-09-13 (×2): qty 2

## 2022-09-13 MED ORDER — ONDANSETRON HCL 4 MG/2ML IJ SOLN
4.0000 mg | Freq: Four times a day (QID) | INTRAMUSCULAR | Status: DC | PRN
  Administered 2022-09-14 (×2): 4 mg via INTRAVENOUS
  Filled 2022-09-13 (×2): qty 2

## 2022-09-13 MED ORDER — MUPIROCIN 2 % EX OINT
TOPICAL_OINTMENT | Freq: Two times a day (BID) | CUTANEOUS | Status: DC
  Administered 2022-09-14: 1 via NASAL
  Filled 2022-09-13: qty 22

## 2022-09-13 MED ORDER — POTASSIUM PHOSPHATES 15 MMOLE/5ML IV SOLN
30.0000 mmol | Freq: Once | INTRAVENOUS | Status: AC
  Administered 2022-09-13: 30 mmol via INTRAVENOUS
  Filled 2022-09-13: qty 10

## 2022-09-13 MED ORDER — TRAZODONE HCL 50 MG PO TABS
100.0000 mg | ORAL_TABLET | Freq: Every day | ORAL | Status: DC
  Administered 2022-09-14 – 2022-09-15 (×2): 100 mg via ORAL
  Filled 2022-09-13 (×4): qty 2

## 2022-09-13 NOTE — Progress Notes (Addendum)
PROGRESS NOTE   Gloria Rose, is a 72 y.o. female, DOB - [birth year], UJW:[id number]  Admit date - 09/11/2022   Admitting Physician Frankey Shown, DO  Outpatient Primary MD for the patient is [name], NP  LOS - 1  Chief Complaint  Patient presents with   Abdominal Pain        Brief Narrative:  Brief Summary 72 y.o. female with medical history significant of hypertension, GERD, hyperlipidemia, depression admitted on 09/12/2022 with sepsis and bacteremia secondary to acute calculus cholecystitis    -Assessment and Plan: 1)Severe Sepsis and Bacteremia with septic shock secondary to acute calculus cholecystitis--- positive blood cultures noted -CT abdomen and pelvis and right upper quadrant ultrasound suggest calculus cholecystitis ---MRCP shows abnormal gallbladder with progressive wall thickening and edema. Gallstones with a stone towards the neck suggestive of acute cholecystitis, no frank choledocholithiasis- -General Surgery consult appreciated tentatively surgery is planned for Monday, 09/15/2022 -Weaned off IV Levophed on 09/12/22 -Lactic acidosis noted -Elevated LFTs noted--- Blood Cx from 09/11/22 with staph epi in anaerobic bottle Blood cx from 09/11/22---E coli and Klebiella oxytoca---sensitivities pending -Stop IV vancomycin as of 09/13/2022 --Continue IV Flagyl and Rocephin pending further culture data -Continue IV fluids   2)Hypokalemia/hypophosphatemia--replace and recheck -  3)GERD--continue Protonix   4)HLD--hold statin due to elevated LFTs in the setting of gallstones/cholecystitis   Status is: Inpatient   Disposition: The patient is from: Home              Anticipated d/c is to: Home              Anticipated d/c date is: > 3 days              Patient currently is not medically stable to d/c. Barriers: Not Clinically Stable-   Code Status :  -  Code Status: Full Code   Family Communication:    NA (patient is alert, awake and coherent)   DVT  Prophylaxis  :   - SCDs heparin injection 5,000 Units Start: 09/12/22 0600 SCDs Start: 09/12/22 0528   Lab Results  Component Value Date   PLT 178 09/13/2022    Inpatient Medications  Scheduled Meds:  Chlorhexidine Gluconate Cloth  6 each Topical Daily   heparin  5,000 Units Subcutaneous Q8H   mupirocin ointment   Nasal BID   traZODone  100 mg Oral QHS   Continuous Infusions:  sodium chloride 250 mL (09/11/22 2348)   cefTRIAXone (ROCEPHIN)  IV 2 g (09/13/22 1020)   metronidazole 500 mg (09/13/22 1121)   norepinephrine (LEVOPHED) Adult infusion Stopped (09/12/22 1123)   potassium PHOSPHATE IVPB (in mmol) 30 mmol (09/13/22 1316)   PRN Meds:.fentaNYL (SUBLIMAZE) injection   Anti-infectives (From admission, onward)    Start     Dose/Rate Route Frequency Ordered Stop   09/13/22 1000  cefTRIAXone (ROCEPHIN) 2 g in sodium chloride 0.9 % 100 mL IVPB        2 g 200 mL/hr over 30 Minutes Intravenous Every 24 hours 09/12/22 1431     09/12/22 2200  vancomycin (VANCOCIN) IVPB 1000 mg/200 mL premix  Status:  Discontinued        1,000 mg 200 mL/hr over 60 Minutes Intravenous Every 24 hours 09/12/22 0751 09/12/22 1431   09/12/22 2200  vancomycin (VANCOCIN) IVPB 1000 mg/200 mL premix  Status:  Discontinued        1,000 mg 200 mL/hr over 60 Minutes Intravenous Every 24 hours 09/12/22 1509 09/13/22 1618   09/12/22 0900  ceFEPIme (MAXIPIME) 2 g in sodium chloride 0.9 % 100 mL IVPB  Status:  Discontinued        2 g 200 mL/hr over 30 Minutes Intravenous 2 times daily 09/12/22 0834 09/12/22 1431   09/12/22 0900  metroNIDAZOLE (FLAGYL) IVPB 500 mg        500 mg 100 mL/hr over 60 Minutes Intravenous 2 times daily 09/12/22 0834     09/12/22 0300  aztreonam (AZACTAM) 2 g in sodium chloride 0.9 % 100 mL IVPB  Status:  Discontinued        2 g 200 mL/hr over 30 Minutes Intravenous Every 8 hours 09/11/22 1851 09/12/22 0834   09/11/22 1900  vancomycin (VANCOREADY) IVPB 1250 mg/250 mL  Status:   Discontinued        1,250 mg 166.7 mL/hr over 90 Minutes Intravenous Every 24 hours 09/11/22 1851 09/12/22 0751   09/11/22 1845  aztreonam (AZACTAM) 2 g in sodium chloride 0.9 % 100 mL IVPB        2 g 200 mL/hr over 30 Minutes Intravenous  Once 09/11/22 1839 09/11/22 2026   09/11/22 1845  metroNIDAZOLE (FLAGYL) IVPB 500 mg        500 mg 100 mL/hr over 60 Minutes Intravenous  Once 09/11/22 1839 09/11/22 2215   09/11/22 1845  vancomycin (VANCOCIN) IVPB 1000 mg/200 mL premix  Status:  Discontinued        1,000 mg 200 mL/hr over 60 Minutes Intravenous  Once 09/11/22 1839 09/11/22 1849      Subjective: Gloria Rose today has no fevers,  No chest pain,   - No emesis -Reports insomnia -Abdominal pain improving   Objective: Vitals:   09/13/22 1600 09/13/22 1607 09/13/22 1700 09/13/22 1739  BP: (!) 164/66  (!) 145/93 (!) 150/88  Pulse: 73 76 83 82  Resp: 15 17 17 18   Temp:  99.6 F (37.6 C)  98.5 F (36.9 C)  TempSrc:  Oral    SpO2: 100% 100% 99% 99%    Intake/Output Summary (Last 24 hours) at 09/13/2022 1754 Last data filed at 09/13/2022 1700 Gross per 24 hour  Intake 1536.8 ml  Output 1650 ml  Net -113.2 ml   Physical Exam  Gen:- Awake Alert,  in no apparent distress  HEENT:- [place].AT, No sclera icterus,  Neck-Supple Neck,No JVD,.right IJ central line in situ Lungs-  CTAB , fair symmetrical air movement CV- S1, S2 normal, regular  Abd-  +ve B.Sounds, Abd Soft, improving abdominal tenderness, no rebound or guarding Extremity/Skin:- No  edema, pedal pulses present  Psych-affect is appropriate, oriented x3 Neuro-no new focal deficits, no tremors  Data Reviewed: I have personally reviewed following labs and imaging studies  CBC: Recent Labs  Lab 09/11/22 1748 09/12/22 0603 09/13/22 0403  WBC 10.7* 27.3* 9.0  HGB 12.9 11.3* 9.9*  HCT 39.5 33.8* 30.5*  MCV 83.9 82.4 83.1  PLT 231 222 178   Basic Metabolic Panel: Recent Labs  Lab 09/11/22 1748 09/12/22 0603  09/13/22 0403  NA 137 134* 136  K 3.4* 3.5 3.4*  CL 100 100 103  CO2 24 24 26   GLUCOSE 175* 246* 117*  BUN 19 22 18   CREATININE 0.90 1.20* 0.85  CALCIUM 8.8* 8.4* 8.5*  MG  --  1.3*  --   PHOS  --   --  1.9*   GFR: Estimated Creatinine Clearance: 61.7 mL/min (by C-G formula based on SCr of 0.85 mg/dL). Liver Function Tests: Recent Labs  Lab 09/11/22 1748 09/12/22 3086  09/13/22 0403  AST 105* 158* 105*  ALT 59* 111* 99*  ALKPHOS 104 100 107  BILITOT 2.1* 3.4* 3.0*  PROT 6.7 5.7* 5.2*  ALBUMIN 3.7 2.8* 2.5*   Recent Results (from the past 240 hour(s))  SARS Coronavirus 2 by RT PCR (hospital order, performed in [facility] hospital lab) *cepheid single result test* Anterior Nasal Swab     Status: None   Collection Time: 09/11/22  5:48 PM   Specimen: Anterior Nasal Swab  Result Value Ref Range Status   SARS Coronavirus 2 by RT PCR NEGATIVE NEGATIVE Final    Comment: (NOTE) SARS-CoV-2 target nucleic acids are NOT DETECTED.  The SARS-CoV-2 RNA is generally detectable in upper and lower respiratory specimens during the acute phase of infection. The lowest concentration of SARS-CoV-2 viral copies this assay can detect is 250 copies / mL. A negative result does not preclude SARS-CoV-2 infection and should not be used as the sole basis for treatment or other patient management decisions.  A negative result may occur with improper specimen collection / handling, submission of specimen other than nasopharyngeal swab, presence of viral mutation(s) within the areas targeted by this assay, and inadequate number of viral copies (<250 copies / mL). A negative result must be combined with clinical observations, patient history, and epidemiological information.  Fact Sheet for Patients:   RoadLapTop.co.za  Fact Sheet for Healthcare Providers: http://kim-miller.com/  This test is not yet approved or  cleared by the [place] FDA  and has been authorized for detection and/or diagnosis of SARS-CoV-2 by FDA under an Emergency Use Authorization (EUA).  This EUA will remain in effect (meaning this test can be used) for the duration of the COVID-19 declaration under Section 564(b)(1) of the Act, 21 U.S.C. section 360bbb-3(b)(1), unless the authorization is terminated or revoked sooner.  Performed at [facility], [address], [place], [place] [address]   Culture, blood (single)     Status: Abnormal (Preliminary result)   Collection Time: 09/11/22  7:27 PM   Specimen: Left Antecubital; Blood  Result Value Ref Range Status   Specimen Description   Final    LEFT ANTECUBITAL Performed at [facility], [address], [place], [place] [address]    Special Requests   Final    BOTTLES DRAWN AEROBIC AND ANAEROBIC Blood Culture adequate volume Performed at [facility], [address], [place], [place] [address]    Culture  Setup Time   Final    GRAM NEGATIVE RODS IN BOTH AEROBIC AND ANAEROBIC BOTTLES Gram Stain Report Called to,Read Back By and Verified With: GEORGE R @0740  ON [id number] BY HENDERSONL CRITICAL RESULT CALLED TO, READ BACK BY AND VERIFIED WITH: Nehemiah Settle Estelle June [id number] AT 1417 BY CM    Culture (A)  Final    ESCHERICHIA COLI KLEBSIELLA OXYTOCA SUSCEPTIBILITIES TO FOLLOW Performed at [facility], [address], [place], [place] [address]    Report Status PENDING  Incomplete  Blood Culture ID Panel (Reflexed)     Status: Abnormal   Collection Time: 09/11/22  7:27 PM  Result Value Ref Range Status   Enterococcus faecalis NOT DETECTED NOT DETECTED Final   Enterococcus Faecium NOT DETECTED NOT DETECTED Final   Listeria monocytogenes NOT DETECTED NOT DETECTED Final   Staphylococcus species NOT DETECTED NOT DETECTED Final   Staphylococcus aureus (BCID) NOT DETECTED NOT DETECTED Final   Staphylococcus epidermidis NOT DETECTED NOT DETECTED Final   Staphylococcus lugdunensis NOT DETECTED NOT DETECTED  Final   Streptococcus  species NOT DETECTED NOT DETECTED Final   Streptococcus agalactiae NOT DETECTED NOT DETECTED Final   Streptococcus pneumoniae NOT DETECTED NOT DETECTED Final   Streptococcus pyogenes NOT DETECTED NOT DETECTED Final   A.calcoaceticus-baumannii NOT DETECTED NOT DETECTED Final   Bacteroides fragilis NOT DETECTED NOT DETECTED Final   Enterobacterales DETECTED (A) NOT DETECTED Final    Comment: CRITICAL RESULT CALLED TO, READ BACK BY AND VERIFIED WITH: Marita Snellen [id number] AT 1417 BY CM    Enterobacter cloacae complex NOT DETECTED NOT DETECTED Final   Escherichia coli DETECTED (A) NOT DETECTED Final    Comment: CRITICAL RESULT CALLED TO, READ BACK BY AND VERIFIED WITH: Marita Snellen [id number] AT 1417 BY CM    Klebsiella aerogenes NOT DETECTED NOT DETECTED Final   Klebsiella oxytoca DETECTED (A) NOT DETECTED Final    Comment: CRITICAL RESULT CALLED TO, READ BACK BY AND VERIFIED WITH: Marita Snellen [id number] AT 1417 BY CM    Klebsiella pneumoniae NOT DETECTED NOT DETECTED Final   Proteus species NOT DETECTED NOT DETECTED Final   Salmonella species NOT DETECTED NOT DETECTED Final   Serratia marcescens NOT DETECTED NOT DETECTED Final   Haemophilus influenzae NOT DETECTED NOT DETECTED Final   Neisseria meningitidis NOT DETECTED NOT DETECTED Final   Pseudomonas aeruginosa NOT DETECTED NOT DETECTED Final   Stenotrophomonas maltophilia NOT DETECTED NOT DETECTED Final   Candida albicans NOT DETECTED NOT DETECTED Final   Candida auris NOT DETECTED NOT DETECTED Final   Candida glabrata NOT DETECTED NOT DETECTED Final   Candida krusei NOT DETECTED NOT DETECTED Final   Candida parapsilosis NOT DETECTED NOT DETECTED Final   Candida tropicalis NOT DETECTED NOT DETECTED Final   Cryptococcus neoformans/gattii NOT DETECTED NOT DETECTED Final   CTX-M ESBL NOT DETECTED NOT DETECTED Final   Carbapenem resistance IMP NOT DETECTED NOT DETECTED Final   Carbapenem resistance KPC  NOT DETECTED NOT DETECTED Final   Carbapenem resistance NDM NOT DETECTED NOT DETECTED Final   Carbapenem resist OXA 48 LIKE NOT DETECTED NOT DETECTED Final   Carbapenem resistance VIM NOT DETECTED NOT DETECTED Final    Comment: Performed at [facility], [address] [place], [place] [address]  Blood culture (routine x 2)     Status: Abnormal (Preliminary result)   Collection Time: 09/11/22  8:47 PM   Specimen: BLOOD  Result Value Ref Range Status   Specimen Description   Final    BLOOD BLOOD LEFT HAND Performed at [facility], [address], [place], [place] [address]    Special Requests   Final    BOTTLES DRAWN AEROBIC AND ANAEROBIC Blood Culture adequate volume Performed at [facility], [address] [place], [place] [address]    Culture  Setup Time   Final    ANAEROBIC BOTTLE ONLY GRAM POSITIVE COCCI Gram Stain Report Called to,Read Back By and Verified With: GEORGE RUSS 09/12/22 1455 NN  CONFIRMED WITH LH CRITICAL RESULT CALLED TO, READ BACK BY AND VERIFIED WITH: RN JESSICA ELLER ON 09/12/22 @ 2259 BY DRT    Culture (A)  Final    STAPHYLOCOCCUS EPIDERMIDIS THE SIGNIFICANCE OF ISOLATING THIS ORGANISM FROM A SINGLE VENIPUNCTURE CANNOT BE PREDICTED WITHOUT FURTHER CLINICAL AND CULTURE CORRELATION. SUSCEPTIBILITIES AVAILABLE ONLY ON REQUEST. Performed at [facility], [address] [place], [place] [address]    Report Status PENDING  Incomplete  Blood Culture ID Panel (Reflexed)     Status: Abnormal   Collection Time: 09/11/22  8:47 PM  Result Value Ref Range Status   Enterococcus faecalis NOT DETECTED NOT DETECTED Final   Enterococcus Faecium NOT DETECTED NOT DETECTED Final   Listeria monocytogenes NOT DETECTED NOT DETECTED Final   Staphylococcus species DETECTED (A) NOT DETECTED Final    Comment: CRITICAL RESULT CALLED TO, READ BACK BY AND VERIFIED WITH: RN JESSICA ELLER ON 09/12/22 @ 2259 BY DRT    Staphylococcus aureus (BCID) NOT DETECTED NOT DETECTED Final    Staphylococcus epidermidis DETECTED (A) NOT DETECTED Final    Comment: CRITICAL RESULT CALLED TO, READ BACK BY AND VERIFIED WITH: RN JESSICA ELLER ON 09/12/22 @ 2259 BY DRT    Staphylococcus lugdunensis NOT DETECTED NOT DETECTED Final   Streptococcus species NOT DETECTED NOT DETECTED Final   Streptococcus agalactiae NOT DETECTED NOT DETECTED Final   Streptococcus pneumoniae NOT DETECTED NOT DETECTED Final   Streptococcus pyogenes NOT DETECTED NOT DETECTED Final   A.calcoaceticus-baumannii NOT DETECTED NOT DETECTED Final   Bacteroides fragilis NOT DETECTED NOT DETECTED Final   Enterobacterales NOT DETECTED NOT DETECTED Final   Enterobacter cloacae complex NOT DETECTED NOT DETECTED Final   Escherichia coli NOT DETECTED NOT DETECTED Final   Klebsiella aerogenes NOT DETECTED NOT DETECTED Final   Klebsiella oxytoca NOT DETECTED NOT DETECTED Final   Klebsiella pneumoniae NOT DETECTED NOT DETECTED Final   Proteus species NOT DETECTED NOT DETECTED Final   Salmonella species NOT DETECTED NOT DETECTED Final   Serratia marcescens NOT DETECTED NOT DETECTED Final   Haemophilus influenzae NOT DETECTED NOT DETECTED Final   Neisseria meningitidis NOT DETECTED NOT DETECTED Final   Pseudomonas aeruginosa NOT DETECTED NOT DETECTED Final   Stenotrophomonas maltophilia NOT DETECTED NOT DETECTED Final   Candida albicans NOT DETECTED NOT DETECTED Final   Candida auris NOT DETECTED NOT DETECTED Final   Candida glabrata NOT DETECTED NOT DETECTED Final   Candida krusei NOT DETECTED NOT DETECTED Final   Candida parapsilosis NOT DETECTED NOT DETECTED Final   Candida tropicalis NOT DETECTED NOT DETECTED Final   Cryptococcus neoformans/gattii NOT DETECTED NOT DETECTED Final   Methicillin resistance mecA/C NOT DETECTED NOT DETECTED Final    Comment: Performed at [facility], [address], [place], [place] [address]  MRSA Next Gen by PCR, Nasal     Status: Abnormal   Collection Time: 09/12/22  5:51 AM    Specimen: Nasal Mucosa; Nasal Swab  Result Value Ref Range Status   MRSA by PCR Next Gen DETECTED (A) NOT DETECTED Final    Comment: RESULT CALLED TO, READ BACK BY AND VERIFIED WITH: HYLTON L @ 0932 ON [id number] BY HENDERSON L (NOTE) The GeneXpert MRSA Assay (FDA approved for NASAL specimens only), is one component of a comprehensive MRSA colonization surveillance program. It is not intended to diagnose MRSA infection nor to guide or monitor treatment for MRSA infections. Test performance is not FDA approved in patients less than 62 years old. Performed at [facility], [address], [place], [place] [address]     Radiology Studies: MR ABDOMEN MRCP W WO CONTAST  Result Date: 09/12/2022 CLINICAL DATA:  Right upper quadrant pain. EXAM: MRI ABDOMEN WITHOUT AND WITH CONTRAST (INCLUDING MRCP) TECHNIQUE: Multiplanar multisequence MR imaging of the abdomen was performed both before and after the administration of intravenous contrast. Heavily T2-weighted images of the biliary and pancreatic ducts were obtained, and three-dimensional MRCP images were rendered by post processing. CONTRAST:  8mL GADAVIST GADOBUTROL 1 MMOL/ML IV SOLN COMPARISON:  Ultrasound 09/12/2022.  CT 09/11/2022 FINDINGS: Lower chest: Trace  bilateral pleural fluid. There is some adjacent signal changes along the lung. Favor atelectasis. Enteric tube in place extending into the stomach. Hepatobiliary: No biliary ductal dilatation. Common duct measures 5 mm. Gallbladder is distended with numerous stones including a stone towards the neck. There is also worsening gallbladder wall thickening and edema. Increasing adjacent ascites. Findings are worrisome for developing acute cholecystitis. No space-occupying liver lesion. Patent portal vein. There is some signal dropout in the liver on out of phase imaging consistent with a component of fatty liver infiltration. Pancreas: No mass, inflammatory changes, or other parenchymal abnormality identified.  Spleen:  Within normal limits in size and appearance. Adrenals/Urinary Tract: Right adrenal gland is preserved. The left is minimally thickened. No enhancing renal mass or collecting system dilatation. Nonspecific perinephric stranding and fluid. Stomach/Bowel: Visualized bowel is nondilated. This includes in the visualized portions of the small and large bowel. There is a distal third portion duodenal diverticulum. Stomach is nondilated. Again enteric tube noted in the stomach. Vascular/Lymphatic: Atherosclerotic changes along the aorta. Normal caliber aorta and IVC. No specific abnormal lymph node enlargement identified in the abdomen but there are some prominent nodes in the porta hepatis and portacaval region. These could be reactive with the adjacent process. Other:  Slight areas of free fluid in the abdomen.  Mild anasarca. Musculoskeletal: Degenerative changes seen along the spine. IMPRESSION: Abnormal gallbladder with progressive wall thickening and edema. Gallstones with a stone towards the neck. Please correlate for acute cholecystitis. No biliary ductal dilatation or clear biliary ductal filling defect. Mild fatty liver infiltration. Tiny pleural effusions with some adjacent atelectasis or infiltrate, new from previous CT Electronically Signed   By: Karen Kays M.D.   On: 09/12/2022 16:17   US Abdomen Complete  Result Date: 09/12/2022 CLINICAL DATA:  Abdominal pain for 2 days with vomiting. EXAM: ABDOMEN ULTRASOUND COMPLETE COMPARISON:  CT scan of September 11, 2022. FINDINGS: Gallbladder: Cholelithiasis is noted. No sonographic Murphy's sign is noted. Largest calculus measures 1.5 cm. Moderate to severe gallbladder wall thickening is noted at 8 mm. Common bile duct: Diameter: 4 mm which is within normal limits. Liver: No focal lesion identified. Within normal limits in parenchymal echogenicity. Portal vein is patent on color Doppler imaging with normal direction of blood flow towards the liver. IVC:  No abnormality visualized. Pancreas: Not visualized due to overlying bowel gas. Spleen: Size and appearance within normal limits. Right Kidney: Length: 10 cm. Echogenicity within normal limits. No mass or hydronephrosis visualized. Left Kidney: Length: 9 cm. Echogenicity within normal limits. No mass or hydronephrosis visualized. Abdominal aorta: No aneurysm visualized. Other findings: None. IMPRESSION: Cholelithiasis with moderate to severe gallbladder wall thickening concerning for acute cholecystitis. No sonographic Murphy's sign is noted. HIDA scan may be performed for further evaluation. Electronically Signed   By: Lupita Raider M.D.   On: 09/12/2022 11:21   DG Chest Portable 1 View  Result Date: 09/12/2022 CLINICAL DATA:  line placement central EXAM: PORTABLE CHEST 1 VIEW COMPARISON:  Chest x-ray 09/11/2022 5 p.m., CT chest 09/11/2022 FINDINGS: Right internal jugular central venous catheter with tip overlying the right atrium but poorly visualized due to overlying EKG leads. Enteric tube courses below the hemidiaphragm with tip and side port overlying the expected region of the gastric lumen. The heart and mediastinal contours are within normal limits. No focal consolidation. No pulmonary edema. No pleural effusion. No pneumothorax. No acute osseous abnormality. IMPRESSION: 1. Right internal jugular central venous catheter with tip overlying the right atrium  but poorly visualized due to overlying EKG leads. 2. Enteric tube in good position. 3. No acute cardiopulmonary abnormality. Electronically Signed   By: Tish Frederickson M.D.   On: 09/12/2022 02:10   DG Chest Port 1 View  Result Date: 09/11/2022 CLINICAL DATA:  Nasogastric tube placement EXAM: PORTABLE CHEST 1 VIEW COMPARISON:  Same date chest CT FINDINGS: Tip and side port of the enteric tube below the diaphragm in the stomach. Normal heart size and mediastinal contours. No focal airspace disease, pleural effusion or pneumothorax. Thoracic  spondylosis. IMPRESSION: Tip and side port of the enteric tube below the diaphragm in the stomach. Electronically Signed   By: Narda Rutherford M.D.   On: 09/11/2022 22:31   CT CHEST ABDOMEN PELVIS W CONTRAST  Result Date: 09/11/2022 CLINICAL DATA:  Sepsis, general abdominal and back pain, chills, EXAM: CT CHEST, ABDOMEN, AND PELVIS WITH CONTRAST TECHNIQUE: Multidetector CT imaging of the chest, abdomen and pelvis was performed following the standard protocol during bolus administration of intravenous contrast. RADIATION DOSE REDUCTION: This exam was performed according to the departmental dose-optimization program which includes automated exposure control, adjustment of the mA and/or kV according to patient size and/or use of iterative reconstruction technique. CONTRAST:  OMNIPAQUE IOHEXOL 300 MG/ML  SOLN COMPARISON:  None available FINDINGS: CT CHEST FINDINGS Cardiovascular: Normal heart size. Coronary artery and aortic atherosclerotic calcification. No pericardial effusion. Mediastinum/Nodes: Trachea and esophagus are unremarkable. No thoracic adenopathy. Lungs/Pleura: No focal consolidation, pleural effusion, or pneumothorax. Musculoskeletal: No acute fracture. Cervical spine fusion hardware. Advanced arthritis both shoulders. CT ABDOMEN PELVIS FINDINGS Hepatobiliary: Cholelithiasis. Question mild gallbladder wall thickening. The gallbladder is mildly distended. The gallbladder is mildly distended. No pericholecystic fluid or stranding. No biliary dilation. Unremarkable liver. Pancreas: Unremarkable. Spleen: Unremarkable. Adrenals/Urinary Tract: Normal adrenal glands. No urinary calculi or hydronephrosis. Unremarkable bladder. Stomach/Bowel: Normal caliber large and small bowel. Normal appendix. No bowel wall thickening. Stomach is within normal limits. Vascular/Lymphatic: Aortic atherosclerosis. 1.1 cm enhancing nodule in the porta hepatis (series 2/image 63 and 3/62). Reproductive: Uterus and  bilateral adnexa are unremarkable. Other: No free intraperitoneal fluid or air. Musculoskeletal: No acute fracture. IMPRESSION: 1. Cholelithiasis with questionable mild gallbladder wall thickening. If there is clinical concern for acute cholecystitis, consider further evaluation with ultrasound. 2. 1.1 cm enhancing nodule in the porta hepatis. This may represent a lymph node. Short interval follow-up in 3 months is recommended. Aortic Atherosclerosis (ICD10-I70.0). Electronically Signed   By: Minerva Fester M.D.   On: 09/11/2022 20:27    Scheduled Meds:  Chlorhexidine Gluconate Cloth  6 each Topical Daily   heparin  5,000 Units Subcutaneous Q8H   mupirocin ointment   Nasal BID   traZODone  100 mg Oral [facility]   Continuous Infusions:  sodium chloride 250 mL (09/11/22 2348)   cefTRIAXone (ROCEPHIN)  IV 2 g (09/13/22 1020)   metronidazole 500 mg (09/13/22 1121)   norepinephrine (LEVOPHED) Adult infusion Stopped (09/12/22 1123)   potassium PHOSPHATE IVPB (in mmol) 30 mmol (09/13/22 1316)    LOS: 1 day   Shon Hale M.D on 09/13/2022 at 5:54 PM  Go to [email or web address] - for contact info  [facility] - Office  [phone]  If 7PM-7AM, please contact night-coverage [email or web address] 09/13/2022, 5:54 PM

## 2022-09-13 NOTE — Progress Notes (Signed)
[facility] Progress Note     Subjective: Patient seen and examined.  She is resting comfortably in bed.  She is tolerating her clears without nausea and vomiting.  She currently denies any abdominal pain.  She feels tired, but better than she did when she first came to the hospital.  Objective: Vital signs in last 24 hours: Temp:  [98 F (36.7 C)-98.9 F (37.2 C)] 98.6 F (37 C) (09/07 1100) Pulse Rate:  [61-80] 80 (09/07 0500) Resp:  [11-21] 14 (09/07 0500) BP: (92-136)/(15-99) 115/52 (09/07 0500) SpO2:  [96 %-100 %] 98 % (09/07 0500) Last BM Date : 09/12/22  Intake/Output from previous day: 09/06 0701 - 09/07 0700 In: 1079.7 [I.V.:395.6; IV Piggyback:684] Out: 800 [Urine:800] Intake/Output this shift: Total I/O In: -  Out: 700 [Urine:700]  General appearance: alert, cooperative, and no distress GI: Abdomen soft, nondistended, no percussion tenderness, nontender to palpation; no rigidity, guarding, rebound tenderness; negative Murphy sign  Lab Results:  Recent Labs    09/12/22 0603 09/13/22 0403  WBC 27.3* 9.0  HGB 11.3* 9.9*  HCT 33.8* 30.5*  PLT 222 178   BMET Recent Labs    09/12/22 0603 09/13/22 0403  NA 134* 136  K 3.5 3.4*  CL 100 103  CO2 24 26  GLUCOSE 246* 117*  BUN 22 18  CREATININE 1.20* 0.85  CALCIUM 8.4* 8.5*   PT/INR No results for input(s): "LABPROT", "INR" in the last 72 hours.  Studies/Results: MR ABDOMEN MRCP W WO CONTAST  Result Date: 09/12/2022 CLINICAL DATA:  Right upper quadrant pain. EXAM: MRI ABDOMEN WITHOUT AND WITH CONTRAST (INCLUDING MRCP) TECHNIQUE: Multiplanar multisequence MR imaging of the abdomen was performed both before and after the administration of intravenous contrast. Heavily T2-weighted images of the biliary and pancreatic ducts were obtained, and three-dimensional MRCP images were rendered by post processing. CONTRAST:  8mL GADAVIST GADOBUTROL 1 MMOL/ML IV SOLN COMPARISON:  Ultrasound 09/12/2022.   CT 09/11/2022 FINDINGS: Lower chest: Trace bilateral pleural fluid. There is some adjacent signal changes along the lung. Favor atelectasis. Enteric tube in place extending into the stomach. Hepatobiliary: No biliary ductal dilatation. Common duct measures 5 mm. Gallbladder is distended with numerous stones including a stone towards the neck. There is also worsening gallbladder wall thickening and edema. Increasing adjacent ascites. Findings are worrisome for developing acute cholecystitis. No space-occupying liver lesion. Patent portal vein. There is some signal dropout in the liver on out of phase imaging consistent with a component of fatty liver infiltration. Pancreas: No mass, inflammatory changes, or other parenchymal abnormality identified. Spleen:  Within normal limits in size and appearance. Adrenals/Urinary Tract: Right adrenal gland is preserved. The left is minimally thickened. No enhancing renal mass or collecting system dilatation. Nonspecific perinephric stranding and fluid. Stomach/Bowel: Visualized bowel is nondilated. This includes in the visualized portions of the small and large bowel. There is a distal third portion duodenal diverticulum. Stomach is nondilated. Again enteric tube noted in the stomach. Vascular/Lymphatic: Atherosclerotic changes along the aorta. Normal caliber aorta and IVC. No specific abnormal lymph node enlargement identified in the abdomen but there are some prominent nodes in the porta hepatis and portacaval region. These could be reactive with the adjacent process. Other:  Slight areas of free fluid in the abdomen.  Mild anasarca. Musculoskeletal: Degenerative changes seen along the spine. IMPRESSION: Abnormal gallbladder with progressive wall thickening and edema. Gallstones with a stone towards the neck. Please correlate for acute cholecystitis. No biliary ductal dilatation or clear biliary ductal  filling defect. Mild fatty liver infiltration. Tiny pleural effusions with  some adjacent atelectasis or infiltrate, new from previous CT Electronically Signed   By: Karen Kays M.D.   On: 09/12/2022 16:17   US Abdomen Complete  Result Date: 09/12/2022 CLINICAL DATA:  Abdominal pain for 2 days with vomiting. EXAM: ABDOMEN ULTRASOUND COMPLETE COMPARISON:  CT scan of September 11, 2022. FINDINGS: Gallbladder: Cholelithiasis is noted. No sonographic Murphy's sign is noted. Largest calculus measures 1.5 cm. Moderate to severe gallbladder wall thickening is noted at 8 mm. Common bile duct: Diameter: 4 mm which is within normal limits. Liver: No focal lesion identified. Within normal limits in parenchymal echogenicity. Portal vein is patent on color Doppler imaging with normal direction of blood flow towards the liver. IVC: No abnormality visualized. Pancreas: Not visualized due to overlying bowel gas. Spleen: Size and appearance within normal limits. Right Kidney: Length: 10 cm. Echogenicity within normal limits. No mass or hydronephrosis visualized. Left Kidney: Length: 9 cm. Echogenicity within normal limits. No mass or hydronephrosis visualized. Abdominal aorta: No aneurysm visualized. Other findings: None. IMPRESSION: Cholelithiasis with moderate to severe gallbladder wall thickening concerning for acute cholecystitis. No sonographic Murphy's sign is noted. HIDA scan may be performed for further evaluation. Electronically Signed   By: Lupita Raider M.D.   On: 09/12/2022 11:21   DG Chest Portable 1 View  Result Date: 09/12/2022 CLINICAL DATA:  line placement central EXAM: PORTABLE CHEST 1 VIEW COMPARISON:  Chest x-ray 09/11/2022 5 p.m., CT chest 09/11/2022 FINDINGS: Right internal jugular central venous catheter with tip overlying the right atrium but poorly visualized due to overlying EKG leads. Enteric tube courses below the hemidiaphragm with tip and side port overlying the expected region of the gastric lumen. The heart and mediastinal contours are within normal limits. No focal  consolidation. No pulmonary edema. No pleural effusion. No pneumothorax. No acute osseous abnormality. IMPRESSION: 1. Right internal jugular central venous catheter with tip overlying the right atrium but poorly visualized due to overlying EKG leads. 2. Enteric tube in good position. 3. No acute cardiopulmonary abnormality. Electronically Signed   By: Tish Frederickson M.D.   On: 09/12/2022 02:10   DG Chest Port 1 View  Result Date: 09/11/2022 CLINICAL DATA:  Nasogastric tube placement EXAM: PORTABLE CHEST 1 VIEW COMPARISON:  Same date chest CT FINDINGS: Tip and side port of the enteric tube below the diaphragm in the stomach. Normal heart size and mediastinal contours. No focal airspace disease, pleural effusion or pneumothorax. Thoracic spondylosis. IMPRESSION: Tip and side port of the enteric tube below the diaphragm in the stomach. Electronically Signed   By: Narda Rutherford M.D.   On: 09/11/2022 22:31   CT CHEST ABDOMEN PELVIS W CONTRAST  Result Date: 09/11/2022 CLINICAL DATA:  Sepsis, general abdominal and back pain, chills, EXAM: CT CHEST, ABDOMEN, AND PELVIS WITH CONTRAST TECHNIQUE: Multidetector CT imaging of the chest, abdomen and pelvis was performed following the standard protocol during bolus administration of intravenous contrast. RADIATION DOSE REDUCTION: This exam was performed according to the departmental dose-optimization program which includes automated exposure control, adjustment of the mA and/or kV according to patient size and/or use of iterative reconstruction technique. CONTRAST:  OMNIPAQUE IOHEXOL 300 MG/ML  SOLN COMPARISON:  None available FINDINGS: CT CHEST FINDINGS Cardiovascular: Normal heart size. Coronary artery and aortic atherosclerotic calcification. No pericardial effusion. Mediastinum/Nodes: Trachea and esophagus are unremarkable. No thoracic adenopathy. Lungs/Pleura: No focal consolidation, pleural effusion, or pneumothorax. Musculoskeletal: No acute fracture.  Cervical spine  fusion hardware. Advanced arthritis both shoulders. CT ABDOMEN PELVIS FINDINGS Hepatobiliary: Cholelithiasis. Question mild gallbladder wall thickening. The gallbladder is mildly distended. The gallbladder is mildly distended. No pericholecystic fluid or stranding. No biliary dilation. Unremarkable liver. Pancreas: Unremarkable. Spleen: Unremarkable. Adrenals/Urinary Tract: Normal adrenal glands. No urinary calculi or hydronephrosis. Unremarkable bladder. Stomach/Bowel: Normal caliber large and small bowel. Normal appendix. No bowel wall thickening. Stomach is within normal limits. Vascular/Lymphatic: Aortic atherosclerosis. 1.1 cm enhancing nodule in the porta hepatis (series 2/image 63 and 3/62). Reproductive: Uterus and bilateral adnexa are unremarkable. Other: No free intraperitoneal fluid or air. Musculoskeletal: No acute fracture. IMPRESSION: 1. Cholelithiasis with questionable mild gallbladder wall thickening. If there is clinical concern for acute cholecystitis, consider further evaluation with ultrasound. 2. 1.1 cm enhancing nodule in the porta hepatis. This may represent a lymph node. Short interval follow-up in 3 months is recommended. Aortic Atherosclerosis (ICD10-I70.0). Electronically Signed   By: Minerva Fester M.D.   On: 09/11/2022 20:27    Anti-infectives: Anti-infectives (From admission, onward)    Start     Dose/Rate Route Frequency Ordered Stop   09/13/22 1000  cefTRIAXone (ROCEPHIN) 2 g in sodium chloride 0.9 % 100 mL IVPB        2 g 200 mL/hr over 30 Minutes Intravenous Every 24 hours 09/12/22 1431     09/12/22 2200  vancomycin (VANCOCIN) IVPB 1000 mg/200 mL premix  Status:  Discontinued        1,000 mg 200 mL/hr over 60 Minutes Intravenous Every 24 hours 09/12/22 0751 09/12/22 1431   09/12/22 2200  vancomycin (VANCOCIN) IVPB 1000 mg/200 mL premix        1,000 mg 200 mL/hr over 60 Minutes Intravenous Every 24 hours 09/12/22 1509     09/12/22 0900  ceFEPIme  (MAXIPIME) 2 g in sodium chloride 0.9 % 100 mL IVPB  Status:  Discontinued        2 g 200 mL/hr over 30 Minutes Intravenous 2 times daily 09/12/22 0834 09/12/22 1431   09/12/22 0900  metroNIDAZOLE (FLAGYL) IVPB 500 mg        500 mg 100 mL/hr over 60 Minutes Intravenous 2 times daily 09/12/22 0834     09/12/22 0300  aztreonam (AZACTAM) 2 g in sodium chloride 0.9 % 100 mL IVPB  Status:  Discontinued        2 g 200 mL/hr over 30 Minutes Intravenous Every 8 hours 09/11/22 1851 09/12/22 0834   09/11/22 1900  vancomycin (VANCOREADY) IVPB 1250 mg/250 mL  Status:  Discontinued        1,250 mg 166.7 mL/hr over 90 Minutes Intravenous Every 24 hours 09/11/22 1851 09/12/22 0751   09/11/22 1845  aztreonam (AZACTAM) 2 g in sodium chloride 0.9 % 100 mL IVPB        2 g 200 mL/hr over 30 Minutes Intravenous  Once 09/11/22 1839 09/11/22 2026   09/11/22 1845  metroNIDAZOLE (FLAGYL) IVPB 500 mg        500 mg 100 mL/hr over 60 Minutes Intravenous  Once 09/11/22 1839 09/11/22 2215   09/11/22 1845  vancomycin (VANCOCIN) IVPB 1000 mg/200 mL premix  Status:  Discontinued        1,000 mg 200 mL/hr over 60 Minutes Intravenous  Once 09/11/22 1839 09/11/22 1849       Assessment/Plan:  Patient is a 72 year old female who was admitted with concern for septic stop from possible cholecystitis.    -She underwent abdominal ultrasound yesterday which demonstrated gallbladder wall thickening in addition  to cholelithiasis without positive Murphy sign.  She also underwent MRCP which demonstrated again cholelithiasis with gallbladder wall thickening and edema, concerning for possible cholecystitis.  There was no evidence of choledocholithiasis -LFTs downtrending, total bili 3 from 3.4 yesterday -Leukocytosis has resolved today, 9 from 27.3 -Would continue IV antibiotics, especially given gram-negative bacteremia -Patient tolerated clear liquids.  Okay for low-fat diet over the weekend -Discussed with patient imaging  results, and the need for cholecystectomy.  Given her elevated LFTs, we will plan for cholecystectomy on Monday -PRN pain control and antiemetics -IV fluids per primary team -Appreciate hospitalist recommendations   LOS: 1 day    Exie Chrismer A Brieann Osinski 09/13/2022

## 2022-09-14 DIAGNOSIS — A419 Sepsis, unspecified organism: Secondary | ICD-10-CM | POA: Diagnosis not present

## 2022-09-14 DIAGNOSIS — R6521 Severe sepsis with septic shock: Secondary | ICD-10-CM | POA: Diagnosis not present

## 2022-09-14 DIAGNOSIS — R7401 Elevation of levels of liver transaminase levels: Secondary | ICD-10-CM | POA: Diagnosis not present

## 2022-09-14 DIAGNOSIS — K8000 Calculus of gallbladder with acute cholecystitis without obstruction: Secondary | ICD-10-CM | POA: Diagnosis not present

## 2022-09-14 DIAGNOSIS — R1013 Epigastric pain: Secondary | ICD-10-CM | POA: Diagnosis not present

## 2022-09-14 LAB — COMPREHENSIVE METABOLIC PANEL
ALT: 76 U/L — ABNORMAL HIGH (ref 0–44)
AST: 61 U/L — ABNORMAL HIGH (ref 15–41)
Albumin: 2.5 g/dL — ABNORMAL LOW (ref 3.5–5.0)
Alkaline Phosphatase: 126 U/L (ref 38–126)
Anion gap: 6 (ref 5–15)
BUN: 13 mg/dL (ref 8–23)
CO2: 26 mmol/L (ref 22–32)
Calcium: 8.9 mg/dL (ref 8.9–10.3)
Chloride: 103 mmol/L (ref 98–111)
Creatinine, Ser: 0.8 mg/dL (ref 0.44–1.00)
GFR, Estimated: >60 mL/min (ref >60)
Glucose, Bld: 100 mg/dL — ABNORMAL HIGH (ref 70–99)
Potassium: 4.3 mmol/L (ref 3.5–5.1)
Sodium: 135 mmol/L (ref 135–145)
Total Bilirubin: 1.7 mg/dL — ABNORMAL HIGH (ref 0.3–1.2)
Total Protein: 5.3 g/dL — ABNORMAL LOW (ref 6.5–8.1)

## 2022-09-14 LAB — CULTURE, BLOOD (SINGLE): Special Requests: ADEQUATE

## 2022-09-14 LAB — CULTURE, BLOOD (ROUTINE X 2): Special Requests: ADEQUATE

## 2022-09-14 LAB — GLUCOSE, CAPILLARY
Glucose-Capillary: 105 mg/dL — ABNORMAL HIGH (ref 70–99)
Glucose-Capillary: 108 mg/dL — ABNORMAL HIGH (ref 70–99)
Glucose-Capillary: 161 mg/dL — ABNORMAL HIGH (ref 70–99)
Glucose-Capillary: 105 mg/dL — ABNORMAL HIGH (ref 70–99)
Glucose-Capillary: 168 mg/dL — ABNORMAL HIGH (ref 70–99)

## 2022-09-14 MED ORDER — ACETAMINOPHEN 325 MG PO TABS
650.0000 mg | ORAL_TABLET | Freq: Four times a day (QID) | ORAL | Status: DC | PRN
  Administered 2022-09-14: 650 mg via ORAL
  Filled 2022-09-14: qty 2

## 2022-09-14 MED ORDER — INDOCYANINE GREEN 25 MG IV SOLR
2.5000 mg | Freq: Once | INTRAVENOUS | Status: AC
  Administered 2022-09-15: 2.5 mg via INTRAVENOUS
  Filled 2022-09-14: qty 10

## 2022-09-14 MED ORDER — CHLORHEXIDINE GLUCONATE CLOTH 2 % EX PADS
6.0000 | MEDICATED_PAD | Freq: Once | CUTANEOUS | Status: AC
  Administered 2022-09-15: 6 via TOPICAL

## 2022-09-14 MED ORDER — CHLORHEXIDINE GLUCONATE CLOTH 2 % EX PADS
6.0000 | MEDICATED_PAD | Freq: Once | CUTANEOUS | Status: AC
  Administered 2022-09-14: 6 via TOPICAL

## 2022-09-14 NOTE — Progress Notes (Signed)
[facility] Progress Note     Subjective: Patient seen and examined.  She is resting comfortably in bed.  Last night after eating a few bites of a [place] sandwich, she did have some nausea with dry heaving.  This morning she was able to tolerate [place] toast without any nausea or vomiting.  She currently denies any abdominal pain.  She feels much better than when she initially presented to the hospital.  Objective: Vital signs in last 24 hours: Temp:  [98.2 F (36.8 C)-100.8 F (38.2 C)] 98.2 F (36.8 C) (09/08 0337) Pulse Rate:  [72-83] 72 (09/08 0337) Resp:  [14-20] 19 (09/08 0337) BP: (114-164)/(58-93) 114/60 (09/08 0337) SpO2:  [96 %-100 %] 96 % (09/08 0337) Last BM Date : 09/12/22  Intake/Output from previous day: 09/07 0701 - 09/08 0700 In: 1236.8 [P.O.:720; IV Piggyback:516.8] Out: 2300 [Urine:2300] Intake/Output this shift: No intake/output data recorded.  General appearance: alert, cooperative, and no distress GI: Abdomen soft, nondistended, no percussion tenderness, nontender to palpation; no rigidity, guarding, rebound tenderness  Lab Results:  Recent Labs    09/12/22 0603 09/13/22 0403  WBC 27.3* 9.0  HGB 11.3* 9.9*  HCT 33.8* 30.5*  PLT 222 178   BMET Recent Labs    09/13/22 0403 09/14/22 0440  NA 136 135  K 3.4* 4.3  CL 103 103  CO2 26 26  GLUCOSE 117* 100*  BUN 18 13  CREATININE 0.85 0.80  CALCIUM 8.5* 8.9   PT/INR No results for input(s): "LABPROT", "INR" in the last 72 hours.  Studies/Results: MR ABDOMEN MRCP W WO CONTAST  Result Date: 09/12/2022 CLINICAL DATA:  Right upper quadrant pain. EXAM: MRI ABDOMEN WITHOUT AND WITH CONTRAST (INCLUDING MRCP) TECHNIQUE: Multiplanar multisequence MR imaging of the abdomen was performed both before and after the administration of intravenous contrast. Heavily T2-weighted images of the biliary and pancreatic ducts were obtained, and three-dimensional MRCP images were rendered by post  processing. CONTRAST:  8mL GADAVIST GADOBUTROL 1 MMOL/ML IV SOLN COMPARISON:  Ultrasound 09/12/2022.  CT 09/11/2022 FINDINGS: Lower chest: Trace bilateral pleural fluid. There is some adjacent signal changes along the lung. Favor atelectasis. Enteric tube in place extending into the stomach. Hepatobiliary: No biliary ductal dilatation. Common duct measures 5 mm. Gallbladder is distended with numerous stones including a stone towards the neck. There is also worsening gallbladder wall thickening and edema. Increasing adjacent ascites. Findings are worrisome for developing acute cholecystitis. No space-occupying liver lesion. Patent portal vein. There is some signal dropout in the liver on out of phase imaging consistent with a component of fatty liver infiltration. Pancreas: No mass, inflammatory changes, or other parenchymal abnormality identified. Spleen:  Within normal limits in size and appearance. Adrenals/Urinary Tract: Right adrenal gland is preserved. The left is minimally thickened. No enhancing renal mass or collecting system dilatation. Nonspecific perinephric stranding and fluid. Stomach/Bowel: Visualized bowel is nondilated. This includes in the visualized portions of the small and large bowel. There is a distal third portion duodenal diverticulum. Stomach is nondilated. Again enteric tube noted in the stomach. Vascular/Lymphatic: Atherosclerotic changes along the aorta. Normal caliber aorta and IVC. No specific abnormal lymph node enlargement identified in the abdomen but there are some prominent nodes in the porta hepatis and portacaval region. These could be reactive with the adjacent process. Other:  Slight areas of free fluid in the abdomen.  Mild anasarca. Musculoskeletal: Degenerative changes seen along the spine. IMPRESSION: Abnormal gallbladder with progressive wall thickening and edema. Gallstones with a stone towards  the neck. Please correlate for acute cholecystitis. No biliary ductal  dilatation or clear biliary ductal filling defect. Mild fatty liver infiltration. Tiny pleural effusions with some adjacent atelectasis or infiltrate, new from previous CT Electronically Signed   By: Karen Kays M.D.   On: 09/12/2022 16:17   US Abdomen Complete  Result Date: 09/12/2022 CLINICAL DATA:  Abdominal pain for 2 days with vomiting. EXAM: ABDOMEN ULTRASOUND COMPLETE COMPARISON:  CT scan of September 11, 2022. FINDINGS: Gallbladder: Cholelithiasis is noted. No sonographic Murphy's sign is noted. Largest calculus measures 1.5 cm. Moderate to severe gallbladder wall thickening is noted at 8 mm. Common bile duct: Diameter: 4 mm which is within normal limits. Liver: No focal lesion identified. Within normal limits in parenchymal echogenicity. Portal vein is patent on color Doppler imaging with normal direction of blood flow towards the liver. IVC: No abnormality visualized. Pancreas: Not visualized due to overlying bowel gas. Spleen: Size and appearance within normal limits. Right Kidney: Length: 10 cm. Echogenicity within normal limits. No mass or hydronephrosis visualized. Left Kidney: Length: 9 cm. Echogenicity within normal limits. No mass or hydronephrosis visualized. Abdominal aorta: No aneurysm visualized. Other findings: None. IMPRESSION: Cholelithiasis with moderate to severe gallbladder wall thickening concerning for acute cholecystitis. No sonographic Murphy's sign is noted. HIDA scan may be performed for further evaluation. Electronically Signed   By: Lupita Raider M.D.   On: 09/12/2022 11:21    Anti-infectives: Anti-infectives (From admission, onward)    Start     Dose/Rate Route Frequency Ordered Stop   09/13/22 1000  cefTRIAXone (ROCEPHIN) 2 g in sodium chloride 0.9 % 100 mL IVPB        2 g 200 mL/hr over 30 Minutes Intravenous Every 24 hours 09/12/22 1431     09/12/22 2200  vancomycin (VANCOCIN) IVPB 1000 mg/200 mL premix  Status:  Discontinued        1,000 mg 200 mL/hr over 60  Minutes Intravenous Every 24 hours 09/12/22 0751 09/12/22 1431   09/12/22 2200  vancomycin (VANCOCIN) IVPB 1000 mg/200 mL premix  Status:  Discontinued        1,000 mg 200 mL/hr over 60 Minutes Intravenous Every 24 hours 09/12/22 1509 09/13/22 1618   09/12/22 0900  ceFEPIme (MAXIPIME) 2 g in sodium chloride 0.9 % 100 mL IVPB  Status:  Discontinued        2 g 200 mL/hr over 30 Minutes Intravenous 2 times daily 09/12/22 0834 09/12/22 1431   09/12/22 0900  metroNIDAZOLE (FLAGYL) IVPB 500 mg        500 mg 100 mL/hr over 60 Minutes Intravenous 2 times daily 09/12/22 0834     09/12/22 0300  aztreonam (AZACTAM) 2 g in sodium chloride 0.9 % 100 mL IVPB  Status:  Discontinued        2 g 200 mL/hr over 30 Minutes Intravenous Every 8 hours 09/11/22 1851 09/12/22 0834   09/11/22 1900  vancomycin (VANCOREADY) IVPB 1250 mg/250 mL  Status:  Discontinued        1,250 mg 166.7 mL/hr over 90 Minutes Intravenous Every 24 hours 09/11/22 1851 09/12/22 0751   09/11/22 1845  aztreonam (AZACTAM) 2 g in sodium chloride 0.9 % 100 mL IVPB        2 g 200 mL/hr over 30 Minutes Intravenous  Once 09/11/22 1839 09/11/22 2026   09/11/22 1845  metroNIDAZOLE (FLAGYL) IVPB 500 mg        500 mg 100 mL/hr over 60 Minutes Intravenous  Once  09/11/22 1839 09/11/22 2215   09/11/22 1845  vancomycin (VANCOCIN) IVPB 1000 mg/200 mL premix  Status:  Discontinued        1,000 mg 200 mL/hr over 60 Minutes Intravenous  Once 09/11/22 1839 09/11/22 1849       Assessment/Plan:  Patient is a 72 year old female who was admitted with concern for septic shock from possible cholecystitis.  -Imaging is most consistent with cholecystitis without choledocholithiasis -LFTs continuing to improve with total bilirubin of 1.7 from 3 yesterday -Initial blood cultures with Enterobacteralis, E. coli, and Klebsiella oxytoca -Continue IV antibiotics -Okay to continue low-fat diet today. NPO at midnight -PRN pain control and antiemetics -IV fluids  per primary team -I counseled the patient about the indication, risks and benefits of robotic assisted laparoscopic cholecystectomy.  She understands there is a very small chance for bleeding, infection, injury to normal structures (including common bile duct), conversion to open surgery, persistent symptoms, evolution of postcholecystectomy diarrhea, need for secondary interventions, anesthesia reaction, cardiopulmonary issues and other risks not specifically detailed here. I described the expected recovery, the plan for follow-up and the restrictions during the recovery phase.  All questions were answered. -Appreciate hospitalist recommendations    LOS: 2 days    Gloria Rose A Ritik Stavola 09/14/2022

## 2022-09-14 NOTE — Progress Notes (Signed)
PROGRESS NOTE   Gloria Rose, is a 72 y.o. female, DOB - Mar 02, 1950, XBJ:[id number]  Admit date - 09/11/2022   Admitting Physician Frankey Shown, DO  Outpatient Primary MD for the patient is [name], NP  LOS - 2  Chief Complaint  Patient presents with   Abdominal Pain      Brief Summary 72 y.o. female with medical history significant of hypertension, GERD, hyperlipidemia, depression admitted on 09/12/2022 with sepsis and bacteremia secondary to acute calculus cholecystitis -Tentatively lap chole planned for Monday, 09/15/2022    -Assessment and Plan: 1)Severe Sepsis and Bacteremia with septic shock secondary to acute calculus cholecystitis--- positive blood cultures noted -CT abdomen and pelvis and right upper quadrant ultrasound suggest calculus cholecystitis ---MRCP shows abnormal gallbladder with progressive wall thickening and edema. Gallstones with a stone towards the neck suggestive of acute cholecystitis, no frank choledocholithiasis- -General Surgery consult appreciated tentatively surgery is planned for Monday, 09/15/2022 -Weaned off IV Levophed on 09/12/22 -Lactic acidosis noted -Elevated LFTs noted--- LFTs are trending down Blood Cx from 09/11/22 with staph epi in anaerobic bottle Blood cx from 09/11/22---E coli and Klebiella oxytoca---sensitivities pending -Stop IV vancomycin as of 09/13/2022 --Continue IV Flagyl and Rocephin  --Continue IV fluids   2)Hypokalemia/hypophosphatemia--replace and recheck -  3)GERD--continue Protonix   4)HLD--hold statin due to elevated LFTs in the setting of gallstones/cholecystitis   Status is: Inpatient   Disposition: The patient is from: Home              Anticipated d/c is to: Home              Anticipated d/c date is: 2 days              Patient currently is not medically stable to d/c. Barriers: Not Clinically Stable-   Code Status :  -  Code Status: Full Code   Family Communication:    NA (patient is alert, awake and  coherent)   DVT Prophylaxis  :   - SCDs SCD's Start: 09/14/22 1510 heparin injection 5,000 Units Start: 09/12/22 0600 SCDs Start: 09/12/22 0528   Lab Results  Component Value Date   PLT 178 09/13/2022    Inpatient Medications  Scheduled Meds:  Chlorhexidine Gluconate Cloth  6 each Topical Daily   Chlorhexidine Gluconate Cloth  6 each Topical Once   And   Chlorhexidine Gluconate Cloth  6 each Topical Once   heparin  5,000 Units Subcutaneous Q8H   indocyanine green  2.5 mg Intravenous Once   mupirocin ointment   Nasal BID   traZODone  100 mg Oral QHS   Continuous Infusions:  sodium chloride 250 mL (09/11/22 2348)   cefTRIAXone (ROCEPHIN)  IV Stopped (09/14/22 1005)   metronidazole 100 mL/hr at 09/14/22 1200   PRN Meds:.acetaminophen, fentaNYL (SUBLIMAZE) injection, ondansetron (ZOFRAN) IV   Anti-infectives (From admission, onward)    Start     Dose/Rate Route Frequency Ordered Stop   09/13/22 1000  cefTRIAXone (ROCEPHIN) 2 g in sodium chloride 0.9 % 100 mL IVPB        2 g 200 mL/hr over 30 Minutes Intravenous Every 24 hours 09/12/22 1431     09/12/22 2200  vancomycin (VANCOCIN) IVPB 1000 mg/200 mL premix  Status:  Discontinued        1,000 mg 200 mL/hr over 60 Minutes Intravenous Every 24 hours 09/12/22 0751 09/12/22 1431   09/12/22 2200  vancomycin (VANCOCIN) IVPB 1000 mg/200 mL premix  Status:  Discontinued  1,000 mg 200 mL/hr over 60 Minutes Intravenous Every 24 hours 09/12/22 1509 09/13/22 1618   09/12/22 0900  ceFEPIme (MAXIPIME) 2 g in sodium chloride 0.9 % 100 mL IVPB  Status:  Discontinued        2 g 200 mL/hr over 30 Minutes Intravenous 2 times daily 09/12/22 0834 09/12/22 1431   09/12/22 0900  metroNIDAZOLE (FLAGYL) IVPB 500 mg        500 mg 100 mL/hr over 60 Minutes Intravenous 2 times daily 09/12/22 0834     09/12/22 0300  aztreonam (AZACTAM) 2 g in sodium chloride 0.9 % 100 mL IVPB  Status:  Discontinued        2 g 200 mL/hr over 30 Minutes  Intravenous Every 8 hours 09/11/22 1851 09/12/22 0834   09/11/22 1900  vancomycin (VANCOREADY) IVPB 1250 mg/250 mL  Status:  Discontinued        1,250 mg 166.7 mL/hr over 90 Minutes Intravenous Every 24 hours 09/11/22 1851 09/12/22 0751   09/11/22 1845  aztreonam (AZACTAM) 2 g in sodium chloride 0.9 % 100 mL IVPB        2 g 200 mL/hr over 30 Minutes Intravenous  Once 09/11/22 1839 09/11/22 2026   09/11/22 1845  metroNIDAZOLE (FLAGYL) IVPB 500 mg        500 mg 100 mL/hr over 60 Minutes Intravenous  Once 09/11/22 1839 09/11/22 2215   09/11/22 1845  vancomycin (VANCOCIN) IVPB 1000 mg/200 mL premix  Status:  Discontinued        1,000 mg 200 mL/hr over 60 Minutes Intravenous  Once 09/11/22 1839 09/11/22 1849      Subjective: Gloria Rose today has no fevers,  No chest pain,   - -Resting comfortably -No new concerns   Objective: Vitals:   09/13/22 2345 09/14/22 0337 09/14/22 1330 09/14/22 1455  BP: (!) 149/79 114/60 131/77 138/74  Pulse: 82 72 71 75  Resp: 19 19 16 16   Temp: (!) 100.8 F (38.2 C) 98.2 F (36.8 C) 99.2 F (37.3 C) 99.4 F (37.4 C)  TempSrc: Oral Oral Oral Oral  SpO2: 97% 96% 96% 97%    Intake/Output Summary (Last 24 hours) at 09/14/2022 1848 Last data filed at 09/14/2022 1200 Gross per 24 hour  Intake 784.59 ml  Output 650 ml  Net 134.59 ml   Physical Exam  Gen:- Awake Alert,  in no apparent distress  HEENT:- [place].AT, No sclera icterus,  Neck-Supple Neck,No JVD,.right IJ central line in situ Lungs-  CTAB , fair symmetrical air movement CV- S1, S2 normal, regular  Abd-  +ve B.Sounds, Abd Soft, improving abdominal tenderness, no rebound or guarding Extremity/Skin:- No  edema, pedal pulses present  Psych-affect is appropriate, oriented x3 Neuro-no new focal deficits, no tremors  Data Reviewed: I have personally reviewed following labs and imaging studies  CBC: Recent Labs  Lab 09/11/22 1748 09/12/22 0603 09/13/22 0403  WBC 10.7* 27.3* 9.0  HGB 12.9  11.3* 9.9*  HCT 39.5 33.8* 30.5*  MCV 83.9 82.4 83.1  PLT 231 222 178   Basic Metabolic Panel: Recent Labs  Lab 09/11/22 1748 09/12/22 0603 09/13/22 0403 09/14/22 0440  NA 137 134* 136 135  K 3.4* 3.5 3.4* 4.3  CL 100 100 103 103  CO2 24 24 26 26   GLUCOSE 175* 246* 117* 100*  BUN 19 22 18 13   CREATININE 0.90 1.20* 0.85 0.80  CALCIUM 8.8* 8.4* 8.5* 8.9  MG  --  1.3*  --   --  PHOS  --   --  1.9*  --    GFR: Estimated Creatinine Clearance: 65.5 mL/min (by C-G formula based on SCr of 0.8 mg/dL). Liver Function Tests: Recent Labs  Lab 09/11/22 1748 09/12/22 0603 09/13/22 0403 09/14/22 0440  AST 105* 158* 105* 61*  ALT 59* 111* 99* 76*  ALKPHOS 104 100 107 126  BILITOT 2.1* 3.4* 3.0* 1.7*  PROT 6.7 5.7* 5.2* 5.3*  ALBUMIN 3.7 2.8* 2.5* 2.5*   Recent Results (from the past 240 hour(s))  SARS Coronavirus 2 by RT PCR (hospital order, performed in [facility] hospital lab) *cepheid single result test* Anterior Nasal Swab     Status: None   Collection Time: 09/11/22  5:48 PM   Specimen: Anterior Nasal Swab  Result Value Ref Range Status   SARS Coronavirus 2 by RT PCR NEGATIVE NEGATIVE Final    Comment: (NOTE) SARS-CoV-2 target nucleic acids are NOT DETECTED.  The SARS-CoV-2 RNA is generally detectable in upper and lower respiratory specimens during the acute phase of infection. The lowest concentration of SARS-CoV-2 viral copies this assay can detect is 250 copies / mL. A negative result does not preclude SARS-CoV-2 infection and should not be used as the sole basis for treatment or other patient management decisions.  A negative result may occur with improper specimen collection / handling, submission of specimen other than nasopharyngeal swab, presence of viral mutation(s) within the areas targeted by this assay, and inadequate number of viral copies (<250 copies / mL). A negative result must be combined with clinical observations, patient history, and  epidemiological information.  Fact Sheet for Patients:   RoadLapTop.co.za  Fact Sheet for Healthcare Providers: http://kim-miller.com/  This test is not yet approved or  cleared by the [place] FDA and has been authorized for detection and/or diagnosis of SARS-CoV-2 by FDA under an Emergency Use Authorization (EUA).  This EUA will remain in effect (meaning this test can be used) for the duration of the COVID-19 declaration under Section 564(b)(1) of the Act, 21 U.S.C. section 360bbb-3(b)(1), unless the authorization is terminated or revoked sooner.  Performed at [facility], [address], [place], [place] [address]   Culture, blood (single)     Status: Abnormal   Collection Time: 09/11/22  7:27 PM   Specimen: Left Antecubital; Blood  Result Value Ref Range Status   Specimen Description   Final    LEFT ANTECUBITAL Performed at [facility], [address] [place], [place] [address]    Special Requests   Final    BOTTLES DRAWN AEROBIC AND ANAEROBIC Blood Culture adequate volume Performed at [facility], [address], [place], [place] [address]    Culture  Setup Time   Final    GRAM NEGATIVE RODS IN BOTH AEROBIC AND ANAEROBIC BOTTLES Gram Stain Report Called to,Read Back By and Verified With: GEORGE R @0740  ON [id number] BY HENDERSONL CRITICAL RESULT CALLED TO, READ BACK BY AND VERIFIED WITH: Barbaraann Barthel [id number] AT 1417 BY CM Performed at [facility], [address] [place], [place] [address]    Culture ESCHERICHIA COLI KLEBSIELLA OXYTOCA  (A)  Final   Report Status 09/14/2022 FINAL  Final   Organism ID, Bacteria ESCHERICHIA COLI  Final   Organism ID, Bacteria KLEBSIELLA OXYTOCA  Final      Susceptibility   Escherichia coli - MIC*    AMPICILLIN 8 SENSITIVE Sensitive     CEFEPIME <=0.12 SENSITIVE Sensitive     CEFTAZIDIME <=1 SENSITIVE Sensitive  CEFTRIAXONE <=0.25 SENSITIVE Sensitive     CIPROFLOXACIN  <=0.25 SENSITIVE Sensitive     GENTAMICIN <=1 SENSITIVE Sensitive     IMIPENEM <=0.25 SENSITIVE Sensitive     TRIMETH/SULFA <=20 SENSITIVE Sensitive     AMPICILLIN/SULBACTAM 4 SENSITIVE Sensitive     PIP/TAZO <=4 SENSITIVE Sensitive     * ESCHERICHIA COLI   Klebsiella oxytoca - MIC*    AMPICILLIN >=32 RESISTANT Resistant     CEFEPIME <=0.12 SENSITIVE Sensitive     CEFTAZIDIME <=1 SENSITIVE Sensitive     CEFTRIAXONE <=0.25 SENSITIVE Sensitive     CIPROFLOXACIN <=0.25 SENSITIVE Sensitive     GENTAMICIN <=1 SENSITIVE Sensitive     IMIPENEM <=0.25 SENSITIVE Sensitive     TRIMETH/SULFA <=20 SENSITIVE Sensitive     AMPICILLIN/SULBACTAM 16 INTERMEDIATE Intermediate     PIP/TAZO <=4 SENSITIVE Sensitive     * KLEBSIELLA OXYTOCA  Blood Culture ID Panel (Reflexed)     Status: Abnormal   Collection Time: 09/11/22  7:27 PM  Result Value Ref Range Status   Enterococcus faecalis NOT DETECTED NOT DETECTED Final   Enterococcus Faecium NOT DETECTED NOT DETECTED Final   Listeria monocytogenes NOT DETECTED NOT DETECTED Final   Staphylococcus species NOT DETECTED NOT DETECTED Final   Staphylococcus aureus (BCID) NOT DETECTED NOT DETECTED Final   Staphylococcus epidermidis NOT DETECTED NOT DETECTED Final   Staphylococcus lugdunensis NOT DETECTED NOT DETECTED Final   Streptococcus species NOT DETECTED NOT DETECTED Final   Streptococcus agalactiae NOT DETECTED NOT DETECTED Final   Streptococcus pneumoniae NOT DETECTED NOT DETECTED Final   Streptococcus pyogenes NOT DETECTED NOT DETECTED Final   A.calcoaceticus-baumannii NOT DETECTED NOT DETECTED Final   Bacteroides fragilis NOT DETECTED NOT DETECTED Final   Enterobacterales DETECTED (A) NOT DETECTED Final    Comment: CRITICAL RESULT CALLED TO, READ BACK BY AND VERIFIED WITH: Marita Snellen [id number] AT 1417 BY CM    Enterobacter cloacae complex NOT DETECTED NOT DETECTED Final   Escherichia coli DETECTED (A) NOT DETECTED Final    Comment: CRITICAL  RESULT CALLED TO, READ BACK BY AND VERIFIED WITH: Marita Snellen [id number] AT 1417 BY CM    Klebsiella aerogenes NOT DETECTED NOT DETECTED Final   Klebsiella oxytoca DETECTED (A) NOT DETECTED Final    Comment: CRITICAL RESULT CALLED TO, READ BACK BY AND VERIFIED WITH: Marita Snellen [id number] AT 1417 BY CM    Klebsiella pneumoniae NOT DETECTED NOT DETECTED Final   Proteus species NOT DETECTED NOT DETECTED Final   Salmonella species NOT DETECTED NOT DETECTED Final   Serratia marcescens NOT DETECTED NOT DETECTED Final   Haemophilus influenzae NOT DETECTED NOT DETECTED Final   Neisseria meningitidis NOT DETECTED NOT DETECTED Final   Pseudomonas aeruginosa NOT DETECTED NOT DETECTED Final   Stenotrophomonas maltophilia NOT DETECTED NOT DETECTED Final   Candida albicans NOT DETECTED NOT DETECTED Final   Candida auris NOT DETECTED NOT DETECTED Final   Candida glabrata NOT DETECTED NOT DETECTED Final   Candida krusei NOT DETECTED NOT DETECTED Final   Candida parapsilosis NOT DETECTED NOT DETECTED Final   Candida tropicalis NOT DETECTED NOT DETECTED Final   Cryptococcus neoformans/gattii NOT DETECTED NOT DETECTED Final   CTX-M ESBL NOT DETECTED NOT DETECTED Final   Carbapenem resistance IMP NOT DETECTED NOT DETECTED Final   Carbapenem resistance KPC NOT DETECTED NOT DETECTED Final   Carbapenem resistance NDM NOT DETECTED NOT DETECTED Final   Carbapenem resist OXA 48 LIKE NOT DETECTED NOT DETECTED Final   Carbapenem resistance  VIM NOT DETECTED NOT DETECTED Final    Comment: Performed at [facility], [address], [place], [place] [address]  Blood culture (routine x 2)     Status: Abnormal   Collection Time: 09/11/22  8:47 PM   Specimen: BLOOD  Result Value Ref Range Status   Specimen Description   Final    BLOOD BLOOD LEFT HAND Performed at [facility], [address] [place], [place] [address]    Special Requests   Final    BOTTLES DRAWN AEROBIC AND ANAEROBIC Blood Culture  adequate volume Performed at [facility], [address], [place], [place] [address]    Culture  Setup Time   Final    ANAEROBIC BOTTLE ONLY GRAM POSITIVE COCCI Gram Stain Report Called to,Read Back By and Verified With: GEORGE RUSS 09/12/22 1455 NN  CONFIRMED WITH LH CRITICAL RESULT CALLED TO, READ BACK BY AND VERIFIED WITH: RN JESSICA ELLER ON 09/12/22 @ 2259 BY DRT    Culture (A)  Final    STAPHYLOCOCCUS EPIDERMIDIS THE SIGNIFICANCE OF ISOLATING THIS ORGANISM FROM A SINGLE SET OF BLOOD CULTURES WHEN MULTIPLE SETS ARE DRAWN IS UNCERTAIN. PLEASE NOTIFY THE MICROBIOLOGY DEPARTMENT WITHIN ONE WEEK IF SPECIATION AND SENSITIVITIES ARE REQUIRED. Performed at [facility], [address] [place], [place] [address]    Report Status 09/14/2022 FINAL  Final  Blood Culture ID Panel (Reflexed)     Status: Abnormal   Collection Time: 09/11/22  8:47 PM  Result Value Ref Range Status   Enterococcus faecalis NOT DETECTED NOT DETECTED Final   Enterococcus Faecium NOT DETECTED NOT DETECTED Final   Listeria monocytogenes NOT DETECTED NOT DETECTED Final   Staphylococcus species DETECTED (A) NOT DETECTED Final    Comment: CRITICAL RESULT CALLED TO, READ BACK BY AND VERIFIED WITH: RN JESSICA ELLER ON 09/12/22 @ 2259 BY DRT    Staphylococcus aureus (BCID) NOT DETECTED NOT DETECTED Final   Staphylococcus epidermidis DETECTED (A) NOT DETECTED Final    Comment: CRITICAL RESULT CALLED TO, READ BACK BY AND VERIFIED WITH: RN JESSICA ELLER ON 09/12/22 @ 2259 BY DRT    Staphylococcus lugdunensis NOT DETECTED NOT DETECTED Final   Streptococcus species NOT DETECTED NOT DETECTED Final   Streptococcus agalactiae NOT DETECTED NOT DETECTED Final   Streptococcus pneumoniae NOT DETECTED NOT DETECTED Final   Streptococcus pyogenes NOT DETECTED NOT DETECTED Final   A.calcoaceticus-baumannii NOT DETECTED NOT DETECTED Final   Bacteroides fragilis NOT DETECTED NOT DETECTED Final   Enterobacterales NOT DETECTED NOT  DETECTED Final   Enterobacter cloacae complex NOT DETECTED NOT DETECTED Final   Escherichia coli NOT DETECTED NOT DETECTED Final   Klebsiella aerogenes NOT DETECTED NOT DETECTED Final   Klebsiella oxytoca NOT DETECTED NOT DETECTED Final   Klebsiella pneumoniae NOT DETECTED NOT DETECTED Final   Proteus species NOT DETECTED NOT DETECTED Final   Salmonella species NOT DETECTED NOT DETECTED Final   Serratia marcescens NOT DETECTED NOT DETECTED Final   Haemophilus influenzae NOT DETECTED NOT DETECTED Final   Neisseria meningitidis NOT DETECTED NOT DETECTED Final   Pseudomonas aeruginosa NOT DETECTED NOT DETECTED Final   Stenotrophomonas maltophilia NOT DETECTED NOT DETECTED Final   Candida albicans NOT DETECTED NOT DETECTED Final   Candida auris NOT DETECTED NOT DETECTED Final   Candida glabrata NOT DETECTED NOT DETECTED Final   Candida krusei NOT DETECTED NOT DETECTED Final   Candida parapsilosis NOT DETECTED NOT DETECTED Final   Candida tropicalis NOT DETECTED NOT DETECTED Final  Cryptococcus neoformans/gattii NOT DETECTED NOT DETECTED Final   Methicillin resistance mecA/C NOT DETECTED NOT DETECTED Final    Comment: Performed at [facility], [address], [place], [place] [address]  MRSA Next Gen by PCR, Nasal     Status: Abnormal   Collection Time: 09/12/22  5:51 AM   Specimen: Nasal Mucosa; Nasal Swab  Result Value Ref Range Status   MRSA by PCR Next Gen DETECTED (A) NOT DETECTED Final    Comment: RESULT CALLED TO, READ BACK BY AND VERIFIED WITH: HYLTON L @ 0932 ON [id number] BY HENDERSON L (NOTE) The GeneXpert MRSA Assay (FDA approved for NASAL specimens only), is one component of a comprehensive MRSA colonization surveillance program. It is not intended to diagnose MRSA infection nor to guide or monitor treatment for MRSA infections. Test performance is not FDA approved in patients less than 28 years old. Performed at [facility], [address], [place], [place] [address]      Radiology Studies: No results found.  Scheduled Meds:  Chlorhexidine Gluconate Cloth  6 each Topical Daily   Chlorhexidine Gluconate Cloth  6 each Topical Once   And   Chlorhexidine Gluconate Cloth  6 each Topical Once   heparin  5,000 Units Subcutaneous Q8H   indocyanine green  2.5 mg Intravenous Once   mupirocin ointment   Nasal BID   traZODone  100 mg Oral QHS   Continuous Infusions:  sodium chloride 250 mL (09/11/22 2348)   cefTRIAXone (ROCEPHIN)  IV Stopped (09/14/22 1005)   metronidazole 100 mL/hr at 09/14/22 1200    LOS: 2 days   Shon Hale M.D on 09/14/2022 at 6:48 PM  Go to [email or web address] - for contact info  [facility] - Office  [phone]  If 7PM-7AM, please contact night-coverage [email or web address] 09/14/2022, 6:48 PM

## 2022-09-15 ENCOUNTER — Encounter (HOSPITAL_COMMUNITY)
Admission: EM | Disposition: A | Discharge status: Home / Self Care | Payer: Self-pay | Source: Home / Self Care | Attending: Family Medicine

## 2022-09-15 ENCOUNTER — Inpatient Hospital Stay (HOSPITAL_COMMUNITY): Payer: Medicare HMO | Admitting: Anesthesiology

## 2022-09-15 ENCOUNTER — Encounter (HOSPITAL_COMMUNITY): Payer: Self-pay | Admitting: Internal Medicine

## 2022-09-15 ENCOUNTER — Other Ambulatory Visit: Payer: Self-pay

## 2022-09-15 DIAGNOSIS — R1013 Epigastric pain: Secondary | ICD-10-CM | POA: Diagnosis not present

## 2022-09-15 DIAGNOSIS — R7401 Elevation of levels of liver transaminase levels: Secondary | ICD-10-CM | POA: Diagnosis not present

## 2022-09-15 DIAGNOSIS — K810 Acute cholecystitis: Secondary | ICD-10-CM

## 2022-09-15 DIAGNOSIS — K8000 Calculus of gallbladder with acute cholecystitis without obstruction: Secondary | ICD-10-CM | POA: Diagnosis not present

## 2022-09-15 DIAGNOSIS — R6521 Severe sepsis with septic shock: Secondary | ICD-10-CM | POA: Diagnosis not present

## 2022-09-15 DIAGNOSIS — A419 Sepsis, unspecified organism: Secondary | ICD-10-CM | POA: Diagnosis not present

## 2022-09-15 LAB — CBC
HCT: 33.4 % — ABNORMAL LOW (ref 36.0–46.0)
Hemoglobin: 10.9 g/dL — ABNORMAL LOW (ref 12.0–15.0)
MCH: 26.9 pg (ref 26.0–34.0)
MCHC: 32.6 g/dL (ref 30.0–36.0)
MCV: 82.5 fL (ref 80.0–100.0)
Platelets: 166 10*3/uL (ref 150–400)
RBC: 4.05 MIL/uL (ref 3.87–5.11)
RDW: 14 % (ref 11.5–15.5)
WBC: 4.2 10*3/uL (ref 4.0–10.5)
nRBC: 0 % (ref 0.0–0.2)

## 2022-09-15 LAB — COMPREHENSIVE METABOLIC PANEL
ALT: 66 U/L — ABNORMAL HIGH (ref 0–44)
AST: 45 U/L — ABNORMAL HIGH (ref 15–41)
Albumin: 2.7 g/dL — ABNORMAL LOW (ref 3.5–5.0)
Alkaline Phosphatase: 161 U/L — ABNORMAL HIGH (ref 38–126)
Anion gap: 8 (ref 5–15)
BUN: 11 mg/dL (ref 8–23)
CO2: 25 mmol/L (ref 22–32)
Calcium: 9.6 mg/dL (ref 8.9–10.3)
Chloride: 103 mmol/L (ref 98–111)
Creatinine, Ser: 0.74 mg/dL (ref 0.44–1.00)
GFR, Estimated: >60 mL/min (ref >60)
Glucose, Bld: 113 mg/dL — ABNORMAL HIGH (ref 70–99)
Potassium: 3.9 mmol/L (ref 3.5–5.1)
Sodium: 136 mmol/L (ref 135–145)
Total Bilirubin: 1.4 mg/dL — ABNORMAL HIGH (ref 0.3–1.2)
Total Protein: 5.8 g/dL — ABNORMAL LOW (ref 6.5–8.1)

## 2022-09-15 LAB — GLUCOSE, CAPILLARY
Glucose-Capillary: 110 mg/dL — ABNORMAL HIGH (ref 70–99)
Glucose-Capillary: 110 mg/dL — ABNORMAL HIGH (ref 70–99)
Glucose-Capillary: 152 mg/dL — ABNORMAL HIGH (ref 70–99)
Glucose-Capillary: 156 mg/dL — ABNORMAL HIGH (ref 70–99)
Glucose-Capillary: 191 mg/dL — ABNORMAL HIGH (ref 70–99)

## 2022-09-15 SURGERY — CHOLECYSTECTOMY, ROBOT-ASSISTED, LAPAROSCOPIC
Anesthesia: General | Site: Abdomen

## 2022-09-15 MED ORDER — OXYCODONE HCL 5 MG/5ML PO SOLN: 5.0000 mg | Freq: Once | ORAL | Status: DC | PRN

## 2022-09-15 MED ORDER — SUGAMMADEX SODIUM 200 MG/2ML IV SOLN
INTRAVENOUS | Status: DC | PRN
  Administered 2022-09-15: 200 mg via INTRAVENOUS

## 2022-09-15 MED ORDER — ROCURONIUM BROMIDE 10 MG/ML (PF) SYRINGE
PREFILLED_SYRINGE | INTRAVENOUS | Status: DC | PRN
  Administered 2022-09-15: 50 mg via INTRAVENOUS

## 2022-09-15 MED ORDER — OXYCODONE HCL 5 MG PO TABS
5.0000 mg | ORAL_TABLET | Freq: Four times a day (QID) | ORAL | 0 refills | Status: DC | PRN
Start: 2022-09-15 — End: 2022-10-01

## 2022-09-15 MED ORDER — SCOPOLAMINE 1 MG/3DAYS TD PT72
MEDICATED_PATCH | TRANSDERMAL | Status: AC
  Filled 2022-09-15: qty 1

## 2022-09-15 MED ORDER — PHENYLEPHRINE HCL-NACL 20-0.9 MG/250ML-% IV SOLN
INTRAVENOUS | Status: DC | PRN
  Administered 2022-09-15: 50 ug/min via INTRAVENOUS

## 2022-09-15 MED ORDER — ONDANSETRON HCL 4 MG PO TABS: 4.0000 mg | ORAL_TABLET | Freq: Every day | ORAL | 1 refills | Status: DC | PRN

## 2022-09-15 MED ORDER — BUPIVACAINE HCL (PF) 0.5 % IJ SOLN
INTRAMUSCULAR | Status: AC
  Filled 2022-09-15: qty 30

## 2022-09-15 MED ORDER — ACETAMINOPHEN 500 MG PO TABS
1000.0000 mg | ORAL_TABLET | Freq: Four times a day (QID) | ORAL | Status: DC
  Administered 2022-09-15 – 2022-09-16 (×3): 1000 mg via ORAL
  Filled 2022-09-15 (×4): qty 2

## 2022-09-15 MED ORDER — ONDANSETRON HCL 4 MG/2ML IJ SOLN: 4.0000 mg | Freq: Once | INTRAMUSCULAR | Status: DC | PRN

## 2022-09-15 MED ORDER — ROCURONIUM BROMIDE 10 MG/ML (PF) SYRINGE
PREFILLED_SYRINGE | INTRAVENOUS | Status: AC
  Filled 2022-09-15: qty 10

## 2022-09-15 MED ORDER — ACETAMINOPHEN 500 MG PO TABS: 1000.0000 mg | ORAL_TABLET | Freq: Four times a day (QID) | ORAL | 0 refills | Status: AC

## 2022-09-15 MED ORDER — ONDANSETRON HCL 4 MG/2ML IJ SOLN
INTRAMUSCULAR | Status: DC | PRN
  Administered 2022-09-15: 4 mg via INTRAVENOUS

## 2022-09-15 MED ORDER — HEMOSTATIC AGENTS (NO CHARGE) OPTIME
TOPICAL | Status: DC | PRN
  Administered 2022-09-15 (×2): 1 via TOPICAL

## 2022-09-15 MED ORDER — MIDAZOLAM HCL 2 MG/2ML IJ SOLN
INTRAMUSCULAR | Status: DC | PRN
  Administered 2022-09-15: 2 mg via INTRAVENOUS

## 2022-09-15 MED ORDER — FENTANYL CITRATE (PF) 250 MCG/5ML IJ SOLN
INTRAMUSCULAR | Status: DC | PRN
  Administered 2022-09-15: 50 ug via INTRAVENOUS
  Administered 2022-09-15: 25 ug via INTRAVENOUS
  Administered 2022-09-15: 100 ug via INTRAVENOUS
  Administered 2022-09-15: 50 ug via INTRAVENOUS

## 2022-09-15 MED ORDER — FENTANYL CITRATE (PF) 250 MCG/5ML IJ SOLN
INTRAMUSCULAR | Status: AC
  Filled 2022-09-15: qty 5

## 2022-09-15 MED ORDER — CHLORHEXIDINE GLUCONATE 0.12 % MT SOLN
15.0000 mL | Freq: Once | OROMUCOSAL | Status: AC
  Administered 2022-09-15: 15 mL via OROMUCOSAL

## 2022-09-15 MED ORDER — EPHEDRINE SULFATE-NACL 50-0.9 MG/10ML-% IV SOSY
PREFILLED_SYRINGE | INTRAVENOUS | Status: DC | PRN
Start: 2022-09-15 — End: 2022-09-15
  Administered 2022-09-15 (×2): 10 mg via INTRAVENOUS

## 2022-09-15 MED ORDER — PROPOFOL 10 MG/ML IV BOLUS
INTRAVENOUS | Status: DC | PRN
  Administered 2022-09-15: 100 mg via INTRAVENOUS

## 2022-09-15 MED ORDER — FENTANYL CITRATE PF 50 MCG/ML IJ SOSY
25.0000 ug | PREFILLED_SYRINGE | INTRAMUSCULAR | Status: DC | PRN
  Administered 2022-09-15 (×2): 25 ug via INTRAVENOUS

## 2022-09-15 MED ORDER — MIDAZOLAM HCL 2 MG/2ML IJ SOLN
INTRAMUSCULAR | Status: AC
  Filled 2022-09-15: qty 2

## 2022-09-15 MED ORDER — LIDOCAINE HCL (CARDIAC) PF 100 MG/5ML IV SOSY
PREFILLED_SYRINGE | INTRAVENOUS | Status: DC | PRN
  Administered 2022-09-15: 80 mg via INTRAVENOUS

## 2022-09-15 MED ORDER — DEXAMETHASONE SODIUM PHOSPHATE 10 MG/ML IJ SOLN
INTRAMUSCULAR | Status: AC
  Filled 2022-09-15: qty 1

## 2022-09-15 MED ORDER — PHENYLEPHRINE HCL (PRESSORS) 10 MG/ML IV SOLN
INTRAVENOUS | Status: DC | PRN
  Administered 2022-09-15: 160 ug via INTRAVENOUS
  Administered 2022-09-15: 80 ug via INTRAVENOUS
  Administered 2022-09-15 (×2): 160 ug via INTRAVENOUS

## 2022-09-15 MED ORDER — PROPOFOL 10 MG/ML IV BOLUS
INTRAVENOUS | Status: AC
  Filled 2022-09-15: qty 20

## 2022-09-15 MED ORDER — INDOCYANINE GREEN 25 MG IV SOLR
INTRAVENOUS | Status: AC
  Filled 2022-09-15: qty 10

## 2022-09-15 MED ORDER — LIDOCAINE HCL (PF) 2 % IJ SOLN
INTRAMUSCULAR | Status: AC
  Filled 2022-09-15: qty 5

## 2022-09-15 MED ORDER — ONDANSETRON HCL 4 MG/2ML IJ SOLN
INTRAMUSCULAR | Status: AC
  Filled 2022-09-15: qty 2

## 2022-09-15 MED ORDER — STERILE WATER FOR IRRIGATION IR SOLN
Status: DC | PRN
  Administered 2022-09-15: 500 mL

## 2022-09-15 MED ORDER — DEXAMETHASONE SODIUM PHOSPHATE 10 MG/ML IJ SOLN
INTRAMUSCULAR | Status: DC | PRN
  Administered 2022-09-15: 10 mg via INTRAVENOUS

## 2022-09-15 MED ORDER — OXYCODONE HCL 5 MG PO TABS
5.0000 mg | ORAL_TABLET | Freq: Four times a day (QID) | ORAL | Status: DC | PRN
  Administered 2022-09-15: 5 mg via ORAL
  Filled 2022-09-15: qty 1

## 2022-09-15 MED ORDER — LACTATED RINGERS IV SOLN: INTRAVENOUS | Status: DC

## 2022-09-15 MED ORDER — KETAMINE HCL 10 MG/ML IJ SOLN
INTRAMUSCULAR | Status: DC | PRN
  Administered 2022-09-15: 50 mg via INTRAVENOUS

## 2022-09-15 MED ORDER — EPHEDRINE 5 MG/ML INJ
INTRAVENOUS | Status: AC
  Filled 2022-09-15: qty 5

## 2022-09-15 MED ORDER — OXYCODONE HCL 5 MG PO TABS: 5.0000 mg | ORAL_TABLET | Freq: Once | ORAL | Status: DC | PRN

## 2022-09-15 MED ORDER — ORAL CARE MOUTH RINSE: 15.0000 mL | Freq: Once | OROMUCOSAL | Status: AC

## 2022-09-15 MED ORDER — MORPHINE SULFATE (PF) 2 MG/ML IV SOLN: 2.0000 mg | INTRAVENOUS | Status: DC | PRN

## 2022-09-15 MED ORDER — DOCUSATE SODIUM 100 MG PO CAPS
100.0000 mg | ORAL_CAPSULE | Freq: Two times a day (BID) | ORAL | 2 refills | Status: DC
Start: 2022-09-15 — End: 2023-02-11

## 2022-09-15 MED ORDER — KETAMINE HCL 50 MG/5ML IJ SOSY
PREFILLED_SYRINGE | INTRAMUSCULAR | Status: AC
  Filled 2022-09-15: qty 5

## 2022-09-15 MED ORDER — SCOPOLAMINE 1 MG/3DAYS TD PT72
1.0000 | MEDICATED_PATCH | Freq: Once | TRANSDERMAL | Status: DC
  Administered 2022-09-15: 1.5 mg via TRANSDERMAL

## 2022-09-15 MED ORDER — BUPIVACAINE HCL (PF) 0.5 % IJ SOLN
INTRAMUSCULAR | Status: DC | PRN
  Administered 2022-09-15: 30 mL

## 2022-09-15 SURGICAL SUPPLY — 46 items
ADH SKN CLS APL DERMABOND .7 (GAUZE/BANDAGES/DRESSINGS) ×1
APL ESCP 73.6OZ SRGCL (TIP) ×1
APL PRP STRL LF DISP 70% ISPRP (MISCELLANEOUS) ×1
BLADE SURG 15 STRL LF DISP TIS (BLADE) ×1 IMPLANT
BLADE SURG 15 STRL SS (BLADE) ×1
CAUTERY HOOK MNPLR 1.6 DVNC XI (INSTRUMENTS) ×1 IMPLANT
CHLORAPREP W/TINT 26 (MISCELLANEOUS) ×1 IMPLANT
CLIP LIGATING HEM O LOK PURPLE (MISCELLANEOUS) ×1 IMPLANT
DEFOGGER SCOPE WARMER CLEARIFY (MISCELLANEOUS) ×1 IMPLANT
DERMABOND ADVANCED .7 DNX12 (GAUZE/BANDAGES/DRESSINGS) ×1 IMPLANT
DRAPE ARM DVNC X/XI (DISPOSABLE) ×4 IMPLANT
DRAPE COLUMN DVNC XI (DISPOSABLE) ×1 IMPLANT
ELECT REM PT RETURN 9FT ADLT (ELECTROSURGICAL) ×1
ELECTRODE REM PT RTRN 9FT ADLT (ELECTROSURGICAL) ×1 IMPLANT
FORCEPS BPLR R/ABLATION 8 DVNC (INSTRUMENTS) ×1 IMPLANT
FORCEPS PROGRASP DVNC XI (FORCEP) ×1 IMPLANT
GLOVE BIOGEL PI IND STRL 6.5 (GLOVE) ×2 IMPLANT
GLOVE BIOGEL PI IND STRL 7.0 (GLOVE) ×3 IMPLANT
GLOVE SURG SS PI 6.5 STRL IVOR (GLOVE) ×2 IMPLANT
GOWN STRL REUS W/TWL LRG LVL3 (GOWN DISPOSABLE) ×3 IMPLANT
GRASPER SUT TROCAR 14GX15 (MISCELLANEOUS) ×1 IMPLANT
HEMOSTAT SNOW SURGICEL 2X4 (HEMOSTASIS) IMPLANT
IRRIGATOR SUCT 8 DISP DVNC XI (IRRIGATION / IRRIGATOR) IMPLANT
KIT TURNOVER KIT A (KITS) ×1 IMPLANT
MANIFOLD NEPTUNE II (INSTRUMENTS) ×1 IMPLANT
NDL HYPO 21X1.5 SAFETY (NEEDLE) ×1 IMPLANT
NDL INSUFFLATION 14GA 120MM (NEEDLE) ×1 IMPLANT
NEEDLE HYPO 21X1.5 SAFETY (NEEDLE) ×1 IMPLANT
NEEDLE INSUFFLATION 14GA 120MM (NEEDLE) ×1 IMPLANT
OBTURATOR OPTICAL STND 8 DVNC (TROCAR) ×1
OBTURATOR OPTICALSTD 8 DVNC (TROCAR) ×1 IMPLANT
PACK LAP CHOLE LZT030E (CUSTOM PROCEDURE TRAY) ×1 IMPLANT
PAD ARMBOARD 7.5X6 YLW CONV (MISCELLANEOUS) ×1 IMPLANT
PENCIL HANDSWITCHING (ELECTRODE) IMPLANT
POSITIONER HEAD 8X9X4 ADT (SOFTGOODS) ×1 IMPLANT
POWDER SURGICEL 3.0 GRAM (HEMOSTASIS) IMPLANT
SEAL UNIV 5-12 XI (MISCELLANEOUS) ×3 IMPLANT
SET BASIN LINEN APH (SET/KITS/TRAYS/PACK) ×1 IMPLANT
SET TUBE SMOKE EVAC HIGH FLOW (TUBING) ×1 IMPLANT
SUT MNCRL AB 4-0 PS2 18 (SUTURE) ×2 IMPLANT
SUT VICRYL 0 AB UR-6 (SUTURE) IMPLANT
SYR 30ML LL (SYRINGE) ×1 IMPLANT
SYS RETRIEVAL 5MM INZII UNIV (BASKET) ×1
SYSTEM RETRIEVL 5MM INZII UNIV (BASKET) IMPLANT
TIP ENDOSCOPIC SURGICEL (TIP) IMPLANT
WATER STERILE IRR 500ML POUR (IV SOLUTION) ×1 IMPLANT

## 2022-09-15 NOTE — Anesthesia Postprocedure Evaluation (Signed)
Anesthesia Post Note  Patient: Gloria Rose  Procedure(s) Performed: XI ROBOTIC ASSISTED LAPAROSCOPIC CHOLECYSTECTOMY (Abdomen)  Patient location during evaluation: Phase II Anesthesia Type: General Level of consciousness: awake Pain management: pain level controlled Vital Signs Assessment: post-procedure vital signs reviewed and stable Respiratory status: spontaneous breathing and respiratory function stable Cardiovascular status: blood pressure returned to baseline and stable Postop Assessment: no headache and no apparent nausea or vomiting Anesthetic complications: no Comments: Late entry   No notable events documented.   Last Vitals:  Vitals:   09/15/22 1433 09/15/22 1825  BP: 136/74 136/78  Pulse: 97 88  Resp: 15 18  Temp: 36.9 C 36.9 C  SpO2: 93% 96%    Last Pain:  Vitals:   09/15/22 1825  TempSrc: Oral  PainSc:                  Windell Norfolk

## 2022-09-15 NOTE — Progress Notes (Addendum)
Pt returned to room 340 via bed from PACU. Pt drowsy but awake and oriented x4. Skin warm an dry, color appropriate. Moves all extremities without difficulty. Surgical glue intact to surgical sites x4. Breath sounds clear bilaterally, pt c/o right upper abd discomfort with deep breaths. HR RRR. Abd soft, obese, non-distended with hypoactive bowel sounds. IV site WNL. DDI to right neck s/p central line removal. SCD's on. VSS. Bed alarm on for safety, call bell within reach. Pt advised to call for needs, states understanding.

## 2022-09-15 NOTE — Anesthesia Procedure Notes (Signed)
Procedure Name: Intubation Date/Time: 09/15/2022 10:28 AM  Performed by: Jeanette Caprice, CRNAPre-anesthesia Checklist: Patient identified, Emergency Drugs available, Suction available and Patient being monitored Patient Re-evaluated:Patient Re-evaluated prior to induction Oxygen Delivery Method: Circle system utilized Preoxygenation: Pre-oxygenation with 100% oxygen Induction Type: IV induction Ventilation: Mask ventilation without difficulty Laryngoscope Size: Mac and 3 Grade View: Grade I Tube type: Oral Number of attempts: 1 Airway Equipment and Method: Stylet and Oral airway Placement Confirmation: ETT inserted through vocal cords under direct vision, positive ETCO2 and breath sounds checked- equal and bilateral Secured at: 20 cm Tube secured with: Tape Dental Injury: Teeth and Oropharynx as per pre-operative assessment

## 2022-09-15 NOTE — Progress Notes (Signed)
[facility]  Spoke with the patient's husband in the consultation room.  I explained that she tolerated the procedure without difficulty.  She has dissolvable stitches under the skin with overlying skin glue.  This will flake off in 10 to 14 days.  She will return to her room on the floor.  She will have a regular diet ordered in addition to pain medications.  Hopefully, she will be stable for discharge home tomorrow pending pain control and tolerance of diet.  She will be discharged home with a prescription for narcotic pain medication that they should take as needed for pain.  I also want her taking scheduled Tylenol.  If they take the narcotic pain medication, they should take a stool softener as well.  The patient will follow-up with me in 2 weeks.  All questions were answered to his expressed satisfaction.  Plan: -Return to bed on the floor -Regular diet -PRN pain control and antiemetics -Continue antibiotics for bacteremia.  No need for further antibiotics from gallbladder standpoint -CMP in AM -Hopeful for discharge home tomorrow  Theophilus Kinds, DO [facility] [address] [place], [place] [address] [phone] (office)

## 2022-09-15 NOTE — Anesthesia Preprocedure Evaluation (Signed)
Anesthesia Evaluation  Patient identified by MRN, date of birth, ID band Patient awake    Reviewed: Allergy & Precautions, H&P , NPO status , Patient's Chart, lab work & pertinent test results, reviewed documented beta blocker date and time   Airway Mallampati: II  TM Distance: >3 FB Neck ROM: full    Dental no notable dental hx.    Pulmonary neg pulmonary ROS   Pulmonary exam normal breath sounds clear to auscultation       Cardiovascular Exercise Tolerance: Good hypertension, negative cardio ROS  Rhythm:regular Rate:Normal     Neuro/Psych  PSYCHIATRIC DISORDERS  Depression    negative neurological ROS  negative psych ROS   GI/Hepatic negative GI ROS, Neg liver ROS,GERD  ,,  Endo/Other  negative endocrine ROSdiabetes    Renal/GU negative Renal ROS  negative genitourinary   Musculoskeletal   Abdominal   Peds  Hematology negative hematology ROS (+)   Anesthesia Other Findings   Reproductive/Obstetrics negative OB ROS                             Anesthesia Physical Anesthesia Plan  ASA: 2  Anesthesia Plan: General and General ETT   Post-op Pain Management:    Induction:   PONV Risk Score and Plan: Ondansetron and Scopolamine patch - Pre-op  Airway Management Planned:   Additional Equipment:   Intra-op Plan:   Post-operative Plan:   Informed Consent: I have reviewed the patients History and Physical, chart, labs and discussed the procedure including the risks, benefits and alternatives for the proposed anesthesia with the patient or authorized representative who has indicated his/her understanding and acceptance.     Dental Advisory Given  Plan Discussed with: CRNA  Anesthesia Plan Comments:        Anesthesia Quick Evaluation

## 2022-09-15 NOTE — Op Note (Signed)
[facility] Operative Note  09/15/22  Preoperative Diagnosis: Acute cholecystitis   Postoperative Diagnosis: Same   Procedure(s) Performed: Robotic Assisted Laparoscopic Cholecystectomy   Surgeon: Theophilus Kinds, DO   Assistants: Governor Rooks, MS3   Anesthesia: General endotracheal   Anesthesiologist: Windell Norfolk, MD    Specimens: Gallbladder   Estimated Blood Loss: 40 cc   Blood Replacement: None    Complications: None   Wound Class: Contaminated   Operative Indications: Patient is a 72 year old female who was admitted with concern for acute cholecystitis and possible cholangitis.  She underwent a CT of the abdomen and pelvis which demonstrated gallbladder distention and cholelithiasis with mild gallbladder wall thickening.  She subsequently became more hemodynamically unstable and hypotensive requiring vasopressor medications.  She initially had a lactic acidosis which subsequently resolved with IV fluids.  She underwent an abdominal ultrasound which demonstrated wall thickening and cholelithiasis without sonographic Murphy's and MRCP which demonstrated worsening wall thickening, concerning for cholecystitis without evidence of choledocholithiasis.  She had improving blood work and was hemodynamically stable throughout the weekend.  Decision was made to take the patient to the operating room for cholecystectomy.  We discussed the risk of the procedure including but not limited to bleeding, infection, injury to the common bile duct, bile leak, need for further procedures, chance of subtotal cholecystectomy.   Findings:  Acute cholecystitis, enlarged node of Calot Critical view of safety noted All clips intact at the end of the case Adequate hemostasis   Procedure: Firefly was given in the preoperative area. The patient was taken to the operating room and placed supine. General endotracheal anesthesia was induced.  Patient has been receiving IV  antibiotics on the floor.  An orogastric tube positioned to decompress the stomach. The abdomen was prepared and draped in the usual sterile fashion. A time-out was completed verifying correct patient, procedure, site, positioning, and implant(s) and/or special equipment prior to beginning this procedure.  Veress needle was placed at the infraumbilical area and insufflation was started after confirming a positive saline drop test and no immediate increase in abdominal pressure.  After reaching 15 mm, the Veress needle was removed and a 8 mm port was placed via optiview technique infraumbilical, measuring 20 mm away from the suspected position of the gallbladder.  The abdomen was inspected and no abnormalities or injuries were found.  Under direct vision, ports were placed in the following locations in a semi curvilinear position around the target of the gallbladder: Two 8 mm ports on the patient's right each having 8cm clearance to the adjacent ports and one 8 mm port placed on the patient's left 8 cm from the umbilical port. Once ports were placed, the table was placed in the reverse Trendelenburg position with the right side up. The Xi platform was brought into the operative field and docked to the ports successfully.  An endoscope was placed through the umbilical port, prograsp through the most lateral right port, fenestrated bipolar to the port just right of the umbilicus, and then a hook cautery in the left port.  The dome of the gallbladder was grasped with prograsp and retracted over the dome of the liver.  The gallbladder was noted to be moderately distended, edematous, and inflamed.  Adhesions between the gallbladder and omentum, duodenum and transverse colon were lysed via hook cautery. The infundibulum was grasped with the fenestrated grasper and retracted toward the right lower quadrant. This maneuver exposed Calot's triangle. Firefly was used throughout the dissection to ensure safe  visualization of  the cystic duct.  The peritoneum overlying the gallbladder infundibulum was then dissected and the cystic duct and cystic artery identified.  Critical view of safety with the liver bed clearly visible behind the duct and artery with no additional structures noted.  The cystic duct and cystic artery were doubly clipped and divided close to the gallbladder.    The gallbladder was then dissected from its peritoneal and liver bed attachments by electrocautery. Hemostasis was checked prior to removing the hook cautery.  The [employer] was undocked and moved out of the field.  A 5mm Endo Catch bag was then placed through the umbilical port and the gallbladder was removed.  The gallbladder was passed off the table as a specimen. There was no evidence of bleeding from the gallbladder fossa or cystic artery or leakage of the bile from the cystic duct stump. The umbilical port site closed with a 0 vicryl with a PMI needle.  The abdomen was desufflated and secondary trocars were removed under direct vision. No bleeding was noted. Incisions were localized with marcaine.  All skin incisions were closed with subcuticular sutures of 4-0 monocryl and dermabond.   Final inspection revealed acceptable hemostasis. All counts were correct at the end of the case. The patient was awakened from anesthesia and extubated without complication. The OG tube was removed.  The patient went to the PACU in stable condition.   Theophilus Kinds, DO [facility] [address] [place], [place] [address] [phone] (office)

## 2022-09-15 NOTE — Discharge Instructions (Signed)
Surgery Discharge Instructions  Activity  You are advised to go directly home from the hospital.  Resume light activity. No heavy lifting over 10 lbs or strenuous exercise.  Fluids and Diet Regular diet  Medications  If you have not had a bowel movement in 24 hours, take 2 tablespoons over the counter Milk of mag.             You May resume your blood thinners tomorrow (Aspirin, coumadin, or other).  You are being discharged with prescriptions for Opioid/Narcotic Medications: There are some specific considerations for these medications that you should know. Opioid Meds have risks & benefits. Addiction to these meds is always a concern with prolonged use Take medication only as directed Do not drive while taking narcotic pain medication Do not crush tablets or capsules Do not use a different container than medication was dispensed in Lock the container of medication in a cool, dry place out of reach of children and pets. Opioid medication can cause addiction Do not share with anyone else (this is a felony) Do not store medications for future use. Dispose of them properly.     Disposal:  Find a [employer] household drug take back site near you.  If you can't get to a drug take back site, use the recipe below as a last resort to dispose of expired, unused or unwanted drugs. Disposal  (Do not dispose chemotherapy drugs this way, talk to your prescribing doctor instead.) Step 1: Mix drugs (do not crush) with dirt, kitty litter, or used coffee grounds and add a small amount of water to dissolve any solid medications. Step 2: Seal drugs in plastic bag. Step 3: Place plastic bag in trash. Step 4: Take prescription container and scratch out personal information, then recycle or throw away.  Operative Site  You have a liquid bandage over your incisions, this will begin to flake off in about a week. Ok to English as a second language teacher. Keep wound clean and dry. No baths or swimming. No lifting more than 10  pounds.  Contact Information: If you have questions or concerns, please call our office, [phone], Monday- Thursday 8AM-5PM and Friday 8AM-12Noon.  If it is after hours or on the weekend, please call [facility] Main Number, [phone], and ask to speak to the surgeon on call for Dr. Robyne Peers at [facility].   SPECIFIC COMPLICATIONS TO WATCH FOR: Inability to urinate Fever over 101? F by mouth Nausea and vomiting lasting longer than 24 hours. Pain not relieved by medication ordered Swelling around the operative site Increased redness, warmth, hardness, around operative area Numbness, tingling, or cold fingers or toes Blood -soaked dressing, (small amounts of oozing may be normal) Increasing and progressive drainage from surgical area or exam site

## 2022-09-15 NOTE — Progress Notes (Signed)
[facility] Progress Note  Day of Surgery  Subjective: Patient seen and examined.  She is resting comfortably in bed.  She denies abdominal pain at this time and she has no acute complaints.  She is ready to proceed with surgery.  Objective: Vital signs in last 24 hours: Temp:  [98.4 F (36.9 C)-99.4 F (37.4 C)] 98.7 F (37.1 C) (09/09 0513) Pulse Rate:  [71-85] 85 (09/09 0513) Resp:  [16-20] 20 (09/09 0513) BP: (131-139)/(74-99) 139/99 (09/09 0513) SpO2:  [96 %-99 %] 96 % (09/09 0513) Last BM Date : 09/12/22  Intake/Output from previous day: 09/08 0701 - 09/09 0700 In: 815.8 [P.O.:480; IV Piggyback:335.8] Out: 300 [Urine:300] Intake/Output this shift: No intake/output data recorded.  General appearance: alert, cooperative, and no distress GI: Abdomen soft, nondistended, no percussion tenderness; nontender to palpation; no rigidity, guarding or rebound tenderness; negative murphy's sign  Lab Results:  Recent Labs    09/13/22 0403 09/15/22 0503  WBC 9.0 4.2  HGB 9.9* 10.9*  HCT 30.5* 33.4*  PLT 178 166   BMET Recent Labs    09/14/22 0440 09/15/22 0503  NA 135 136  K 4.3 3.9  CL 103 103  CO2 26 25  GLUCOSE 100* 113*  BUN 13 11  CREATININE 0.80 0.74  CALCIUM 8.9 9.6   PT/INR No results for input(s): "LABPROT", "INR" in the last 72 hours.  Studies/Results: No results found.  Anti-infectives: Anti-infectives (From admission, onward)    Start     Dose/Rate Route Frequency Ordered Stop   09/13/22 1000  [MAR Hold]  cefTRIAXone (ROCEPHIN) 2 g in sodium chloride 0.9 % 100 mL IVPB        (MAR Hold since Mon 09/15/2022 at 0938.Hold Reason: Transfer to a Procedural area)   2 g 200 mL/hr over 30 Minutes Intravenous Every 24 hours 09/12/22 1431     09/12/22 2200  vancomycin (VANCOCIN) IVPB 1000 mg/200 mL premix  Status:  Discontinued        1,000 mg 200 mL/hr over 60 Minutes Intravenous Every 24 hours 09/12/22 0751 09/12/22 1431   09/12/22 2200   vancomycin (VANCOCIN) IVPB 1000 mg/200 mL premix  Status:  Discontinued        1,000 mg 200 mL/hr over 60 Minutes Intravenous Every 24 hours 09/12/22 1509 09/13/22 1618   09/12/22 0900  ceFEPIme (MAXIPIME) 2 g in sodium chloride 0.9 % 100 mL IVPB  Status:  Discontinued        2 g 200 mL/hr over 30 Minutes Intravenous 2 times daily 09/12/22 0834 09/12/22 1431   09/12/22 0900  [MAR Hold]  metroNIDAZOLE (FLAGYL) IVPB 500 mg        (MAR Hold since Mon 09/15/2022 at 0938.Hold Reason: Transfer to a Procedural area)   500 mg 100 mL/hr over 60 Minutes Intravenous 2 times daily 09/12/22 0834     09/12/22 0300  aztreonam (AZACTAM) 2 g in sodium chloride 0.9 % 100 mL IVPB  Status:  Discontinued        2 g 200 mL/hr over 30 Minutes Intravenous Every 8 hours 09/11/22 1851 09/12/22 0834   09/11/22 1900  vancomycin (VANCOREADY) IVPB 1250 mg/250 mL  Status:  Discontinued        1,250 mg 166.7 mL/hr over 90 Minutes Intravenous Every 24 hours 09/11/22 1851 09/12/22 0751   09/11/22 1845  aztreonam (AZACTAM) 2 g in sodium chloride 0.9 % 100 mL IVPB        2 g 200 mL/hr over 30 Minutes  Intravenous  Once 09/11/22 1839 09/11/22 2026   09/11/22 1845  metroNIDAZOLE (FLAGYL) IVPB 500 mg        500 mg 100 mL/hr over 60 Minutes Intravenous  Once 09/11/22 1839 09/11/22 2215   09/11/22 1845  vancomycin (VANCOCIN) IVPB 1000 mg/200 mL premix  Status:  Discontinued        1,000 mg 200 mL/hr over 60 Minutes Intravenous  Once 09/11/22 1839 09/11/22 1849       Assessment/Plan:  Patient is a 72 year old female who was admitted with concern for septic shock from possible cholecystitis.   -Imaging is most consistent with cholecystitis without choledocholithiasis -LFTs continuing to improve with total bilirubin of 1.4 from 1.7 yesterday -Initial blood cultures with Enterobacteralis, E. coli, and Klebsiella oxytoca -Continue IV antibiotics -NPO -PRN pain control and antiemetics -IV fluids per primary team -Plan to  proceed with surgery today -Further recommendations to follow surgery -Appreciate hospitalist recommendations   LOS: 3 days    Ronny Korff A Beckey Polkowski 09/15/2022

## 2022-09-15 NOTE — Transfer of Care (Signed)
Immediate Anesthesia Transfer of Care Note  Patient: Gloria Rose  Procedure(s) Performed: XI ROBOTIC ASSISTED LAPAROSCOPIC CHOLECYSTECTOMY (Abdomen)  Patient Location: PACU  Anesthesia Type:General  Level of Consciousness: awake, alert , and patient cooperative  Airway & Oxygen Therapy: Patient Spontanous Breathing and Patient connected to face mask oxygen  Post-op Assessment: Report given to RN and Post -op Vital signs reviewed and stable  Post vital signs: Reviewed and stable  Last Vitals:  Vitals Value Taken Time  BP 133/73 09/15/22 1256  Temp 37.1 C 09/15/22 1256  Pulse 99 09/15/22 1259  Resp 15 09/15/22 1259  SpO2 99 % 09/15/22 1259  Vitals shown include unfiled device data.  Last Pain:  Vitals:   09/15/22 0953  TempSrc: Oral  PainSc: 0-No pain      Patients Stated Pain Goal: 7 (09/15/22 0953)  Complications: No notable events documented.

## 2022-09-15 NOTE — Progress Notes (Signed)
PROGRESS NOTE   Gloria Rose, is a 72 y.o. female, DOB - August 03, 1950, RUE:[id number]  Admit date - 09/11/2022   Admitting Physician Frankey Shown, DO  Outpatient Primary MD for the patient is Gloria Rutherford, NP  LOS - 3  Chief Complaint  Patient presents with   Abdominal Pain      Brief Summary 72 y.o. female with medical history significant of hypertension, GERD, hyperlipidemia, depression admitted on 09/12/2022 with sepsis and bacteremia secondary to acute calculus cholecystitis -Tentatively lap chole planned for Monday, 09/15/2022   -Assessment and Plan: 1)Severe Sepsis and Bacteremia with septic shock secondary to acute calculus cholecystitis--- positive blood cultures noted -CT abdomen and pelvis and right upper quadrant ultrasound suggest calculus cholecystitis ---MRCP shows abnormal gallbladder with progressive wall thickening and edema. Gallstones with a stone towards the neck suggestive of acute cholecystitis, no frank choledocholithiasis- -General Surgery consult appreciated tentatively surgery is planned for Monday, 09/15/2022 -Weaned off IV Levophed on 09/12/22 -Lactic acidosis noted -Elevated LFTs noted--- LFTs are trending down--AST is down to 45 from a peak of 158, ALT is down to 66 from a peak of 111, bilirubin is down to 1.4 from a peak of 3.4 Blood Cx from 09/11/22 with staph epi in anaerobic bottle Blood cx from 09/11/22---E coli and Klebiella oxytoca---sensitivities pending -Stopped IV vancomycin as of 09/13/2022 --Continue IV Flagyl and Rocephin  --Continue IV fluids   2)Hypokalemia/hypophosphatemia--replace and recheck -  3)GERD--continue Protonix   4)HLD--hold statin due to elevated LFTs in the setting of gallstones/cholecystitis  5) acute anemia--suspect some component of hemodilution due to IV fluids -Hgb currently above 10   Status is: Inpatient   Disposition: The patient is from: Home              Anticipated d/c is to: Home              Anticipated d/c  date is: 2 days              Patient currently is not medically stable to d/c. Barriers: Not Clinically Stable-   Code Status :  -  Code Status: Full Code   Family Communication:    (patient is alert, awake and coherent)  Discussed with husband at bedside  DVT Prophylaxis  :   - SCDs SCD's Start: 09/14/22 1510 heparin injection 5,000 Units Start: 09/12/22 0600 SCDs Start: 09/12/22 0528   Lab Results  Component Value Date   PLT 166 09/15/2022   Inpatient Medications  Scheduled Meds:  [MAR Hold] Chlorhexidine Gluconate Cloth  6 each Topical Daily   [MAR Hold] heparin  5,000 Units Subcutaneous Q8H   indocyanine green       [MAR Hold] mupirocin ointment   Nasal BID   scopolamine  1 patch Transdermal Once   scopolamine       [MAR Hold] traZODone  100 mg Oral QHS   Continuous Infusions:  sodium chloride 250 mL (09/11/22 2348)   [MAR Hold] cefTRIAXone (ROCEPHIN)  IV 0 g (09/14/22 1005)   lactated ringers 10 mL/hr at 09/15/22 1014   [MAR Hold] metronidazole Stopped (09/15/22 1023)   PRN Meds:.[MAR Hold] acetaminophen, bupivacaine(PF), [MAR Hold] fentaNYL (SUBLIMAZE) injection, hemostatic agents (no charge) Optime, indocyanine green, [MAR Hold] ondansetron (ZOFRAN) IV, scopolamine, sterile water for irrigation   Anti-infectives (From admission, onward)    Start     Dose/Rate Route Frequency Ordered Stop   09/13/22 1000  [MAR Hold]  cefTRIAXone (ROCEPHIN) 2 g in sodium chloride 0.9 % 100 mL IVPB        (  MAR Hold since Mon 09/15/2022 at 0938.Hold Reason: Transfer to a Procedural area)   2 g 200 mL/hr over 30 Minutes Intravenous Every 24 hours 09/12/22 1431     09/12/22 2200  vancomycin (VANCOCIN) IVPB 1000 mg/200 mL premix  Status:  Discontinued        1,000 mg 200 mL/hr over 60 Minutes Intravenous Every 24 hours 09/12/22 0751 09/12/22 1431   09/12/22 2200  vancomycin (VANCOCIN) IVPB 1000 mg/200 mL premix  Status:  Discontinued        1,000 mg 200 mL/hr over 60 Minutes Intravenous  Every 24 hours 09/12/22 1509 09/13/22 1618   09/12/22 0900  ceFEPIme (MAXIPIME) 2 g in sodium chloride 0.9 % 100 mL IVPB  Status:  Discontinued        2 g 200 mL/hr over 30 Minutes Intravenous 2 times daily 09/12/22 0834 09/12/22 1431   09/12/22 0900  [MAR Hold]  metroNIDAZOLE (FLAGYL) IVPB 500 mg        (MAR Hold since Mon 09/15/2022 at 0938.Hold Reason: Transfer to a Procedural area)   500 mg 100 mL/hr over 60 Minutes Intravenous 2 times daily 09/12/22 0834     09/12/22 0300  aztreonam (AZACTAM) 2 g in sodium chloride 0.9 % 100 mL IVPB  Status:  Discontinued        2 g 200 mL/hr over 30 Minutes Intravenous Every 8 hours 09/11/22 1851 09/12/22 0834   09/11/22 1900  vancomycin (VANCOREADY) IVPB 1250 mg/250 mL  Status:  Discontinued        1,250 mg 166.7 mL/hr over 90 Minutes Intravenous Every 24 hours 09/11/22 1851 09/12/22 0751   09/11/22 1845  aztreonam (AZACTAM) 2 g in sodium chloride 0.9 % 100 mL IVPB        2 g 200 mL/hr over 30 Minutes Intravenous  Once 09/11/22 1839 09/11/22 2026   09/11/22 1845  metroNIDAZOLE (FLAGYL) IVPB 500 mg        500 mg 100 mL/hr over 60 Minutes Intravenous  Once 09/11/22 1839 09/11/22 2215   09/11/22 1845  vancomycin (VANCOCIN) IVPB 1000 mg/200 mL premix  Status:  Discontinued        1,000 mg 200 mL/hr over 60 Minutes Intravenous  Once 09/11/22 1839 09/11/22 1849      Subjective: Desire Sorey today has no fevers,  No chest pain,   - -Husband at bedside, questions answered -N.p.o. and reports being hungry -Eagerly anticipating surgery later today  Objective: Vitals:   09/14/22 1455 09/14/22 2014 09/15/22 0513 09/15/22 0953  BP: 138/74 138/74 (!) 139/99 136/78  Pulse: 75 73 85 95  Resp: 16 20 20 14   Temp: 99.4 F (37.4 C) 98.4 F (36.9 C) 98.7 F (37.1 C) 98.7 F (37.1 C)  TempSrc: Oral   Oral  SpO2: 97% 99% 96% 98%    Intake/Output Summary (Last 24 hours) at 09/15/2022 1234 Last data filed at 09/15/2022 1035 Gross per 24 hour  Intake 231.17  ml  Output 300 ml  Net -68.83 ml   Physical Exam  Gen:- Awake Alert,  in no apparent distress  HEENT:- [place].AT, No sclera icterus,  Neck-Supple Neck,No JVD,.right IJ central line in situ Lungs-  CTAB , fair symmetrical air movement CV- S1, S2 normal, regular  Abd-  +ve B.Sounds, Abd Soft, improved abdominal tenderness, no rebound or guarding Extremity/Skin:- No  edema, pedal pulses present  Psych-affect is appropriate, oriented x3 Neuro-no new focal deficits, no tremors  Data Reviewed: I have personally reviewed following labs and  imaging studies  CBC: Recent Labs  Lab 09/11/22 1748 09/12/22 0603 09/13/22 0403 09/15/22 0503  WBC 10.7* 27.3* 9.0 4.2  HGB 12.9 11.3* 9.9* 10.9*  HCT 39.5 33.8* 30.5* 33.4*  MCV 83.9 82.4 83.1 82.5  PLT 231 222 178 166   Basic Metabolic Panel: Recent Labs  Lab 09/11/22 1748 09/12/22 0603 09/13/22 0403 09/14/22 0440 09/15/22 0503  NA 137 134* 136 135 136  K 3.4* 3.5 3.4* 4.3 3.9  CL 100 100 103 103 103  CO2 24 24 26 26 25   GLUCOSE 175* 246* 117* 100* 113*  BUN 19 22 18 13 11   CREATININE 0.90 1.20* 0.85 0.80 0.74  CALCIUM 8.8* 8.4* 8.5* 8.9 9.6  MG  --  1.3*  --   --   --   PHOS  --   --  1.9*  --   --    GFR: Estimated Creatinine Clearance: 65.5 mL/min (by C-G formula based on SCr of 0.74 mg/dL). Liver Function Tests: Recent Labs  Lab 09/11/22 1748 09/12/22 0603 09/13/22 0403 09/14/22 0440 09/15/22 0503  AST 105* 158* 105* 61* 45*  ALT 59* 111* 99* 76* 66*  ALKPHOS 104 100 107 126 161*  BILITOT 2.1* 3.4* 3.0* 1.7* 1.4*  PROT 6.7 5.7* 5.2* 5.3* 5.8*  ALBUMIN 3.7 2.8* 2.5* 2.5* 2.7*   Recent Results (from the past 240 hour(s))  SARS Coronavirus 2 by RT PCR (hospital order, performed in [facility] hospital lab) *cepheid single result test* Anterior Nasal Swab     Status: None   Collection Time: 09/11/22  5:48 PM   Specimen: Anterior Nasal Swab  Result Value Ref Range Status   SARS Coronavirus 2 by RT PCR NEGATIVE NEGATIVE  Final    Comment: (NOTE) SARS-CoV-2 target nucleic acids are NOT DETECTED.  The SARS-CoV-2 RNA is generally detectable in upper and lower respiratory specimens during the acute phase of infection. The lowest concentration of SARS-CoV-2 viral copies this assay can detect is 250 copies / mL. A negative result does not preclude SARS-CoV-2 infection and should not be used as the sole basis for treatment or other patient management decisions.  A negative result may occur with improper specimen collection / handling, submission of specimen other than nasopharyngeal swab, presence of viral mutation(s) within the areas targeted by this assay, and inadequate number of viral copies (<250 copies / mL). A negative result must be combined with clinical observations, patient history, and epidemiological information.  Fact Sheet for Patients:   RoadLapTop.co.za  Fact Sheet for Healthcare Providers: http://kim-miller.com/  This test is not yet approved or  cleared by the [place] FDA and has been authorized for detection and/or diagnosis of SARS-CoV-2 by FDA under an Emergency Use Authorization (EUA).  This EUA will remain in effect (meaning this test can be used) for the duration of the COVID-19 declaration under Section 564(b)(1) of the Act, 21 U.S.C. section 360bbb-3(b)(1), unless the authorization is terminated or revoked sooner.  Performed at [facility], [address] [place], [place] [address]   Culture, blood (single)     Status: Abnormal   Collection Time: 09/11/22  7:27 PM   Specimen: Left Antecubital; Blood  Result Value Ref Range Status   Specimen Description   Final    LEFT ANTECUBITAL Performed at [facility], [address], [place], [place] [address]    Special Requests   Final    BOTTLES DRAWN AEROBIC AND ANAEROBIC Blood Culture adequate volume Performed at [facility], [address], [place], [place] [address]     Culture  Setup Time   Final    GRAM NEGATIVE RODS IN BOTH AEROBIC AND ANAEROBIC BOTTLES Gram Stain Report Called to,Read Back By and Verified With: GEORGE R @0740  ON [id number] BY HENDERSONL CRITICAL RESULT CALLED TO, READ BACK BY AND VERIFIED WITH: Barbaraann Barthel [id number] AT 1417 BY CM Performed at [facility], [address] [place], [place] [address]    Culture ESCHERICHIA COLI KLEBSIELLA OXYTOCA  (A)  Final   Report Status 09/14/2022 FINAL  Final   Organism ID, Bacteria ESCHERICHIA COLI  Final   Organism ID, Bacteria KLEBSIELLA OXYTOCA  Final      Susceptibility   Escherichia coli - MIC*    AMPICILLIN 8 SENSITIVE Sensitive     CEFEPIME <=0.12 SENSITIVE Sensitive     CEFTAZIDIME <=1 SENSITIVE Sensitive     CEFTRIAXONE <=0.25 SENSITIVE Sensitive     CIPROFLOXACIN <=0.25 SENSITIVE Sensitive     GENTAMICIN <=1 SENSITIVE Sensitive     IMIPENEM <=0.25 SENSITIVE Sensitive     TRIMETH/SULFA <=20 SENSITIVE Sensitive     AMPICILLIN/SULBACTAM 4 SENSITIVE Sensitive     PIP/TAZO <=4 SENSITIVE Sensitive     * ESCHERICHIA COLI   Klebsiella oxytoca - MIC*    AMPICILLIN >=32 RESISTANT Resistant     CEFEPIME <=0.12 SENSITIVE Sensitive     CEFTAZIDIME <=1 SENSITIVE Sensitive     CEFTRIAXONE <=0.25 SENSITIVE Sensitive     CIPROFLOXACIN <=0.25 SENSITIVE Sensitive     GENTAMICIN <=1 SENSITIVE Sensitive     IMIPENEM <=0.25 SENSITIVE Sensitive     TRIMETH/SULFA <=20 SENSITIVE Sensitive     AMPICILLIN/SULBACTAM 16 INTERMEDIATE Intermediate     PIP/TAZO <=4 SENSITIVE Sensitive     * KLEBSIELLA OXYTOCA  Blood Culture ID Panel (Reflexed)     Status: Abnormal   Collection Time: 09/11/22  7:27 PM  Result Value Ref Range Status   Enterococcus faecalis NOT DETECTED NOT DETECTED Final   Enterococcus Faecium NOT DETECTED NOT DETECTED Final   Listeria monocytogenes NOT DETECTED NOT DETECTED Final   Staphylococcus species NOT DETECTED NOT DETECTED Final   Staphylococcus aureus (BCID) NOT DETECTED  NOT DETECTED Final   Staphylococcus epidermidis NOT DETECTED NOT DETECTED Final   Staphylococcus lugdunensis NOT DETECTED NOT DETECTED Final   Streptococcus species NOT DETECTED NOT DETECTED Final   Streptococcus agalactiae NOT DETECTED NOT DETECTED Final   Streptococcus pneumoniae NOT DETECTED NOT DETECTED Final   Streptococcus pyogenes NOT DETECTED NOT DETECTED Final   A.calcoaceticus-baumannii NOT DETECTED NOT DETECTED Final   Bacteroides fragilis NOT DETECTED NOT DETECTED Final   Enterobacterales DETECTED (A) NOT DETECTED Final    Comment: CRITICAL RESULT CALLED TO, READ BACK BY AND VERIFIED WITH: Marita Snellen [id number] AT 1417 BY CM    Enterobacter cloacae complex NOT DETECTED NOT DETECTED Final   Escherichia coli DETECTED (A) NOT DETECTED Final    Comment: CRITICAL RESULT CALLED TO, READ BACK BY AND VERIFIED WITH: Marita Snellen [id number] AT 1417 BY CM    Klebsiella aerogenes NOT DETECTED NOT DETECTED Final   Klebsiella oxytoca DETECTED (A) NOT DETECTED Final    Comment: CRITICAL RESULT CALLED TO, READ BACK BY AND VERIFIED WITH: Marita Snellen [id number] AT 1417 BY CM    Klebsiella pneumoniae NOT DETECTED NOT DETECTED Final   Proteus species NOT DETECTED NOT DETECTED Final   Salmonella species NOT DETECTED NOT DETECTED Final   Serratia marcescens NOT DETECTED NOT DETECTED Final   Haemophilus  influenzae NOT DETECTED NOT DETECTED Final   Neisseria meningitidis NOT DETECTED NOT DETECTED Final   Pseudomonas aeruginosa NOT DETECTED NOT DETECTED Final   Stenotrophomonas maltophilia NOT DETECTED NOT DETECTED Final   Candida albicans NOT DETECTED NOT DETECTED Final   Candida auris NOT DETECTED NOT DETECTED Final   Candida glabrata NOT DETECTED NOT DETECTED Final   Candida krusei NOT DETECTED NOT DETECTED Final   Candida parapsilosis NOT DETECTED NOT DETECTED Final   Candida tropicalis NOT DETECTED NOT DETECTED Final   Cryptococcus neoformans/gattii NOT DETECTED NOT DETECTED Final    CTX-M ESBL NOT DETECTED NOT DETECTED Final   Carbapenem resistance IMP NOT DETECTED NOT DETECTED Final   Carbapenem resistance KPC NOT DETECTED NOT DETECTED Final   Carbapenem resistance NDM NOT DETECTED NOT DETECTED Final   Carbapenem resist OXA 48 LIKE NOT DETECTED NOT DETECTED Final   Carbapenem resistance VIM NOT DETECTED NOT DETECTED Final    Comment: Performed at [facility], [address], [place], [place] [address]  Blood culture (routine x 2)     Status: Abnormal   Collection Time: 09/11/22  8:47 PM   Specimen: BLOOD  Result Value Ref Range Status   Specimen Description   Final    BLOOD BLOOD LEFT HAND Performed at [facility], [address], [place], [place] [address]    Special Requests   Final    BOTTLES DRAWN AEROBIC AND ANAEROBIC Blood Culture adequate volume Performed at [facility], [address], [place], [place] [address]    Culture  Setup Time   Final    ANAEROBIC BOTTLE ONLY GRAM POSITIVE COCCI Gram Stain Report Called to,Read Back By and Verified With: GEORGE RUSS 09/12/22 1455 NN  CONFIRMED WITH LH CRITICAL RESULT CALLED TO, READ BACK BY AND VERIFIED WITH: RN JESSICA ELLER ON 09/12/22 @ 2259 BY DRT    Culture (A)  Final    STAPHYLOCOCCUS EPIDERMIDIS THE SIGNIFICANCE OF ISOLATING THIS ORGANISM FROM A SINGLE SET OF BLOOD CULTURES WHEN MULTIPLE SETS ARE DRAWN IS UNCERTAIN. PLEASE NOTIFY THE MICROBIOLOGY DEPARTMENT WITHIN ONE WEEK IF SPECIATION AND SENSITIVITIES ARE REQUIRED. Performed at [facility], [address] [place], [place] [address]    Report Status 09/14/2022 FINAL  Final  Blood Culture ID Panel (Reflexed)     Status: Abnormal   Collection Time: 09/11/22  8:47 PM  Result Value Ref Range Status   Enterococcus faecalis NOT DETECTED NOT DETECTED Final   Enterococcus Faecium NOT DETECTED NOT DETECTED Final   Listeria monocytogenes NOT DETECTED NOT DETECTED Final   Staphylococcus species DETECTED (A) NOT DETECTED Final    Comment:  CRITICAL RESULT CALLED TO, READ BACK BY AND VERIFIED WITH: RN JESSICA ELLER ON 09/12/22 @ 2259 BY DRT    Staphylococcus aureus (BCID) NOT DETECTED NOT DETECTED Final   Staphylococcus epidermidis DETECTED (A) NOT DETECTED Final    Comment: CRITICAL RESULT CALLED TO, READ BACK BY AND VERIFIED WITH: RN JESSICA ELLER ON 09/12/22 @ 2259 BY DRT    Staphylococcus lugdunensis NOT DETECTED NOT DETECTED Final   Streptococcus species NOT DETECTED NOT DETECTED Final   Streptococcus agalactiae NOT DETECTED NOT DETECTED Final   Streptococcus pneumoniae NOT DETECTED NOT DETECTED Final   Streptococcus pyogenes NOT DETECTED NOT DETECTED Final   A.calcoaceticus-baumannii NOT DETECTED NOT DETECTED Final   Bacteroides fragilis NOT DETECTED NOT DETECTED Final   Enterobacterales NOT DETECTED NOT DETECTED Final   Enterobacter cloacae complex NOT DETECTED NOT DETECTED Final   Escherichia coli  NOT DETECTED NOT DETECTED Final   Klebsiella aerogenes NOT DETECTED NOT DETECTED Final   Klebsiella oxytoca NOT DETECTED NOT DETECTED Final   Klebsiella pneumoniae NOT DETECTED NOT DETECTED Final   Proteus species NOT DETECTED NOT DETECTED Final   Salmonella species NOT DETECTED NOT DETECTED Final   Serratia marcescens NOT DETECTED NOT DETECTED Final   Haemophilus influenzae NOT DETECTED NOT DETECTED Final   Neisseria meningitidis NOT DETECTED NOT DETECTED Final   Pseudomonas aeruginosa NOT DETECTED NOT DETECTED Final   Stenotrophomonas maltophilia NOT DETECTED NOT DETECTED Final   Candida albicans NOT DETECTED NOT DETECTED Final   Candida auris NOT DETECTED NOT DETECTED Final   Candida glabrata NOT DETECTED NOT DETECTED Final   Candida krusei NOT DETECTED NOT DETECTED Final   Candida parapsilosis NOT DETECTED NOT DETECTED Final   Candida tropicalis NOT DETECTED NOT DETECTED Final   Cryptococcus neoformans/gattii NOT DETECTED NOT DETECTED Final   Methicillin resistance mecA/C NOT DETECTED NOT DETECTED Final    Comment:  Performed at [facility], [address], [place], [place] [address]  MRSA Next Gen by PCR, Nasal     Status: Abnormal   Collection Time: 09/12/22  5:51 AM   Specimen: Nasal Mucosa; Nasal Swab  Result Value Ref Range Status   MRSA by PCR Next Gen DETECTED (A) NOT DETECTED Final    Comment: RESULT CALLED TO, READ BACK BY AND VERIFIED WITH: HYLTON L @ 0932 ON [id number] BY HENDERSON L (NOTE) The GeneXpert MRSA Assay (FDA approved for NASAL specimens only), is one component of a comprehensive MRSA colonization surveillance program. It is not intended to diagnose MRSA infection nor to guide or monitor treatment for MRSA infections. Test performance is not FDA approved in patients less than 3 years old. Performed at [facility], [address], [place], [place] [address]     Radiology Studies: No results found.  Scheduled Meds:  [MAR Hold] Chlorhexidine Gluconate Cloth  6 each Topical Daily   [MAR Hold] heparin  5,000 Units Subcutaneous Q8H   indocyanine green       [MAR Hold] mupirocin ointment   Nasal BID   scopolamine  1 patch Transdermal Once   scopolamine       [MAR Hold] traZODone  100 mg Oral QHS   Continuous Infusions:  sodium chloride 250 mL (09/11/22 2348)   [MAR Hold] cefTRIAXone (ROCEPHIN)  IV 0 g (09/14/22 1005)   lactated ringers 10 mL/hr at 09/15/22 1014   [MAR Hold] metronidazole Stopped (09/15/22 1023)    LOS: 3 days   Shon Hale M.D on 09/15/2022 at 12:34 PM  Go to [email or web address] - for contact info  [facility] - Office  [phone]  If 7PM-7AM, please contact night-coverage [email or web address] 09/15/2022, 12:34 PM

## 2022-09-16 ENCOUNTER — Other Ambulatory Visit: Payer: Self-pay | Admitting: *Deleted

## 2022-09-16 DIAGNOSIS — A419 Sepsis, unspecified organism: Secondary | ICD-10-CM

## 2022-09-16 DIAGNOSIS — R6521 Severe sepsis with septic shock: Secondary | ICD-10-CM | POA: Diagnosis not present

## 2022-09-16 LAB — GLUCOSE, CAPILLARY
Glucose-Capillary: 203 mg/dL — ABNORMAL HIGH (ref 70–99)
Glucose-Capillary: 177 mg/dL — ABNORMAL HIGH (ref 70–99)
Glucose-Capillary: 193 mg/dL — ABNORMAL HIGH (ref 70–99)
Glucose-Capillary: 175 mg/dL — ABNORMAL HIGH (ref 70–99)

## 2022-09-16 LAB — COMPREHENSIVE METABOLIC PANEL
ALT: 76 U/L — ABNORMAL HIGH (ref 0–44)
AST: 89 U/L — ABNORMAL HIGH (ref 15–41)
Albumin: 2.6 g/dL — ABNORMAL LOW (ref 3.5–5.0)
Alkaline Phosphatase: 140 U/L — ABNORMAL HIGH (ref 38–126)
Anion gap: 8 (ref 5–15)
BUN: 16 mg/dL (ref 8–23)
CO2: 23 mmol/L (ref 22–32)
Calcium: 8.6 mg/dL — ABNORMAL LOW (ref 8.9–10.3)
Chloride: 102 mmol/L (ref 98–111)
Creatinine, Ser: 0.87 mg/dL (ref 0.44–1.00)
GFR, Estimated: >60 mL/min (ref >60)
Glucose, Bld: 188 mg/dL — ABNORMAL HIGH (ref 70–99)
Potassium: 4.1 mmol/L (ref 3.5–5.1)
Sodium: 133 mmol/L — ABNORMAL LOW (ref 135–145)
Total Bilirubin: 0.9 mg/dL (ref 0.3–1.2)
Total Protein: 5.8 g/dL — ABNORMAL LOW (ref 6.5–8.1)

## 2022-09-16 MED ORDER — HYDROCHLOROTHIAZIDE 25 MG PO TABS: 12.5000 mg | ORAL_TABLET | Freq: Every day | ORAL | 1 refills | Status: DC

## 2022-09-16 MED ORDER — TRAZODONE HCL 100 MG PO TABS: 100.0000 mg | ORAL_TABLET | Freq: Every evening | ORAL | 1 refills | Status: DC | PRN

## 2022-09-16 MED ORDER — CEFDINIR 300 MG PO CAPS: 300.0000 mg | ORAL_CAPSULE | Freq: Two times a day (BID) | ORAL | 0 refills | Status: AC

## 2022-09-16 MED ORDER — METRONIDAZOLE 500 MG PO TABS: 500.0000 mg | ORAL_TABLET | Freq: Three times a day (TID) | ORAL | 0 refills | Status: AC

## 2022-09-16 NOTE — Consult Note (Signed)
[facility] Customer service manager [facility]) Accountable Care Organization (ACO) [facility] Liaison Note  09/16/2022  [name] [birth year] [id number]  Location: [facility] RN Hospital Liaison screened the patient remotely at [facility].  Insurance: [employer] [facility] HMO   Gloria Rose is a 72 y.o. female who is a Primary Care Patient of Nita Sells, MD. The patient was screened for  readmission hospitalization with noted medium risk score for unplanned readmission risk with 1 IP/1 ED in 6 months.  The patient was assessed for potential [facility]) Care Management service needs for post hospital transition for care coordination. Review of patient's electronic medical record reveals patient was admitted with Septic Shock. Liaison spoke with pt today and introduced care coordination services in the community. Verified PCP as noted above for services introduced. Pt interested and receptive to a post hospital prevention readmission follow up call with a nurse care coordinator for services.   Plan: [facility] Liaison will continue to follow progress and disposition to asess for post hospital community care coordination/management needs.  Referral request for community care coordination: Will make a referral for care coordination as discussed today with pt.   [facility] Care Management/Population Health does not replace or interfere with any arrangements made by the Inpatient Transition of Care team.   For questions contact:   Elliot Cousin, RN, [facility] Liaison [place]   Population Health Office Hours MTWF  8:00 am-6:00 pm [phone] mobile [phone] [Office toll free line] Office Hours are M-F 8:30 - 5 pm [email or web address]

## 2022-09-16 NOTE — Progress Notes (Signed)
Mobility Specialist Progress Note:    09/16/22 1200  Mobility  Activity Refused mobility   Pt eager for discharge, politely declined mobility. All needs met.   Lawerance Bach Mobility Specialist Please contact via Special educational needs teacher or  Rehab office at [phone]

## 2022-09-16 NOTE — Care Management Important Message (Signed)
Important Message  Patient Details  Name: Gloria Rose MRN: [id number] Date of Birth: [birth year]   Medicare Important Message Given:  Yes     Corey Harold 09/16/2022, 12:00 PM

## 2022-09-16 NOTE — Progress Notes (Signed)
Patient states understanding of discharge instructions.  

## 2022-09-16 NOTE — Progress Notes (Addendum)
I was present with the medical student for this service. I personally verified the history of present illness, performed the physical exam, and made the plan for this encounter. I have verified the medical student's documentation and made modifications where appropriately. I have personally documented in my own words a brief history, physical, and plan below.     Patient seen and examined.  She is resting comfortably in bed, eating her breakfast.  She is tolerating a diet without nausea and vomiting.  She is passing flatus, but no bowel movements.  Pain is controlled with oral medications.    Exam: Abdomen: soft, nontender, nondistended, minimal TTP upon palpation, Incisions C/D/I with dermabond in place  Assessment and Plan: Patient is a 72 year old female who is s/p robotic assisted laparoscopic cholecystectomy on 9/9 for acute cholecystitis.  -Tolerating regular diet -Tbili normalized.  Other LFTs slightly increased- likely secondary to surgery -PRN pain control and antiemetics -Stable for discharge from general surgery standpoint -No need for antibiotics related to acute cholecystitis.  Will defer to hospitalist regarding need for antibiotics for bacteremia -Follow up on 2 weeks  Theophilus Kinds, DO [facility] [address] [place], [place] [address] [phone] (office)   1 Day Post-Op  Subjective: Pt reports she is doing very well today. She reports good appetite and was eating breakfast prior to visit. She does report some intermittent R-sided abdominal pain; she reports no pain at rest while seated in a semi-reclined position, but that when she sits upright she experiences 8/10 pain. She denies fever, chills, CP, SOB. She reports she is passing gas, has not yet had a BM. She has not yet gotten up to walk, but is planning to later today.  Objective: Vital signs in last 24 hours: Temp:  [98.5 F (36.9 C)-98.9 F (37.2 C)] 98.9 F (37.2 C)  (09/10 0447) Pulse Rate:  [64-103] 64 (09/10 0447) Resp:  [13-19] 16 (09/10 0447) BP: (113-143)/(63-81) 143/71 (09/10 0447) SpO2:  [93 %-98 %] 97 % (09/10 0447) Last BM Date :  (pt stated she has not had a bm)  Intake/Output from previous day: 09/09 0701 - 09/10 0700 In: 700 [I.V.:500; IV Piggyback:200] Out: 355 [Urine:350; Blood:5] Intake/Output this shift: No intake/output data recorded.  Physical Exam General: Pt is seated up in bed eating breakfast, no acute distress. Cardiovascular: RRR, no murmurs, rubs, gallops. Pulmonary: Normal work of breathing. Lungs clear to auscultation bilaterally. Abdomen: Surgical site incisions healing well; no warmth, leakage. Normal bowel sounds. Soft and nondistended in all four quadrants. No tenderness to palpation; no rebound tenderness, no guarding. Neuro/Psych: Alert and oriented to person, place, event. (Time not formerly assessed.) Normal affect.  Lab Results:  Recent Labs    09/15/22 0503  WBC 4.2  HGB 10.9*  HCT 33.4*  PLT 166   BMET Recent Labs    09/15/22 0503 09/16/22 0453  NA 136 133*  K 3.9 4.1  CL 103 102  CO2 25 23  GLUCOSE 113* 188*  BUN 11 16  CREATININE 0.74 0.87  CALCIUM 9.6 8.6*   PT/INR No results for input(s): "LABPROT", "INR" in the last 72 hours.  Studies/Results: No results found.  Anti-infectives: Anti-infectives (From admission, onward)    Start     Dose/Rate Route Frequency Ordered Stop   09/13/22 1000  cefTRIAXone (ROCEPHIN) 2 g in sodium chloride 0.9 % 100 mL IVPB        2 g 200 mL/hr over 30 Minutes Intravenous Every 24 hours  09/12/22 1431     09/12/22 2200  vancomycin (VANCOCIN) IVPB 1000 mg/200 mL premix  Status:  Discontinued        1,000 mg 200 mL/hr over 60 Minutes Intravenous Every 24 hours 09/12/22 0751 09/12/22 1431   09/12/22 2200  vancomycin (VANCOCIN) IVPB 1000 mg/200 mL premix  Status:  Discontinued        1,000 mg 200 mL/hr over 60 Minutes Intravenous Every 24 hours 09/12/22  1509 09/13/22 1618   09/12/22 0900  ceFEPIme (MAXIPIME) 2 g in sodium chloride 0.9 % 100 mL IVPB  Status:  Discontinued        2 g 200 mL/hr over 30 Minutes Intravenous 2 times daily 09/12/22 0834 09/12/22 1431   09/12/22 0900  metroNIDAZOLE (FLAGYL) IVPB 500 mg        500 mg 100 mL/hr over 60 Minutes Intravenous 2 times daily 09/12/22 0834     09/12/22 0300  aztreonam (AZACTAM) 2 g in sodium chloride 0.9 % 100 mL IVPB  Status:  Discontinued        2 g 200 mL/hr over 30 Minutes Intravenous Every 8 hours 09/11/22 1851 09/12/22 0834   09/11/22 1900  vancomycin (VANCOREADY) IVPB 1250 mg/250 mL  Status:  Discontinued        1,250 mg 166.7 mL/hr over 90 Minutes Intravenous Every 24 hours 09/11/22 1851 09/12/22 0751   09/11/22 1845  aztreonam (AZACTAM) 2 g in sodium chloride 0.9 % 100 mL IVPB        2 g 200 mL/hr over 30 Minutes Intravenous  Once 09/11/22 1839 09/11/22 2026   09/11/22 1845  metroNIDAZOLE (FLAGYL) IVPB 500 mg        500 mg 100 mL/hr over 60 Minutes Intravenous  Once 09/11/22 1839 09/11/22 2215   09/11/22 1845  vancomycin (VANCOCIN) IVPB 1000 mg/200 mL premix  Status:  Discontinued        1,000 mg 200 mL/hr over 60 Minutes Intravenous  Once 09/11/22 1839 09/11/22 1849       Assessment/Plan: s/p Procedure(s): XI ROBOTIC ASSISTED LAPAROSCOPIC CHOLECYSTECTOMY on 09/15/22  Cholecystitis s/p cholecystectomy Pt reports feeling well this morning; eating, passing gas. Plans to walk later today. No evidence of infection of surgical sites. Continue to encourage walking, regular diet. - No further surgical interventions indicated   LOS: 4 days   Governor Rooks, medical student 09/16/2022

## 2022-09-16 NOTE — Discharge Summary (Signed)
Gloria Rose, is a 72 y.o. female  DOB September 28, 1950  MRN [id number].  Admission date:  09/11/2022  Admitting Physician  Frankey Shown, DO  Discharge Date:  09/16/2022   Primary MD  [name], NP  Recommendations for primary care physician for things to follow:  1)Please take antibiotics and other medications as prescribed 2)Please follow-up with general surgeon Dr. Robyne Peers as advised  Admission Diagnosis  Abdominal pain [R10.9]   Discharge Diagnosis  Abdominal pain [R10.9]    Principal Problem:   Septic shock (HCC) Active Problems:   Abdominal pain   Transaminitis   Hypokalemia   Hyperglycemia   Essential hypertension   Mixed hyperlipidemia   GERD without esophagitis   Nausea & vomiting   Lactic acidosis   Calculus of gallbladder with acute cholecystitis without obstruction      Past Medical History:  Diagnosis Date   Cancer (HCC)    melanoma of toe (big toe on left foot) removed   Depression    Diabetes mellitus without complication (HCC)    dx 7-8 yrs  type 2   GERD (gastroesophageal reflux disease)    Hypertension    Nausea & vomiting 09/12/2022    Past Surgical History:  Procedure Laterality Date   COLONOSCOPY WITH PROPOFOL N/A 10/23/2020   Procedure: COLONOSCOPY WITH PROPOFOL;  Surgeon: Lanelle Bal, DO;  Location: [facility];  Service: Endoscopy;  Laterality: N/A;  10:00 / ASA II   RTR     RIGHT SHOULDER IN 2013   TOTAL KNEE ARTHROPLASTY Left 07/03/2014   Procedure: LEFT TOTAL KNEE ARTHROPLASTY;  Surgeon: Dannielle Huh, MD;  Location: [facility] OR;  Service: Orthopedics;  Laterality: Left;       HPI  from the history and physical done on the day of admission:   HPI: Gloria Rose is a 72 y.o. female with medical history significant of hypertension, GERD, hyperlipidemia, depression who presents to the emergency department due to abdominal pain which started yesterday  around 3 PM, this was associated with several episodes of nonbloody vomiting with the last episode being just prior to arrival to the ED.  Abdominal pain was in epigastric area, pain was achy in nature and was rated as 9/10 on pain scale, this was associated with 8 loose bowel movements within a 3-hour period.   ED Course: In the emergency department, vital signs were within normal range on arrival to the ED, however patient became febrile within 1 to 2 hours of being in the ED and was tachypneic and tachycardic.  Workup in the ED showed normal CBC except for WBC of 10.7.  CBC was normal except for potassium of 3.4, blood glucose 175, AST 105 ALT 59 ALP 104, bilirubin 2.1, lactic acid 2.7 > 4.3 > 4.3.  Lipase 35, troponin 4 > 5.  Urinalysis was unimpressive for UTI. SARS coronavirus 2 was negative.  Blood culture was pending. CT chest, abdomen and pelvis with contrast showed cholelithiasis with questionable mild gallbladder wall thickening. Chest x-ray showed right internal jugular central  venous catheter with tip overlying the right atrium but poorly visualized due to overlying EKG leads.  Enteric tube in good position.  No acute cardiopulmonary abnormality. Within few hours of being in the ED, patient's BP dropped to hypotensive range with MAP in the low 50s, central line was placed, IV hydration per sepsis protocol was provided, patient was started on aztreonam, metronidazole and vancomycin.  Pain medication was given. Hospitalist was asked to admit patient for further evaluation and management. At bedside, patient was in no acute distress.  NG tube already drained about 800 mL of brownish fluid.   Review of Systems: Review of systems as noted in the HPI. All other systems reviewed and are negative.   Hospital Course:   Brief Summary 72 y.o. female with medical history significant of hypertension, GERD, hyperlipidemia, depression admitted on 09/12/2022 with sepsis and bacteremia secondary to acute  calculus cholecystitis -Tentatively lap chole planned for Monday, 09/15/2022   -Assessment and Plan: 1)Severe Sepsis and Bacteremia with septic shock secondary to acute calculus cholecystitis--- positive blood cultures noted -CT abdomen and pelvis and right upper quadrant ultrasound suggest calculus cholecystitis ---MRCP shows abnormal gallbladder with progressive wall thickening and edema. Gallstones with a stone towards the neck suggestive of acute cholecystitis, no frank choledocholithiasis- -General Surgery consult appreciated tentatively surgery is planned for Monday, 09/15/2022 -Weaned off IV Levophed on 09/12/22 -Lactic acidosis noted -Elevated LFTs noted--- LFTs are trending down--AST is down to 45 from a peak of 158, ALT is down to 66 from a peak of 111, bilirubin is down to 1.4 from a peak of 3.4 Blood Cx from 09/11/22 with staph epi in anaerobic bottle Blood cx from 09/11/22---E coli and Klebiella oxytoca---sensitivities pending -Stopped IV vancomycin as of 09/13/2022 --Continue IV Flagyl and Rocephin  --Continue IV fluids   2)Hypokalemia/hypophosphatemia--replace and recheck -  3)GERD--continue Protonix   4)HLD--hold statin due to elevated LFTs in the setting of gallstones/cholecystitis   5) acute anemia--suspect some component of hemodilution due to IV fluids -Hgb currently above 10   Status is: Inpatient    Disposition: The patient is from: Home              Anticipated d/c is to: Home    Discharge Condition: ***  Follow UP   Follow-up Information     Pappayliou, Catherine A, DO. Call.   Specialty: General Surgery Why: Call to schedule a follow up appointment in 2 weeks Contact information: [id number] [place] [facility] [address] [phone]                  Consults obtained - ***  Diet and Activity recommendation:  As advised  Discharge Instructions    **** Discharge Instructions     Call MD for:  difficulty breathing, headache or visual  disturbances   Complete by: As directed    Call MD for:  persistant dizziness or light-headedness   Complete by: As directed    Call MD for:  persistant nausea and vomiting   Complete by: As directed    Call MD for:  persistant nausea and vomiting   Complete by: As directed    Call MD for:  redness, tenderness, or signs of infection (pain, swelling, redness, odor or green/yellow discharge around incision site)   Complete by: As directed    Call MD for:  redness, tenderness, or signs of infection (pain, swelling, redness, odor or green/yellow discharge around incision site)   Complete by: As directed    Call MD for:  severe uncontrolled pain   Complete by: As directed    Call MD for:  temperature >100.4   Complete by: As directed    Call MD for:  temperature >100.4   Complete by: As directed    Diet - low sodium heart healthy   Complete by: As directed    Diet - low sodium heart healthy   Complete by: As directed    Discharge instructions   Complete by: As directed    1)Please take antibiotics and other medications as prescribed 2)Please follow-up with general surgeon Dr. Robyne Peers as advised   Increase activity slowly   Complete by: As directed    Increase activity slowly   Complete by: As directed          Discharge Medications     Allergies as of 09/16/2022       Reactions   Penicillins Anaphylaxis, Hives   Difficulty breathing        Medication List     STOP taking these medications    PARoxetine 40 MG tablet Commonly known as: PAXIL       TAKE these medications    acetaminophen 500 MG tablet Commonly known as: TYLENOL Take 2 tablets (1,000 mg total) by mouth every 6 (six) hours for 7 days.   amLODipine 5 MG tablet Commonly known as: NORVASC Take 5 mg by mouth at bedtime.   aspirin EC 81 MG tablet Take 81 mg by mouth at bedtime.   atenolol 100 MG tablet Commonly known as: TENORMIN Take 100 mg by mouth at bedtime.   cefdinir 300 MG  capsule Commonly known as: OMNICEF Take 1 capsule (300 mg total) by mouth 2 (two) times daily for 5 days.   docusate sodium 100 MG capsule Commonly known as: Colace Take 1 capsule (100 mg total) by mouth 2 (two) times daily.   hydrochlorothiazide 25 MG tablet Commonly known as: HYDRODIURIL Take 0.5 tablets (12.5 mg total) by mouth at bedtime. What changed: how much to take   lisinopril 20 MG tablet Commonly known as: ZESTRIL Take 20 mg by mouth at bedtime.   metroNIDAZOLE 500 MG tablet Commonly known as: FLAGYL Take 1 tablet (500 mg total) by mouth 3 (three) times daily for 5 days.   omeprazole 20 MG capsule Commonly known as: PRILOSEC Take 20 mg by mouth at bedtime.   ondansetron 4 MG tablet Commonly known as: Zofran Take 1 tablet (4 mg total) by mouth daily as needed for nausea or vomiting.   oxyCODONE 5 MG immediate release tablet Commonly known as: Roxicodone Take 1 tablet (5 mg total) by mouth every 6 (six) hours as needed.   pravastatin 40 MG tablet Commonly known as: PRAVACHOL Take 40 mg by mouth at bedtime.   sertraline 50 MG tablet Commonly known as: ZOLOFT Take 50 mg by mouth daily.   traZODone 100 MG tablet Commonly known as: DESYREL Take 1 tablet (100 mg total) by mouth at bedtime as needed for sleep.   Vitamin D (Ergocalciferol) 1.25 MG (50000 UNIT) Caps capsule Commonly known as: DRISDOL Take 50,000 Units by mouth every 7 (seven) days.        Major procedures and Radiology Reports - PLEASE review detailed and final reports for all details, in brief -   ***  MR 3D Recon At Scanner  Result Date: 09/15/2022 CLINICAL DATA:  Right upper quadrant pain. EXAM: MRI ABDOMEN WITHOUT AND WITH CONTRAST (INCLUDING MRCP) TECHNIQUE: Multiplanar multisequence MR imaging of the abdomen was performed both  before and after the administration of intravenous contrast. Heavily T2-weighted images of the biliary and pancreatic ducts were obtained, and three-dimensional  MRCP images were rendered by post processing. CONTRAST:  8mL GADAVIST GADOBUTROL 1 MMOL/ML IV SOLN COMPARISON:  Ultrasound 09/12/2022.  CT 09/11/2022 FINDINGS: Lower chest: Trace bilateral pleural fluid. There is some adjacent signal changes along the lung. Favor atelectasis. Enteric tube in place extending into the stomach. Hepatobiliary: No biliary ductal dilatation. Common duct measures 5 mm. Gallbladder is distended with numerous stones including a stone towards the neck. There is also worsening gallbladder wall thickening and edema. Increasing adjacent ascites. Findings are worrisome for developing acute cholecystitis. No space-occupying liver lesion. Patent portal vein. There is some signal dropout in the liver on out of phase imaging consistent with a component of fatty liver infiltration. Pancreas: No mass, inflammatory changes, or other parenchymal abnormality identified. Spleen:  Within normal limits in size and appearance. Adrenals/Urinary Tract: Right adrenal gland is preserved. The left is minimally thickened. No enhancing renal mass or collecting system dilatation. Nonspecific perinephric stranding and fluid. Stomach/Bowel: Visualized bowel is nondilated. This includes in the visualized portions of the small and large bowel. There is a distal third portion duodenal diverticulum. Stomach is nondilated. Again enteric tube noted in the stomach. Vascular/Lymphatic: Atherosclerotic changes along the aorta. Normal caliber aorta and IVC. No specific abnormal lymph node enlargement identified in the abdomen but there are some prominent nodes in the porta hepatis and portacaval region. These could be reactive with the adjacent process. Other:  Slight areas of free fluid in the abdomen.  Mild anasarca. Musculoskeletal: Degenerative changes seen along the spine. IMPRESSION: Abnormal gallbladder with progressive wall thickening and edema. Gallstones with a stone towards the neck. Please correlate for acute  cholecystitis. No biliary ductal dilatation or clear biliary ductal filling defect. Mild fatty liver infiltration. Tiny pleural effusions with some adjacent atelectasis or infiltrate, new from previous CT Electronically Signed   By: Karen Kays M.D.   On: 09/15/2022 07:14   MR ABDOMEN MRCP W WO CONTAST  Result Date: 09/12/2022 CLINICAL DATA:  Right upper quadrant pain. EXAM: MRI ABDOMEN WITHOUT AND WITH CONTRAST (INCLUDING MRCP) TECHNIQUE: Multiplanar multisequence MR imaging of the abdomen was performed both before and after the administration of intravenous contrast. Heavily T2-weighted images of the biliary and pancreatic ducts were obtained, and three-dimensional MRCP images were rendered by post processing. CONTRAST:  8mL GADAVIST GADOBUTROL 1 MMOL/ML IV SOLN COMPARISON:  Ultrasound 09/12/2022.  CT 09/11/2022 FINDINGS: Lower chest: Trace bilateral pleural fluid. There is some adjacent signal changes along the lung. Favor atelectasis. Enteric tube in place extending into the stomach. Hepatobiliary: No biliary ductal dilatation. Common duct measures 5 mm. Gallbladder is distended with numerous stones including a stone towards the neck. There is also worsening gallbladder wall thickening and edema. Increasing adjacent ascites. Findings are worrisome for developing acute cholecystitis. No space-occupying liver lesion. Patent portal vein. There is some signal dropout in the liver on out of phase imaging consistent with a component of fatty liver infiltration. Pancreas: No mass, inflammatory changes, or other parenchymal abnormality identified. Spleen:  Within normal limits in size and appearance. Adrenals/Urinary Tract: Right adrenal gland is preserved. The left is minimally thickened. No enhancing renal mass or collecting system dilatation. Nonspecific perinephric stranding and fluid. Stomach/Bowel: Visualized bowel is nondilated. This includes in the visualized portions of the small and large bowel. There is a  distal third portion duodenal diverticulum. Stomach is nondilated. Again enteric tube noted in  the stomach. Vascular/Lymphatic: Atherosclerotic changes along the aorta. Normal caliber aorta and IVC. No specific abnormal lymph node enlargement identified in the abdomen but there are some prominent nodes in the porta hepatis and portacaval region. These could be reactive with the adjacent process. Other:  Slight areas of free fluid in the abdomen.  Mild anasarca. Musculoskeletal: Degenerative changes seen along the spine. IMPRESSION: Abnormal gallbladder with progressive wall thickening and edema. Gallstones with a stone towards the neck. Please correlate for acute cholecystitis. No biliary ductal dilatation or clear biliary ductal filling defect. Mild fatty liver infiltration. Tiny pleural effusions with some adjacent atelectasis or infiltrate, new from previous CT Electronically Signed   By: Karen Kays M.D.   On: 09/12/2022 16:17   US Abdomen Complete  Result Date: 09/12/2022 CLINICAL DATA:  Abdominal pain for 2 days with vomiting. EXAM: ABDOMEN ULTRASOUND COMPLETE COMPARISON:  CT scan of September 11, 2022. FINDINGS: Gallbladder: Cholelithiasis is noted. No sonographic Murphy's sign is noted. Largest calculus measures 1.5 cm. Moderate to severe gallbladder wall thickening is noted at 8 mm. Common bile duct: Diameter: 4 mm which is within normal limits. Liver: No focal lesion identified. Within normal limits in parenchymal echogenicity. Portal vein is patent on color Doppler imaging with normal direction of blood flow towards the liver. IVC: No abnormality visualized. Pancreas: Not visualized due to overlying bowel gas. Spleen: Size and appearance within normal limits. Right Kidney: Length: 10 cm. Echogenicity within normal limits. No mass or hydronephrosis visualized. Left Kidney: Length: 9 cm. Echogenicity within normal limits. No mass or hydronephrosis visualized. Abdominal aorta: No aneurysm visualized.  Other findings: None. IMPRESSION: Cholelithiasis with moderate to severe gallbladder wall thickening concerning for acute cholecystitis. No sonographic Murphy's sign is noted. HIDA scan may be performed for further evaluation. Electronically Signed   By: Lupita Raider M.D.   On: 09/12/2022 11:21   DG Chest Portable 1 View  Result Date: 09/12/2022 CLINICAL DATA:  line placement central EXAM: PORTABLE CHEST 1 VIEW COMPARISON:  Chest x-ray 09/11/2022 5 p.m., CT chest 09/11/2022 FINDINGS: Right internal jugular central venous catheter with tip overlying the right atrium but poorly visualized due to overlying EKG leads. Enteric tube courses below the hemidiaphragm with tip and side port overlying the expected region of the gastric lumen. The heart and mediastinal contours are within normal limits. No focal consolidation. No pulmonary edema. No pleural effusion. No pneumothorax. No acute osseous abnormality. IMPRESSION: 1. Right internal jugular central venous catheter with tip overlying the right atrium but poorly visualized due to overlying EKG leads. 2. Enteric tube in good position. 3. No acute cardiopulmonary abnormality. Electronically Signed   By: Tish Frederickson M.D.   On: 09/12/2022 02:10   DG Chest Port 1 View  Result Date: 09/11/2022 CLINICAL DATA:  Nasogastric tube placement EXAM: PORTABLE CHEST 1 VIEW COMPARISON:  Same date chest CT FINDINGS: Tip and side port of the enteric tube below the diaphragm in the stomach. Normal heart size and mediastinal contours. No focal airspace disease, pleural effusion or pneumothorax. Thoracic spondylosis. IMPRESSION: Tip and side port of the enteric tube below the diaphragm in the stomach. Electronically Signed   By: Narda Rutherford M.D.   On: 09/11/2022 22:31   CT CHEST ABDOMEN PELVIS W CONTRAST  Result Date: 09/11/2022 CLINICAL DATA:  Sepsis, general abdominal and back pain, chills, EXAM: CT CHEST, ABDOMEN, AND PELVIS WITH CONTRAST TECHNIQUE: Multidetector CT  imaging of the chest, abdomen and pelvis was performed following the standard protocol during  bolus administration of intravenous contrast. RADIATION DOSE REDUCTION: This exam was performed according to the departmental dose-optimization program which includes automated exposure control, adjustment of the mA and/or kV according to patient size and/or use of iterative reconstruction technique. CONTRAST:  OMNIPAQUE IOHEXOL 300 MG/ML  SOLN COMPARISON:  None available FINDINGS: CT CHEST FINDINGS Cardiovascular: Normal heart size. Coronary artery and aortic atherosclerotic calcification. No pericardial effusion. Mediastinum/Nodes: Trachea and esophagus are unremarkable. No thoracic adenopathy. Lungs/Pleura: No focal consolidation, pleural effusion, or pneumothorax. Musculoskeletal: No acute fracture. Cervical spine fusion hardware. Advanced arthritis both shoulders. CT ABDOMEN PELVIS FINDINGS Hepatobiliary: Cholelithiasis. Question mild gallbladder wall thickening. The gallbladder is mildly distended. The gallbladder is mildly distended. No pericholecystic fluid or stranding. No biliary dilation. Unremarkable liver. Pancreas: Unremarkable. Spleen: Unremarkable. Adrenals/Urinary Tract: Normal adrenal glands. No urinary calculi or hydronephrosis. Unremarkable bladder. Stomach/Bowel: Normal caliber large and small bowel. Normal appendix. No bowel wall thickening. Stomach is within normal limits. Vascular/Lymphatic: Aortic atherosclerosis. 1.1 cm enhancing nodule in the porta hepatis (series 2/image 63 and 3/62). Reproductive: Uterus and bilateral adnexa are unremarkable. Other: No free intraperitoneal fluid or air. Musculoskeletal: No acute fracture. IMPRESSION: 1. Cholelithiasis with questionable mild gallbladder wall thickening. If there is clinical concern for acute cholecystitis, consider further evaluation with ultrasound. 2. 1.1 cm enhancing nodule in the porta hepatis. This may represent a lymph node. Short  interval follow-up in 3 months is recommended. Aortic Atherosclerosis (ICD10-I70.0). Electronically Signed   By: Minerva Fester M.D.   On: 09/11/2022 20:27   CT Head Wo Contrast  Result Date: 09/04/2022 CLINICAL DATA:  Head trauma, loss of consciousness.  Fall. EXAM: CT HEAD WITHOUT CONTRAST TECHNIQUE: Contiguous axial images were obtained from the base of the skull through the vertex without intravenous contrast. RADIATION DOSE REDUCTION: This exam was performed according to the departmental dose-optimization program which includes automated exposure control, adjustment of the mA and/or kV according to patient size and/or use of iterative reconstruction technique. COMPARISON:  None Available. FINDINGS: Brain: No acute intracranial hemorrhage, midline shift or mass effect. No extra-axial fluid collection. Mild periventricular white matter hypodensities are present bilaterally. No hydrocephalus. Vascular: No hyperdense vessel or unexpected calcification. Skull: Normal. Negative for fracture or focal lesion. Sinuses/Orbits: No acute finding. Other: Increased density is noted in the scalp over the parietal bone on the left, suggesting contusion. IMPRESSION: No acute intracranial process. Electronically Signed   By: Thornell Sartorius M.D.   On: 09/04/2022 20:19    Micro Results   *** Recent Results (from the past 240 hour(s))  SARS Coronavirus 2 by RT PCR (hospital order, performed in [facility] hospital lab) *cepheid single result test* Anterior Nasal Swab     Status: None   Collection Time: 09/11/22  5:48 PM   Specimen: Anterior Nasal Swab  Result Value Ref Range Status   SARS Coronavirus 2 by RT PCR NEGATIVE NEGATIVE Final    Comment: (NOTE) SARS-CoV-2 target nucleic acids are NOT DETECTED.  The SARS-CoV-2 RNA is generally detectable in upper and lower respiratory specimens during the acute phase of infection. The lowest concentration of SARS-CoV-2 viral copies this assay can detect is 250 copies /  mL. A negative result does not preclude SARS-CoV-2 infection and should not be used as the sole basis for treatment or other patient management decisions.  A negative result may occur with improper specimen collection / handling, submission of specimen other than nasopharyngeal swab, presence of viral mutation(s) within the areas targeted by this assay, and inadequate number  of viral copies (<250 copies / mL). A negative result must be combined with clinical observations, patient history, and epidemiological information.  Fact Sheet for Patients:   RoadLapTop.co.za  Fact Sheet for Healthcare Providers: http://kim-miller.com/  This test is not yet approved or  cleared by the [place] FDA and has been authorized for detection and/or diagnosis of SARS-CoV-2 by FDA under an Emergency Use Authorization (EUA).  This EUA will remain in effect (meaning this test can be used) for the duration of the COVID-19 declaration under Section 564(b)(1) of the Act, 21 U.S.C. section 360bbb-3(b)(1), unless the authorization is terminated or revoked sooner.  Performed at [facility], [address] [place], [place] [address]   Culture, blood (single)     Status: Abnormal   Collection Time: 09/11/22  7:27 PM   Specimen: Left Antecubital; Blood  Result Value Ref Range Status   Specimen Description   Final    LEFT ANTECUBITAL Performed at [facility], [address] [place], [place] [address]    Special Requests   Final    BOTTLES DRAWN AEROBIC AND ANAEROBIC Blood Culture adequate volume Performed at [facility], [address] [place], [place] [address]    Culture  Setup Time   Final    GRAM NEGATIVE RODS IN BOTH AEROBIC AND ANAEROBIC BOTTLES Gram Stain Report Called to,Read Back By and Verified With: Paulo Fruit @0740  ON [id number] BY HENDERSONL CRITICAL RESULT CALLED TO, READ BACK BY AND VERIFIED WITH: Barbaraann Barthel [id number] AT 1417 BY CM Performed  at [facility], [address] [place], [place] [address]    Culture ESCHERICHIA COLI KLEBSIELLA OXYTOCA  (A)  Final   Report Status 09/14/2022 FINAL  Final   Organism ID, Bacteria ESCHERICHIA COLI  Final   Organism ID, Bacteria KLEBSIELLA OXYTOCA  Final      Susceptibility   Escherichia coli - MIC*    AMPICILLIN 8 SENSITIVE Sensitive     CEFEPIME <=0.12 SENSITIVE Sensitive     CEFTAZIDIME <=1 SENSITIVE Sensitive     CEFTRIAXONE <=0.25 SENSITIVE Sensitive     CIPROFLOXACIN <=0.25 SENSITIVE Sensitive     GENTAMICIN <=1 SENSITIVE Sensitive     IMIPENEM <=0.25 SENSITIVE Sensitive     TRIMETH/SULFA <=20 SENSITIVE Sensitive     AMPICILLIN/SULBACTAM 4 SENSITIVE Sensitive     PIP/TAZO <=4 SENSITIVE Sensitive     * ESCHERICHIA COLI   Klebsiella oxytoca - MIC*    AMPICILLIN >=32 RESISTANT Resistant     CEFEPIME <=0.12 SENSITIVE Sensitive     CEFTAZIDIME <=1 SENSITIVE Sensitive     CEFTRIAXONE <=0.25 SENSITIVE Sensitive     CIPROFLOXACIN <=0.25 SENSITIVE Sensitive     GENTAMICIN <=1 SENSITIVE Sensitive     IMIPENEM <=0.25 SENSITIVE Sensitive     TRIMETH/SULFA <=20 SENSITIVE Sensitive     AMPICILLIN/SULBACTAM 16 INTERMEDIATE Intermediate     PIP/TAZO <=4 SENSITIVE Sensitive     * KLEBSIELLA OXYTOCA  Blood Culture ID Panel (Reflexed)     Status: Abnormal   Collection Time: 09/11/22  7:27 PM  Result Value Ref Range Status   Enterococcus faecalis NOT DETECTED NOT DETECTED Final   Enterococcus Faecium NOT DETECTED NOT DETECTED Final   Listeria monocytogenes NOT DETECTED NOT DETECTED Final   Staphylococcus species NOT DETECTED NOT DETECTED Final   Staphylococcus aureus (BCID) NOT DETECTED NOT DETECTED Final   Staphylococcus epidermidis NOT DETECTED NOT DETECTED Final   Staphylococcus lugdunensis NOT DETECTED NOT DETECTED Final   Streptococcus species NOT DETECTED NOT  DETECTED Final   Streptococcus agalactiae NOT DETECTED NOT DETECTED Final   Streptococcus pneumoniae NOT DETECTED  NOT DETECTED Final   Streptococcus pyogenes NOT DETECTED NOT DETECTED Final   A.calcoaceticus-baumannii NOT DETECTED NOT DETECTED Final   Bacteroides fragilis NOT DETECTED NOT DETECTED Final   Enterobacterales DETECTED (A) NOT DETECTED Final    Comment: CRITICAL RESULT CALLED TO, READ BACK BY AND VERIFIED WITH: Marita Snellen [id number] AT 1417 BY CM    Enterobacter cloacae complex NOT DETECTED NOT DETECTED Final   Escherichia coli DETECTED (A) NOT DETECTED Final    Comment: CRITICAL RESULT CALLED TO, READ BACK BY AND VERIFIED WITH: Marita Snellen [id number] AT 1417 BY CM    Klebsiella aerogenes NOT DETECTED NOT DETECTED Final   Klebsiella oxytoca DETECTED (A) NOT DETECTED Final    Comment: CRITICAL RESULT CALLED TO, READ BACK BY AND VERIFIED WITH: Marita Snellen [id number] AT 1417 BY CM    Klebsiella pneumoniae NOT DETECTED NOT DETECTED Final   Proteus species NOT DETECTED NOT DETECTED Final   Salmonella species NOT DETECTED NOT DETECTED Final   Serratia marcescens NOT DETECTED NOT DETECTED Final   Haemophilus influenzae NOT DETECTED NOT DETECTED Final   Neisseria meningitidis NOT DETECTED NOT DETECTED Final   Pseudomonas aeruginosa NOT DETECTED NOT DETECTED Final   Stenotrophomonas maltophilia NOT DETECTED NOT DETECTED Final   Candida albicans NOT DETECTED NOT DETECTED Final   Candida auris NOT DETECTED NOT DETECTED Final   Candida glabrata NOT DETECTED NOT DETECTED Final   Candida krusei NOT DETECTED NOT DETECTED Final   Candida parapsilosis NOT DETECTED NOT DETECTED Final   Candida tropicalis NOT DETECTED NOT DETECTED Final   Cryptococcus neoformans/gattii NOT DETECTED NOT DETECTED Final   CTX-M ESBL NOT DETECTED NOT DETECTED Final   Carbapenem resistance IMP NOT DETECTED NOT DETECTED Final   Carbapenem resistance KPC NOT DETECTED NOT DETECTED Final   Carbapenem resistance NDM NOT DETECTED NOT DETECTED Final   Carbapenem resist OXA 48 LIKE NOT DETECTED NOT DETECTED Final    Carbapenem resistance VIM NOT DETECTED NOT DETECTED Final    Comment: Performed at [facility], [address], [place], [place] [address]  Blood culture (routine x 2)     Status: Abnormal   Collection Time: 09/11/22  8:47 PM   Specimen: BLOOD  Result Value Ref Range Status   Specimen Description   Final    BLOOD BLOOD LEFT HAND Performed at [facility], [address] [place], [place] [address]    Special Requests   Final    BOTTLES DRAWN AEROBIC AND ANAEROBIC Blood Culture adequate volume Performed at [facility], [address], [place], [place] [address]    Culture  Setup Time   Final    ANAEROBIC BOTTLE ONLY GRAM POSITIVE COCCI Gram Stain Report Called to,Read Back By and Verified With: GEORGE RUSS 09/12/22 1455 NN  CONFIRMED WITH LH CRITICAL RESULT CALLED TO, READ BACK BY AND VERIFIED WITH: RN JESSICA ELLER ON 09/12/22 @ 2259 BY DRT    Culture (A)  Final    STAPHYLOCOCCUS EPIDERMIDIS THE SIGNIFICANCE OF ISOLATING THIS ORGANISM FROM A SINGLE SET OF BLOOD CULTURES WHEN MULTIPLE SETS ARE DRAWN IS UNCERTAIN. PLEASE NOTIFY THE MICROBIOLOGY DEPARTMENT WITHIN ONE WEEK IF SPECIATION AND SENSITIVITIES ARE REQUIRED. Performed at [facility], [address], [place], [place] [address]    Report Status 09/14/2022 FINAL  Final  Blood Culture ID Panel (Reflexed)     Status: Abnormal  Collection Time: 09/11/22  8:47 PM  Result Value Ref Range Status   Enterococcus faecalis NOT DETECTED NOT DETECTED Final   Enterococcus Faecium NOT DETECTED NOT DETECTED Final   Listeria monocytogenes NOT DETECTED NOT DETECTED Final   Staphylococcus species DETECTED (A) NOT DETECTED Final    Comment: CRITICAL RESULT CALLED TO, READ BACK BY AND VERIFIED WITH: RN JESSICA ELLER ON 09/12/22 @ 2259 BY DRT    Staphylococcus aureus (BCID) NOT DETECTED NOT DETECTED Final   Staphylococcus epidermidis DETECTED (A) NOT DETECTED Final    Comment: CRITICAL RESULT CALLED TO, READ BACK BY AND VERIFIED  WITH: RN JESSICA ELLER ON 09/12/22 @ 2259 BY DRT    Staphylococcus lugdunensis NOT DETECTED NOT DETECTED Final   Streptococcus species NOT DETECTED NOT DETECTED Final   Streptococcus agalactiae NOT DETECTED NOT DETECTED Final   Streptococcus pneumoniae NOT DETECTED NOT DETECTED Final   Streptococcus pyogenes NOT DETECTED NOT DETECTED Final   A.calcoaceticus-baumannii NOT DETECTED NOT DETECTED Final   Bacteroides fragilis NOT DETECTED NOT DETECTED Final   Enterobacterales NOT DETECTED NOT DETECTED Final   Enterobacter cloacae complex NOT DETECTED NOT DETECTED Final   Escherichia coli NOT DETECTED NOT DETECTED Final   Klebsiella aerogenes NOT DETECTED NOT DETECTED Final   Klebsiella oxytoca NOT DETECTED NOT DETECTED Final   Klebsiella pneumoniae NOT DETECTED NOT DETECTED Final   Proteus species NOT DETECTED NOT DETECTED Final   Salmonella species NOT DETECTED NOT DETECTED Final   Serratia marcescens NOT DETECTED NOT DETECTED Final   Haemophilus influenzae NOT DETECTED NOT DETECTED Final   Neisseria meningitidis NOT DETECTED NOT DETECTED Final   Pseudomonas aeruginosa NOT DETECTED NOT DETECTED Final   Stenotrophomonas maltophilia NOT DETECTED NOT DETECTED Final   Candida albicans NOT DETECTED NOT DETECTED Final   Candida auris NOT DETECTED NOT DETECTED Final   Candida glabrata NOT DETECTED NOT DETECTED Final   Candida krusei NOT DETECTED NOT DETECTED Final   Candida parapsilosis NOT DETECTED NOT DETECTED Final   Candida tropicalis NOT DETECTED NOT DETECTED Final   Cryptococcus neoformans/gattii NOT DETECTED NOT DETECTED Final   Methicillin resistance mecA/C NOT DETECTED NOT DETECTED Final    Comment: Performed at [facility], [address], [place], [place] [address]  MRSA Next Gen by PCR, Nasal     Status: Abnormal   Collection Time: 09/12/22  5:51 AM   Specimen: Nasal Mucosa; Nasal Swab  Result Value Ref Range Status   MRSA by PCR Next Gen DETECTED (A) NOT DETECTED Final     Comment: RESULT CALLED TO, READ BACK BY AND VERIFIED WITH: HYLTON L @ 0932 ON [id number] BY HENDERSON L (NOTE) The GeneXpert MRSA Assay (FDA approved for NASAL specimens only), is one component of a comprehensive MRSA colonization surveillance program. It is not intended to diagnose MRSA infection nor to guide or monitor treatment for MRSA infections. Test performance is not FDA approved in patients less than 82 years old. Performed at [facility], [address], [place], [place] [address]     Today   Subjective    Tiffanny Konzen today has no ***          Patient has been seen and examined prior to discharge   Objective   Blood pressure (!) 143/71, pulse 64, temperature 98.9 F (37.2 C), resp. rate 16, SpO2 97%.   Intake/Output Summary (Last 24 hours) at 09/16/2022 1131 Last data filed at 09/16/2022 0900 Gross per 24 hour  Intake 740 ml  Output 355 ml  Net 385 ml    Exam Gen:- Awake Alert, no acute distress *** HEENT:- [place].AT, No sclera icterus Neck-Supple Neck,No JVD,.  Lungs-  CTAB , good air movement bilaterally CV- S1, S2 normal, regular Abd-  +ve B.Sounds, Abd Soft, No tenderness,    Extremity/Skin:- No  edema,   good pulses Psych-affect is appropriate, oriented x3 Neuro-no new focal deficits, no tremors ***   Data Review   CBC w Diff:  Lab Results  Component Value Date   WBC 4.2 09/15/2022   HGB 10.9 (L) 09/15/2022   HCT 33.4 (L) 09/15/2022   PLT 166 09/15/2022   LYMPHOPCT 29 06/22/2014   MONOPCT 8 06/22/2014   EOSPCT 7 (H) 06/22/2014   BASOPCT 0 06/22/2014    CMP:  Lab Results  Component Value Date   NA 133 (L) 09/16/2022   K 4.1 09/16/2022   CL 102 09/16/2022   CO2 23 09/16/2022   BUN 16 09/16/2022   CREATININE 0.87 09/16/2022   PROT 5.8 (L) 09/16/2022   ALBUMIN 2.6 (L) 09/16/2022   BILITOT 0.9 09/16/2022   ALKPHOS 140 (H) 09/16/2022   AST 89 (H) 09/16/2022   ALT 76 (H) 09/16/2022  .  Total Discharge time is about 33 minutes  Shon Hale M.D on 09/16/2022 at 11:31 AM  Go to [email or web address] -  for contact info  [facility] - Office  [phone]

## 2022-09-17 ENCOUNTER — Ambulatory Visit: Payer: Self-pay | Admitting: *Deleted

## 2022-09-17 ENCOUNTER — Telehealth: Payer: Self-pay | Admitting: *Deleted

## 2022-09-17 NOTE — Patient Outreach (Incomplete)
  Care Coordination   Initial Visit Note   09/18/2022 Name: NAJMA WELLMAN MRN: [id number] DOB: 1950/05/29  MEHJABEEN TORRIS is a 72 y.o. year old female who sees No primary care provider on file. for primary care. I spoke with  [name] by phone today.  What matters to the patients health and wellness today?  Some abdominal pain when moving only    Need incentive spirometry Her hospital discharge MD request she use one at 1200 mg for prevention of pneumonia. She can not find her device  Diabetes stop metformin & now managed with diet control Had some elevations in hospital.  Voiced understanding of checking the glucose at interval at home after discharge - being proactive Unable to find her glucose meter. Agrees to allow RN CM to call to her pcp for a prescription to be sent to her pharmacy   Hypertension (HTN)Had some low blood pressure (BP), hypotension, while hospitalized. Has no BP cuff at home to check prior to taking ordered HTN medicines, Needs a cuff    Goals Addressed             This Visit's Progress    home management of chronic medical conditions hypotension, diabetes,prevention of pneumonia after hospitalizaton -- care coordination services RN CM   Not on track    Interventions Today    Flowsheet Row Most Recent Value  Chronic Disease   Chronic disease during today's visit Diabetes, Hypertension (HTN), Other  General Interventions   General Interventions Discussed/Reviewed General Interventions Discussed, Doctor Visits, Durable Medical Equipment (DME), Communication with  Durable Medical Equipment (DME) BP Cuff, Glucomoter, Other  [need BP cuff , glucometer and incentive spirometer]  Communication with PCP/Specialists, RN  [Email to her pcp to request prescription be sent to her [facility] for glucose meter, Inquired about incentive spirometer  called her insurance over the counter benefit, [employer] [id number] confirmed quarterly amount of $50- none left  thisquarter]  Exercise Interventions   Exercise Discussed/Reviewed Physical Activity, Exercise Discussed, Assistive device use and maintanence  Physical Activity Discussed/Reviewed Physical Activity Reviewed  Education Interventions   Education Provided Provided Web-based Education, Provided Education  [the importance of monitoring the blood glucose, blood pressure and using an incentive spirometer Hypotension, gluse spikes with infection + articles web base for hypotension, sick day diabetes care, incentive spirometer]  Provided Verbal Education On Labs, Medication  Labs Reviewed Hgb A1c  Mental Health Interventions   Mental Health Discussed/Reviewed Mental Health Discussed  Nutrition Interventions   Nutrition Discussed/Reviewed Nutrition Discussed, Fluid intake, Decreasing sugar intake  Pharmacy Interventions   Pharmacy Dicussed/Reviewed Pharmacy Topics Discussed, Medications and their functions, Affording Medications  Safety Interventions   Safety Discussed/Reviewed Safety Discussed  Advanced Directive Interventions   Advanced Directives Discussed/Reviewed Advanced Directives Discussed  [confirm she is not ready to complete forms and will know]              SDOH assessments and interventions completed:  Yes  SDOH Interventions Today    Flowsheet Row Most Recent Value  SDOH Interventions   Food Insecurity Interventions Intervention Not Indicated  Housing Interventions Intervention Not Indicated  Utilities Interventions Intervention Not Indicated        Care Coordination Interventions:  Yes, provided   Follow up plan: Follow up call scheduled for 09/29/22     Encounter Outcome:  Patient Visit Completed   [name], RN, BSN, CCM, Care Management Coordinator [phone]

## 2022-09-17 NOTE — Progress Notes (Signed)
  Care Coordination   Note   09/17/2022 Name: SHAWNTELL MORSEY MRN: [id number] DOB: Feb 21, 1950  RAHCEL REINHEIMER is a 72 y.o. year old female who sees [name], NP for primary care. I reached out to [name] by phone today to offer care coordination services.  Ms. Apollo was given information about Care Coordination services today including:   The Care Coordination services include support from the care team which includes your Nurse Coordinator, Clinical Social Worker, or Pharmacist.  The Care Coordination team is here to help remove barriers to the health concerns and goals most important to you. Care Coordination services are voluntary, and the patient may decline or stop services at any time by request to their care team member.   Care Coordination Consent Status: Patient agreed to services and verbal consent obtained.   Follow up plan:  Telephone appointment with care coordination team member scheduled for:  09/17/22  Encounter Outcome:  Patient Scheduled  [facility] Coordination Care Guide  Direct Dial: [phone]

## 2022-09-18 NOTE — Patient Instructions (Addendum)
Visit Information  Thank you for taking time to visit with me today. Please don't hesitate to contact me if I can be of assistance to you.   Following are the goals we discussed today:   Goals Addressed             This Visit's Progress    home management of chronic medical conditions hypotension, diabetes,prevention of pneumonia after hospitalizaton -- care coordination services RN CM   Not on track    Interventions Today    Flowsheet Row Most Recent Value  Chronic Disease   Chronic disease during today's visit Diabetes, Hypertension (HTN), Other  General Interventions   General Interventions Discussed/Reviewed General Interventions Discussed, Doctor Visits, Durable Medical Equipment (DME), Communication with  Durable Medical Equipment (DME) BP Cuff, Glucomoter, Other  [need BP cuff , glucometer and incentive spirometer]  Communication with PCP/Specialists, RN  [Email to her pcp to request prescription be [facility] for glucose meter, Inquired about incentive spirometer  called her insurance over the counter benefit, [employer] [id number] confirmed quarterly amount of $50- none left thisquarter]  Exercise Interventions   Exercise Discussed/Reviewed Physical Activity, Exercise Discussed, Assistive device use and maintanence  Physical Activity Discussed/Reviewed Physical Activity Reviewed  Education Interventions   Education Provided Provided Web-based Education, Provided Education  [the importance of monitoring the blood glucose, blood pressure and using an incentive spirometer Hypotension, gluse spikes with infection + articles web base for hypotension, sick day diabetes care, incentive spirometer]  Provided Verbal Education On Labs, Medication  Labs Reviewed Hgb A1c  Mental Health Interventions   Mental Health Discussed/Reviewed Mental Health Discussed  Nutrition Interventions   Nutrition Discussed/Reviewed Nutrition Discussed, Fluid intake, Decreasing sugar intake   Pharmacy Interventions   Pharmacy Dicussed/Reviewed Pharmacy Topics Discussed, Medications and their functions, Affording Medications  Safety Interventions   Safety Discussed/Reviewed Safety Discussed  Advanced Directive Interventions   Advanced Directives Discussed/Reviewed Advanced Directives Discussed  [confirm she is not ready to complete forms and will know]              Our next appointment is by telephone on 09/29/22 at 3:30 pm  Please call the care guide team at [phone] if you need to cancel or reschedule your appointment.   If you are experiencing a Mental Health or Behavioral Health Crisis or need someone to talk to, please call the Suicide and Crisis Lifeline: 988 call the [place] National Suicide Prevention Lifeline: (339)643-4508 or TTY: 785-058-7938 TTY (352)732-2802) to talk to a trained counselor call 1-800-273-TALK (toll free, 24 hour hotline) call the [facility]: [phone] [phone] call 911   Patient verbalizes understanding of instructions and care plan provided today and agrees to view in MyChart. Active MyChart status and patient understanding of how to access instructions and care plan via MyChart confirmed with patient.     The patient has been provided with contact information for the care management team and has been advised to call with any health related questions or concerns.    Danni Leabo L. Noelle Penner, RN, BSN, CCM, Care Management Coordinator [phone]

## 2022-09-19 ENCOUNTER — Telehealth: Payer: Self-pay | Admitting: Pharmacist

## 2022-09-19 ENCOUNTER — Ambulatory Visit: Payer: Self-pay | Admitting: *Deleted

## 2022-09-19 LAB — SURGICAL PATHOLOGY

## 2022-09-19 NOTE — Patient Instructions (Signed)
Visit Information  Thank you for taking time to visit with me today. Please don't hesitate to contact me if I can be of assistance to you.   Following are the goals we discussed today:   Goals Addressed             This Visit's Progress    home management of chronic medical conditions hypotension, diabetes,prevention of pneumonia after hospitalizaton -- care coordination services RN CM   Not on track    Interventions Today    Flowsheet Row Most Recent Value  Chronic Disease   Chronic disease during today's visit Diabetes, Other  [Pending glucose prescription to her pharmacy, use of her credits for over the counter for a blood pressure cuff, confirmed she has the spirometer left on her porch, Diarrhea (?from antibiotics)]  General Interventions   General Interventions Discussed/Reviewed General Interventions Reviewed, Durable Medical Equipment (DME), Sick Day Rules, [facility], Communication with  Horticulturist, commercial (DME) BP Cuff, Glucomoter, Other  [spirometer]  Communication with PCP/Specialists  [re sent the email to pcp office requesting prescription to be sent to her pharmacy for a glucometer, re routed patient note to her pcp]  Education Interventions   Education Provided --  [use of over the counter resource for blood pressure cuff, BRAT (banana, Rice, Applesauce, Toast) diet for diarrhea, encouraged to continue immodium and report to medical providers if diarrhea continues]  Provided Verbal Education On Nutrition, Blood Sugar Monitoring, Medication, Sick Day Rules, [facility], [employer]  [discussed how some antibiotics can irritate the stomach Patient noted to be on flagyl and Cefdinir]  Nutrition Interventions   Nutrition Discussed/Reviewed Nutrition Reviewed, Supplemental nutrition  [BRAT diet for diarrhea]  Pharmacy Interventions   Pharmacy Dicussed/Reviewed Pharmacy Topics Reviewed, Medications and their functions              Our next  appointment is by telephone on 09/29/22 at 3:30 pm  Please call the care guide team at [phone] if you need to cancel or reschedule your appointment.   If you are experiencing a Mental Health or Behavioral Health Crisis or need someone to talk to, please call the Suicide and Crisis Lifeline: 988 call the [place] National Suicide Prevention Lifeline: 302-046-6371 or TTY: 650-514-1498 TTY 202-119-9962) to talk to a trained counselor call 1-800-273-TALK (toll free, 24 hour hotline) call the [facility]: (564)872-8755 call 911   Patient verbalizes understanding of instructions and care plan provided today and agrees to view in MyChart. Active MyChart status and patient understanding of how to access instructions and care plan via MyChart confirmed with patient.     The patient has been provided with contact information for the care management team and has been advised to call with any health related questions or concerns.   Atom Solivan L. Noelle Penner, RN, BSN, CCM, Care Management Coordinator [phone]

## 2022-09-19 NOTE — Progress Notes (Signed)
Pharmacy - Antimicrobial Stewardship  Received information that the K oxytoca in patients blood culture from 9/5 was resistant to Cefazolin and patient sent home on cefdinir and flagyl.  Since resistant to cefazolin, oral cephalosporins should be avoided (even if susceptible to ceftriaxone).   (Note: blood culture also had E coli). Contacted hospitalist, Dr Gwenlyn Perking, to let him know result.  Plan was to call patient and if doing well to call in new antibiotic prescription for cefdinir.   Patient stated she was doing well.  Explained concern with using cefdinir and lack of effectiveness to treat one of the bacteria in her blood culture.    New prescription for bactrim 1 DS x 5 days to be called in by Dr [facility]wen[facility] Perking [facility] [place].  Patient made aware of the change and explained how to take and side effects.   Juliette Alcide, PharmD, BCPS, BCIDP Work Cell: [phone] 09/19/2022 2:58 PM

## 2022-09-19 NOTE — Patient Outreach (Signed)
Care Coordination   Follow Up Visit Note   09/19/2022 Name: Gloria Rose MRN: [id number] DOB: 1950/01/11  Gloria Rose is a 72 y.o. year old female who sees No primary care provider on file. for primary care. I spoke with  Gloria Rose by phone today.  What matters to the patients health and wellness today?  Diarrhea/BRAT diet, pending glucose meter prescription, over the counter benefit for equipment,  Voiced understanding of RN CM message sent to pcp office and 09/18/22 1 pm office visit related to her spirometer and glucose meter prescription She voices she will use the BRAT (banana rice applesauce toast) diet for her diarrhea and will call medical providers if diarrhea continues    Goals Addressed             This Visit's Progress    home management of chronic medical conditions hypotension, diabetes,prevention of pneumonia after hospitalizaton -- care coordination services RN CM   Not on track    Interventions Today    Flowsheet Row Most Recent Value  Chronic Disease   Chronic disease during today's visit Diabetes, Other  [Pending glucose prescription to her pharmacy, use of her credits for over the counter for a blood pressure cuff, confirmed she has the spirometer left on her porch, Diarrhea (?from antibiotics)]  General Interventions   General Interventions Discussed/Reviewed General Interventions Reviewed, Durable Medical Equipment (DME), Sick Day Rules, [facility], Communication with  Horticulturist, commercial (DME) BP Cuff, Glucomoter, Other  [spirometer]  Communication with PCP/Specialists  [re sent the email to pcp office requesting prescription to be sent to her pharmacy for a glucometer, re routed patient note to her pcp]  Education Interventions   Education Provided --  [use of over the counter resource for blood pressure cuff, BRAT (banana, Rice, Applesauce, Toast) diet for diarrhea, encouraged to continue immodium and report to medical providers if  diarrhea continues]  Provided Verbal Education On Nutrition, Blood Sugar Monitoring, Medication, Sick Day Rules, [facility], [employer]  [discussed how some antibiotics can irritate the stomach Patient noted to be on flagyl and Cefdinir]  Nutrition Interventions   Nutrition Discussed/Reviewed Nutrition Reviewed, Supplemental nutrition  [BRAT diet for diarrhea]  Pharmacy Interventions   Pharmacy Dicussed/Reviewed Pharmacy Topics Reviewed, Medications and their functions              SDOH assessments and interventions completed:  No     Care Coordination Interventions:  Yes, provided   Follow up plan: Follow up call scheduled for 09/29/22    Encounter Outcome:  Patient Visit Completed   Gloria Rose L. Noelle Penner, RN, BSN, CCM, Care Management Coordinator [phone]

## 2022-09-23 ENCOUNTER — Telehealth: Payer: Self-pay | Admitting: *Deleted

## 2022-09-23 ENCOUNTER — Emergency Department (HOSPITAL_COMMUNITY)
Admission: EM | Admit: 2022-09-23 | Discharge: 2022-09-23 | Disposition: A | Discharge status: Home / Self Care | Payer: Medicare HMO | Attending: Student | Admitting: Student

## 2022-09-23 ENCOUNTER — Emergency Department (HOSPITAL_COMMUNITY): Payer: Medicare HMO

## 2022-09-23 ENCOUNTER — Other Ambulatory Visit: Payer: Self-pay

## 2022-09-23 ENCOUNTER — Ambulatory Visit: Payer: Self-pay | Admitting: *Deleted

## 2022-09-23 ENCOUNTER — Encounter (HOSPITAL_COMMUNITY): Payer: Self-pay | Admitting: *Deleted

## 2022-09-23 DIAGNOSIS — R42 Dizziness and giddiness: Secondary | ICD-10-CM | POA: Diagnosis not present

## 2022-09-23 DIAGNOSIS — Z7982 Long term (current) use of aspirin: Secondary | ICD-10-CM | POA: Diagnosis not present

## 2022-09-23 DIAGNOSIS — R1030 Lower abdominal pain, unspecified: Secondary | ICD-10-CM | POA: Diagnosis not present

## 2022-09-23 DIAGNOSIS — Z9049 Acquired absence of other specified parts of digestive tract: Secondary | ICD-10-CM | POA: Diagnosis not present

## 2022-09-23 LAB — CBC WITH DIFFERENTIAL/PLATELET
Abs Immature Granulocytes: 0.04 10*3/uL (ref 0.00–0.07)
Basophils Absolute: 0 10*3/uL (ref 0.0–0.1)
Basophils Relative: 0 %
Eosinophils Absolute: 0.5 10*3/uL (ref 0.0–0.5)
Eosinophils Relative: 5 %
HCT: 35.5 % — ABNORMAL LOW (ref 36.0–46.0)
Hemoglobin: 11.3 g/dL — ABNORMAL LOW (ref 12.0–15.0)
Immature Granulocytes: 0 %
Lymphocytes Relative: 18 %
Lymphs Abs: 1.7 10*3/uL (ref 0.7–4.0)
MCH: 27.1 pg (ref 26.0–34.0)
MCHC: 31.8 g/dL (ref 30.0–36.0)
MCV: 85.1 fL (ref 80.0–100.0)
Monocytes Absolute: 0.7 10*3/uL (ref 0.1–1.0)
Monocytes Relative: 7 %
Neutro Abs: 6.6 10*3/uL (ref 1.7–7.7)
Neutrophils Relative %: 70 %
Platelets: 443 10*3/uL — ABNORMAL HIGH (ref 150–400)
RBC: 4.17 MIL/uL (ref 3.87–5.11)
RDW: 14.8 % (ref 11.5–15.5)
WBC: 9.4 10*3/uL (ref 4.0–10.5)
nRBC: 0 % (ref 0.0–0.2)

## 2022-09-23 LAB — BASIC METABOLIC PANEL
Anion gap: 8 (ref 5–15)
BUN: 14 mg/dL (ref 8–23)
CO2: 25 mmol/L (ref 22–32)
Calcium: 9.5 mg/dL (ref 8.9–10.3)
Chloride: 101 mmol/L (ref 98–111)
Creatinine, Ser: 1.08 mg/dL — ABNORMAL HIGH (ref 0.44–1.00)
GFR, Estimated: 54 mL/min — ABNORMAL LOW (ref >60)
Glucose, Bld: 150 mg/dL — ABNORMAL HIGH (ref 70–99)
Potassium: 4.7 mmol/L (ref 3.5–5.1)
Sodium: 134 mmol/L — ABNORMAL LOW (ref 135–145)

## 2022-09-23 LAB — LIPASE, BLOOD: Lipase: 62 U/L — ABNORMAL HIGH (ref 11–51)

## 2022-09-23 LAB — POC OCCULT BLOOD, ED: Fecal Occult Bld: NEGATIVE

## 2022-09-23 MED ORDER — PANTOPRAZOLE SODIUM 40 MG IV SOLR
40.0000 mg | Freq: Once | INTRAVENOUS | Status: AC
  Administered 2022-09-23: 40 mg via INTRAVENOUS
  Filled 2022-09-23: qty 10

## 2022-09-23 MED ORDER — IOHEXOL 300 MG/ML  SOLN
100.0000 mL | Freq: Once | INTRAMUSCULAR | Status: AC | PRN
  Administered 2022-09-23: 100 mL via INTRAVENOUS

## 2022-09-23 MED ORDER — SODIUM CHLORIDE 0.9 % IV BOLUS
1000.0000 mL | Freq: Once | INTRAVENOUS | Status: AC
  Administered 2022-09-23: 1000 mL via INTRAVENOUS

## 2022-09-23 MED ORDER — LACTATED RINGERS IV BOLUS
1000.0000 mL | Freq: Once | INTRAVENOUS | Status: AC
  Administered 2022-09-23: 1000 mL via INTRAVENOUS

## 2022-09-23 NOTE — ED Notes (Signed)
Assisted pt with standing to check BP, denies any dizziness with standing in triage.

## 2022-09-23 NOTE — Patient Instructions (Signed)
Visit Information  Thank you for taking time to visit with me today. Please don't hesitate to contact me if I can be of assistance to you.   Following are the goals we discussed today:   Goals Addressed             This Visit's Progress    home management of chronic medical conditions hypotension, diabetes,prevention of pneumonia after XI ROBOTIC ASSISTED LAPAROSCOPIC CHOLECYSTECTOMY -- care coordination services RN CM   Not on track    Interventions Today    Flowsheet Row Most Recent Value  Chronic Disease   Chronic disease during today's visit Other, Hypertension (HTN), Diabetes  [s/p XI ROBOTIC ASSISTED LAPAROSCOPIC CHOLECYSTECTOMY with reported dark stools, BP reported within normal limits per patient, dizzy for 2 days, nausea no vomiting,Denies Headache or visual changes, Unable to check cbg awaiting test strips for new machine]  General Interventions   General Interventions Discussed/Reviewed General Interventions Reviewed, Communication with, Sick Day Rules, Horticulturist, commercial (DME)  Durable Medical Equipment (DME) Glucomoter, BP Cuff  Communication with --  [called with patient for any available pcpappointment- none open, pcp without urgent care services @this  time. assisted patient to locate an in network urgent care to get labs & exam,she expresses no preference for ED visit. called her surgeon officeLVM]  Education Interventions   Education Provided Provided Education  Provided Verbal Education On Labs, Blood Sugar Monitoring, Medication, When to see the doctor, [employer], [employer], Sick Day Rules  [encouraged not to take BP medicines with low BPs,]  Mental Health Interventions   Mental Health Discussed/Reviewed Mental Health Reviewed, Coping Strategies  Pharmacy Interventions   Pharmacy Dicussed/Reviewed Pharmacy Topics Reviewed, Medications and their functions              Our next appointment is by telephone on 09/29/22 at 3:30 pm  Please  call the care guide team at [phone] if you need to cancel or reschedule your appointment.   If you are experiencing a Mental Health or Behavioral Health Crisis or need someone to talk to, please call the Suicide and Crisis Lifeline: 988 call the [place] National Suicide Prevention Lifeline: (581)607-8482 or TTY: 249-547-6328 TTY 816-005-7506) to talk to a trained counselor call 1-800-273-TALK (toll free, 24 hour hotline) call the [facility]: [phone] call 911   Patient verbalizes understanding of instructions and care plan provided today and agrees to view in MyChart. Active MyChart status and patient understanding of how to access instructions and care plan via MyChart confirmed with patient.     The patient has been provided with contact information for the care management team and has been advised to call with any health related questions or concerns.    Shaunak Kreis L. Noelle Penner, RN, BSN, CCM, Care Management Coordinator [phone]

## 2022-09-23 NOTE — ED Provider Notes (Signed)
[place] [facility] DEPARTMENT AT [facility] Provider Note   CSN: [id number] Arrival date & time: 09/23/22  1513     History  Chief Complaint  Patient presents with   Dizziness    Gloria Rose is a 72 y.o. female.  Bed for septic shock secondary to cholecystitis on 9/5, ultimately had cholecystectomy on 9/9.  She was discharged on 9/10 and has been feeling okay until this morning, she woke up to try to get a bed and felt very dizzy and lightheaded like she was going to pass out when she tried to get a bed.  Her husband had to help her to the bathroom, she states she had a large bowel movement that "looked like black mashed potatoes".  She had several subsequent very dark to black bowel movements since then, has been intermittently feeling dizzy especially when she stands up but states after she stands for a little bit it seems to get better, did not pass out.  Some mild abdominal pain   Dizziness      Home Medications Prior to Admission medications   Medication Sig Start Date End Date Taking? Authorizing Provider  amLODipine (NORVASC) 5 MG tablet Take 5 mg by mouth at bedtime.    [provider]  aspirin EC 81 MG tablet Take 81 mg by mouth at bedtime.    [provider]  atenolol (TENORMIN) 100 MG tablet Take 100 mg by mouth at bedtime.    [provider]  docusate sodium (COLACE) 100 MG capsule Take 1 capsule (100 mg total) by mouth 2 (two) times daily. 09/15/22 09/15/23  Pappayliou, Santina Evans A, DO  hydrochlorothiazide (HYDRODIURIL) 25 MG tablet Take 0.5 tablets (12.5 mg total) by mouth at bedtime. 09/16/22   Shon Hale, MD  lisinopril (PRINIVIL,ZESTRIL) 20 MG tablet Take 20 mg by mouth at bedtime.    [provider]  omeprazole (PRILOSEC) 20 MG capsule Take 20 mg by mouth at bedtime.    [provider]  ondansetron (ZOFRAN) 4 MG tablet Take 1 tablet (4 mg total) by mouth daily as needed for nausea or vomiting. 09/15/22  09/15/23  Pappayliou, Santina Evans A, DO  oxyCODONE (ROXICODONE) 5 MG immediate release tablet Take 1 tablet (5 mg total) by mouth every 6 (six) hours as needed. 09/15/22   Pappayliou, Santina Evans A, DO  pravastatin (PRAVACHOL) 40 MG tablet Take 40 mg by mouth at bedtime.    [provider]  sertraline (ZOLOFT) 50 MG tablet Take 50 mg by mouth daily. 08/09/22   [provider]  traZODone (DESYREL) 100 MG tablet Take 1 tablet (100 mg total) by mouth at bedtime as needed for sleep. 09/16/22   Shon Hale, MD  Vitamin D, Ergocalciferol, (DRISDOL) 1.25 MG (50000 UNIT) CAPS capsule Take 50,000 Units by mouth every 7 (seven) days. 05/30/22   [provider]      Allergies    Penicillins    Review of Systems   Review of Systems  Neurological:  Positive for dizziness.    Physical Exam Updated Vital Signs BP 107/66 (BP Location: Right Arm)   Pulse 62   Temp 98.8 F (37.1 C) (Oral)   Resp 18   Ht 5\' 6"  (1.676 m)   Wt 81.6 kg   SpO2 97%   BMI 29.05 kg/m  Physical Exam Vitals and nursing note reviewed. Exam conducted with a chaperone present.  Constitutional:      General: She is not in acute distress.    Appearance:  She is well-developed.  HENT:     Head: Normocephalic and atraumatic.     Mouth/Throat:     Mouth: Mucous membranes are moist.  Eyes:     Conjunctiva/sclera: Conjunctivae normal.  Cardiovascular:     Rate and Rhythm: Normal rate and regular rhythm.     Heart sounds: No murmur heard. Pulmonary:     Effort: Pulmonary effort is normal. No respiratory distress.     Breath sounds: Normal breath sounds.  Abdominal:     Palpations: Abdomen is soft.     Tenderness: There is no abdominal tenderness.     Comments: Well-healing surgical incisions on abdomen from cholecystectomy, minimal tenderness to lower abdomen   Genitourinary:    Rectum: Normal. Guaiac result negative.     Comments: Rectal vault empty, tiny amount of brown-colored mucus on glove which  is guaiac negative, controls reacted appropriately Musculoskeletal:        General: No swelling.     Cervical back: Neck supple.  Skin:    General: Skin is warm and dry.     Capillary Refill: Capillary refill takes less than 2 seconds.  Neurological:     General: No focal deficit present.     Mental Status: She is alert and oriented to person, place, and time.     Sensory: No sensory deficit.     Motor: No weakness.  Psychiatric:        Mood and Affect: Mood normal.     ED Results / Procedures / Treatments   Labs (all labs ordered are listed, but only abnormal results are displayed) Labs Reviewed  CBC WITH DIFFERENTIAL/PLATELET - Abnormal; Notable for the following components:      Result Value   Hemoglobin 11.3 (*)    HCT 35.5 (*)    Platelets 443 (*)    All other components within normal limits  BASIC METABOLIC PANEL - Abnormal; Notable for the following components:   Sodium 134 (*)    Glucose, Bld 150 (*)    Creatinine, Ser 1.08 (*)    GFR, Estimated 54 (*)    All other components within normal limits  LIPASE, BLOOD - Abnormal; Notable for the following components:   Lipase 62 (*)    All other components within normal limits  POC OCCULT BLOOD, ED    EKG None  Radiology No results found.  Procedures Procedures    Medications Ordered in ED Medications  lactated ringers bolus 1,000 mL (has no administration in time range)  sodium chloride 0.9 % bolus 1,000 mL (has no administration in time range)    ED Course/ Medical Decision Making/ A&P Clinical Course as of 09/23/22 1854  Tue Sep 23, 2022  1853 CBC with Differential(!) [CB]    Clinical Course User Index [CB] Ma Rings, PA-C                                 Medical Decision Making Ddx: gastritis, gastroenteritis, appendicitis, cholecystitis, diverticulitis, DKA, nephrolithiasis, gastroparesis, other   Course: Patient here for black stools diarrhea and near syncope after cholecystectomy  about 10 days ago, blood pressure soft initially here but improved, will give some IV fluids, obtain labs, exam has no melena, small amount of brown mucousy material on glove that is guaiac negative.  As, signed out to [employer] pending remainder of labs, IV fluids and CT scan.  Amount and/or Complexity of Data Reviewed Labs: ordered.  Decision-making details documented in ED Course. Radiology: ordered.           Final Clinical Impression(s) / ED Diagnoses Final diagnoses:  None    Rx / DC Orders ED Discharge Orders     None         Josem Kaufmann 09/23/22 1909    Glendora Score, MD 09/24/22 [phone]

## 2022-09-23 NOTE — ED Provider Notes (Signed)
Patient was signed out to me at shift change from [facility] [place].  Patient has a recent history of septic shock secondary to infected gallbladder, undergoing cholecystectomy On September 9, feeling okay then woke this morning with transient episodes of dizziness.  Initial episode was followed up with a large black soft stool, no new medications, specifically iron supplementation or Pepto-Bismol.  She was Hemoccult negative here.  She was given IV fluids, pending CT scan imaging then reevaluation.  After receiving IV fluids patient felt improved she has had no more dizziness, does feel still little bit weak but is able to ambulate independently without difficulty.  Suspect clinical dehydration.  Her vital signs have been this is why you are dizzy but the answer is not much improved, initially was 101 systolic, 3 additional blood pressure readings ranging 1 36-1 51 systolic.  She is not orthostatic.  It is unclear for the change in her stools unless this is simply changes from her recent surgery.  CT scan imaging revealing small amount of gas and fluid along the liver margin consistent with her recent surgery.  She is not guarding, is minimally tender at her surgical site, she has been afebrile and has a normal WBC count this is not a postsurgical abscess, also doubt biliary leak.  Patient will be discharged home with return instructions provided.  Planned follow-up with surgery next week as scheduled,sooner for any worsened sx.             Burgess Amor, PA-C 09/23/22 2337    Glendora Score, MD 09/24/22 [phone]

## 2022-09-23 NOTE — Patient Outreach (Signed)
Care Coordination   Call from patient- worsening symptoms  Visit Note   09/23/2022 Name: Gloria Rose MRN: [id number] DOB: February 03, 1950  Gloria Rose is a 72 y.o. year old female who sees Gloria Rose, Gloria Hazel, MD for primary care. I spoke with  Gloria Rose by phone today.  What matters to the patients health and wellness today?  status post (s/p) xi robotic assisted laparoscopic cholecystectomy-        Goals Addressed             This Visit's Progress    home management of chronic medical conditions hypotension, diabetes,prevention of pneumonia after XI ROBOTIC ASSISTED LAPAROSCOPIC CHOLECYSTECTOMY -- care coordination services RN CM   Not on track    Interventions Today    Flowsheet Row Most Recent Value  Chronic Disease   Chronic disease during today's visit Other, Hypertension (HTN), Diabetes  [s/p XI ROBOTIC ASSISTED LAPAROSCOPIC CHOLECYSTECTOMY with reported dark stools, BP reported within normal limits per patient, dizzy for 2 days, nausea no vomiting,Denies Headache or visual changes, Unable to check cbg awaiting test strips for new machine]  General Interventions   General Interventions Discussed/Reviewed General Interventions Reviewed, Communication with, Sick Day Rules, Horticulturist, commercial (DME)  Durable Medical Equipment (DME) Glucomoter, BP Cuff  Communication with --  [called with patient for any available pcpappointment- none open, pcp without urgent care services @this  time. assisted patient to locate an in network urgent care to get labs & exam,she expresses no preference for ED visit. called her surgeon officeLVM]  Education Interventions   Education Provided Provided Education  Provided Verbal Education On Labs, Blood Sugar Monitoring, Medication, When to see the doctor, [facility], [employer], Sick Day Rules  [encouraged not to take BP medicines with low BPs,]  Mental Health Interventions   Mental Health Discussed/Reviewed Mental Health  Reviewed, Coping Strategies  Pharmacy Interventions   Pharmacy Dicussed/Reviewed Pharmacy Topics Reviewed, Medications and their functions              SDOH assessments and interventions completed:  No     Care Coordination Interventions:  Yes, provided   Follow up plan: Follow up call scheduled for 09/29/22    Encounter Outcome:  Patient Visit Completed   Gloria Bradford L. Noelle Penner, RN, BSN, CCM, Care Management Coordinator [phone]

## 2022-09-23 NOTE — ED Triage Notes (Signed)
Pt recently had gallbladder removed last Monday, nausea yesterday, dizziness last night.  Black stools  x 3 today.  + dizziness with standing or lying down.

## 2022-09-23 NOTE — Discharge Instructions (Signed)
Make sure you are drinking plenty of fluids to avoid dehydration.

## 2022-09-23 NOTE — Telephone Encounter (Signed)
Surgical Date: 09/15/2022 Procedure: XI ROBOTIC ASSISTED LAPAROSCOPIC CHOLECYSTECTOMY  Received call from [facility], Nurse Care Manager with [facility] [phone] telephone.   Reports that patient contacted her to discuss concerns of dark stools with increased dizziness. Reports that BP WNL per patient. Patient denied visual changes or headache. Reports that patient was instructed to go to ER/ UC for evaluation.   Cala Bradford states that she contacted [facility] to determine if patient could be evaluated in office as she would prefer to not wait in ER.   Advised that dark stools and dizziness should not be occurring from recent procedure, and patient should undergo full work up for possible blood loss. Advised to have patient present to ER for follow up.   Verbalized understanding.

## 2022-09-23 NOTE — ED Notes (Signed)
Patient transported to CT 

## 2022-09-29 ENCOUNTER — Ambulatory Visit: Payer: Self-pay | Admitting: *Deleted

## 2022-09-29 NOTE — Patient Outreach (Signed)
Care Coordination   Follow Up Visit Note   09/29/2022 Name: Gloria Rose MRN: [id number] DOB: 06-11-1950  Gloria Rose is a 72 y.o. year old female who sees Margo Aye, Kathleene Hazel, MD for primary care. I spoke with  [name] by phone today.  What matters to the patients health and wellness today?  Follow up on recent ED 09/23/22 visit for dizziness, diarrhea, dark stool, presyncope symptoms after recent hospital discharge on 09/16/22 for a cholecystectomy  Low blood pressure (BP) in ED but improved after bolus IV fluids and CT completed. CT showed small amount of gas and indications of recent surgery. Discharged home She again agreed to increase hydration and to monitor BP as RN CM encouraged since hospital discharge. Will obtain a BP cuff after 10/07/22 with her insurance over the counter (OTC) benefit           Goals Addressed             This Visit's Progress    home management of chronic medical conditions hypotension, diabetes,prevention of pneumonia after XI ROBOTIC ASSISTED LAPAROSCOPIC CHOLECYSTECTOMY -- care coordination services RN CM   On track    Interventions Today    Flowsheet Row Most Recent Value  Chronic Disease   Chronic disease during today's visit Other, Hypertension (HTN), Diabetes  [follow up Recent ED visit, denies further dizziness, nor frequent loose stools,  confirmed she feels better, cbgs in 100's, Still awaiting 10/07/22 new insurance over the counter benefit renewal to get a BP cuff]  General Interventions   General Interventions Discussed/Reviewed General Interventions Reviewed, Durable Medical Equipment (DME), Doctor Visits  Doctor Visits Discussed/Reviewed Doctor Visits Discussed, PCP, Specialist  Durable Medical Equipment (DME) BP Cuff, Glucomoter  PCP/Specialist Visits Compliance with follow-up visit  Mental Health Interventions   Mental Health Discussed/Reviewed Mental Health Reviewed, Coping Strategies  Nutrition Interventions   Nutrition  Discussed/Reviewed Nutrition Reviewed, Fluid intake  Pharmacy Interventions   Pharmacy Dicussed/Reviewed Pharmacy Topics Reviewed, Medications and their functions              SDOH assessments and interventions completed:  No     Care Coordination Interventions:  Yes, provided   Follow up plan: Follow up call scheduled for 10/06/22    Encounter Outcome:  Patient Visit Completed   [name], RN, BSN, CCM, Care Management Coordinator [phone]

## 2022-09-29 NOTE — Patient Instructions (Addendum)
Visit Information  Thank you for taking time to visit with me today. Please don't hesitate to contact me if I can be of assistance to you.   Monitor your blood pressure and hydration at home Contact a health care provider if: You develop a rash. You have more redness, swelling, or pain around your incisions. You have fluid or blood coming from your incisions. Your incisions feel warm to the touch. You have pus or a bad smell coming from your incisions. You have a fever. One or more of your incisions breaks open. Get help right away if: You have trouble breathing. You have chest pain. You have more pain in your shoulders. You faint or feel dizzy when you stand. You have severe pain in your abdomen. You have nausea or vomiting that lasts for more than one day. You have leg pain that is new or unusual, or if it is localized to one specific spot. These symptoms may represent a serious problem that is an emergency. Do not wait to see if the symptoms will go away. Get medical help right away. Call your local emergency services (911 in the [place] Do not drive yourself to the hospital.  Following are the goals we discussed today:   Goals Addressed             This Visit's Progress    home management of chronic medical conditions hypotension, diabetes,prevention of pneumonia after XI ROBOTIC ASSISTED LAPAROSCOPIC CHOLECYSTECTOMY -- care coordination services RN CM   On track    Interventions Today    Flowsheet Row Most Recent Value  Chronic Disease   Chronic disease during today's visit Other, Hypertension (HTN), Diabetes  [follow up Recent ED visit, denies further dizziness, nor frequent loose stools,  confirmed she feels better, cbgs in 100's, Still awaiting 10/07/22 new insurance over the counter benefit renewal to get a BP cuff]  General Interventions   General Interventions Discussed/Reviewed General Interventions Reviewed, Durable Medical Equipment (DME), Doctor Visits  Doctor  Visits Discussed/Reviewed Doctor Visits Discussed, PCP, Specialist  Durable Medical Equipment (DME) BP Cuff, Glucomoter  PCP/Specialist Visits Compliance with follow-up visit  Mental Health Interventions   Mental Health Discussed/Reviewed Mental Health Reviewed, Coping Strategies  Nutrition Interventions   Nutrition Discussed/Reviewed Nutrition Reviewed, Fluid intake  Pharmacy Interventions   Pharmacy Dicussed/Reviewed Pharmacy Topics Reviewed, Medications and their functions              Our next appointment is by telephone on 10/06/22 at 0945  Please call the care guide team at [phone] if you need to cancel or reschedule your appointment.   If you are experiencing a Mental Health or Behavioral Health Crisis or need someone to talk to, please call the Suicide and Crisis Lifeline: 988 call the [place] National Suicide Prevention Lifeline: 843-202-1809 or TTY: 323 347 1627 TTY 786-460-6629) to talk to a trained counselor call 1-800-273-TALK (toll free, 24 hour hotline) call the [facility]: [phone] call 911   Patient verbalizes understanding of instructions and care plan provided today and agrees to view in MyChart. Active MyChart status and patient understanding of how to access instructions and care plan via MyChart confirmed with patient.     The patient has been provided with contact information for the care management team and has been advised to call with any health related questions or concerns.   Lafaye Mcelmurry L. Noelle Penner, RN, BSN, CCM, Care Management Coordinator [phone]

## 2022-10-01 ENCOUNTER — Ambulatory Visit: Payer: Medicare HMO | Admitting: Surgery

## 2022-10-01 ENCOUNTER — Encounter: Payer: Self-pay | Admitting: Surgery

## 2022-10-01 VITALS — BP 103/64 | HR 59 | Temp 98.1°F | Resp 12 | Ht 66.0 in | Wt 173.0 lb

## 2022-10-01 DIAGNOSIS — Z09 Encounter for follow-up examination after completed treatment for conditions other than malignant neoplasm: Secondary | ICD-10-CM

## 2022-10-01 NOTE — Progress Notes (Signed)
[facility] Note   HPI:  72 y.o. Female presents to clinic for post-op follow-up status post robotic assisted laparoscopic cholecystectomy on 9/9.  Patient had an episode of dizziness with some hypotension and diarrhea.  She was evaluated in the emergency department, at which time CT of the abdomen and pelvis demonstrated a small amount of fluid in her right upper quadrant.  She had normal blood work without evidence of infection.  She was discharged home and advised to follow-up with her primary care doctor.  She states that her blood pressure has continued to be a little bit low, but she has had no further episodes of diarrhea.  She denies any abdominal pain recently.  Denies fevers and chills.  She is tolerating a diet without nausea or vomiting.  She is also moving her bowels without issue and has had no further episodes of diarrhea since being discharged from the ED.  Review of Systems:  All other review of systems: otherwise negative   Vital Signs:  There were no vitals taken for this visit.   Physical Exam:  Physical Exam Vitals reviewed.  Constitutional:      Appearance: Normal appearance.  Abdominal:     Comments: Abdomen soft, nondistended, no percussion tenderness, nontender to palpation; no rigidity, guarding, rebound tenderness; well-healing incision sites, umbilical site with a small scab present  Neurological:     Mental Status: She is alert.     Laboratory studies: None  Imaging:  None  Pathology: A. GALLBLADDER WITH CALOT'S NODE, CHOLECYSTECTOMY:  - Acute on chronic cholecystitis  - Two benign lymph nodes with reactive sinus histiocytosis.  See  comment.   Assessment:  72 y.o. yo Female who presents for follow-up status post robotic assisted laparoscopic cholecystectomy on 9/9.  Plan:  -Patient has been doing well since being evaluated in the emergency department.  She is tolerating a diet, moving her bowels, and pain is  well-controlled -Advised the patient on her pathology results -Follow up as needed  All of the above recommendations were discussed with the patient, and all of patient's questions were answered to her expressed satisfaction.  Theophilus Kinds, DO [facility] [address] Vella Raring [place], [place] [address] [phone] (office)

## 2022-10-06 ENCOUNTER — Ambulatory Visit: Payer: Self-pay | Admitting: *Deleted

## 2022-10-06 NOTE — Patient Instructions (Signed)
Visit Information  Thank you for taking time to visit with me today. Please don't hesitate to contact me if I can be of assistance to you.   Following are the goals we discussed today:   Goals Addressed             This Visit's Progress    home management of chronic medical conditions hypotension, diabetes,prevention of pneumonia after XI ROBOTIC ASSISTED LAPAROSCOPIC CHOLECYSTECTOMY -- care coordination services RN CM       Interventions Today    Flowsheet Row Most Recent Value  Chronic Disease   Chronic disease during today's visit Hypertension (HTN), Other  [appetite, MD visit]  General Interventions   General Interventions Discussed/Reviewed General Interventions Reviewed, Sick Day Rules, Doctor Visits, Horticulturist, commercial (DME)  Doctor Visits Discussed/Reviewed Doctor Visits Reviewed, PCP, Specialist  Durable Medical Equipment (DME) BP Cuff, Glucomoter  PCP/Specialist Visits Compliance with follow-up visit  Exercise Interventions   Exercise Discussed/Reviewed Exercise Reviewed, Physical Activity  Physical Activity Discussed/Reviewed Physical Activity Reviewed, Home Exercise Program (HEP)  Education Interventions   Education Provided Provided Education, Provided Web-based Education  Provided Verbal Education On Nutrition, Blood Sugar Monitoring, Mental Health/Coping with Illness, Exercise, Medication  [hypotension, orthostatic blood pressures, hydration, BRAT diet, how to take the best BP reading with a wrist BP cuff, Over the counter benefit program]  Mental Health Interventions   Mental Health Discussed/Reviewed Mental Health Reviewed, Coping Strategies  Nutrition Interventions   Nutrition Discussed/Reviewed Nutrition Reviewed, Fluid intake, Adding fruits and vegetables  [encouraged fluid intake related to hypotension /dizziness with getting up]  Pharmacy Interventions   Pharmacy Dicussed/Reviewed Pharmacy Topics Reviewed, Affording Medications, Medications and their  functions  [discusued use of metamucil, stool softener, fiber tab]              Our next appointment is by telephone on 10/20/22 at  1 pm   Please call the care guide team at [phone] if you need to cancel or reschedule your appointment.   If you are experiencing a Mental Health or Behavioral Health Crisis or need someone to talk to, please call the Suicide and Crisis Lifeline: 988 call the [place] National Suicide Prevention Lifeline: 469-479-9622 or TTY: 7472376101 TTY 248-004-9003) to talk to a trained counselor call 1-800-273-TALK (toll free, 24 hour hotline) call the [facility]: 403-580-3779 call 911   Patient verbalizes understanding of instructions and care plan provided today and agrees to view in MyChart. Active MyChart status and patient understanding of how to access instructions and care plan via MyChart confirmed with patient.     The patient has been provided with contact information for the care management team and has been advised to call with any health related questions or concerns.   Tremane Spurgeon L. Noelle Penner, RN, BSN, CCM, Care Management Coordinator [phone]

## 2022-10-06 NOTE — Patient Outreach (Signed)
Care Coordination   Follow Up Visit Note   10/06/2022 Name: Gloria Rose MRN: [id number] DOB: 1950-11-07  Gloria Rose is a 72 y.o. year old female who sees Margo Aye, Kathleene Hazel, MD for primary care. I spoke with  Gloria Rose by phone today.  What matters to the patients health and wellness today?  "I am doing much better" "but continues to have episodes of flashes of dizziness with getting up out of bed Stool not black  She saw surgeon and   Have appointment with pcp - Not taking medication  Has a wrist 123/78 still get flash of dizzy with getting out of bed history voiced understanding on how to take a wrist bp 108/74  130 170 cbg Decreased appetite after hospitalized  She has difficulty taking fluids in     Goals Addressed             This Visit's Progress    home management of chronic medical conditions hypotension, diabetes,prevention of pneumonia after XI ROBOTIC ASSISTED LAPAROSCOPIC CHOLECYSTECTOMY -- care coordination services RN CM       Interventions Today    Flowsheet Row Most Recent Value  Chronic Disease   Chronic disease during today's visit Hypertension (HTN), Other  [appetite, MD visit]  General Interventions   General Interventions Discussed/Reviewed General Interventions Reviewed, Sick Day Rules, Doctor Visits, Horticulturist, commercial (DME)  Doctor Visits Discussed/Reviewed Doctor Visits Reviewed, PCP, Specialist  Durable Medical Equipment (DME) BP Cuff, Glucomoter  PCP/Specialist Visits Compliance with follow-up visit  Exercise Interventions   Exercise Discussed/Reviewed Exercise Reviewed, Physical Activity  Physical Activity Discussed/Reviewed Physical Activity Reviewed, Home Exercise Program (HEP)  Education Interventions   Education Provided Provided Education, Provided Web-based Education  Provided Verbal Education On Nutrition, Blood Sugar Monitoring, Mental Health/Coping with Illness, Exercise, Medication  [hypotension, orthostatic blood  pressures, hydration, BRAT diet, how to take the best BP reading with a wrist BP cuff, Over the counter benefit program]  Mental Health Interventions   Mental Health Discussed/Reviewed Mental Health Reviewed, Coping Strategies  Nutrition Interventions   Nutrition Discussed/Reviewed Nutrition Reviewed, Fluid intake, Adding fruits and vegetables  [encouraged fluid intake related to hypotension /dizziness with getting up]  Pharmacy Interventions   Pharmacy Dicussed/Reviewed Pharmacy Topics Reviewed, Affording Medications, Medications and their functions  [discusued use of metamucil, stool softener, fiber tab]              SDOH assessments and interventions completed:  No     Care Coordination Interventions:  Yes, provided   Follow up plan: Follow up call scheduled for 10/20/22    Encounter Outcome:  Patient Visit Completed   [name], RN, BSN, CCM, Care Management Coordinator [phone]

## 2022-10-08 ENCOUNTER — Telehealth: Payer: Self-pay | Admitting: Adult Health

## 2022-10-08 NOTE — Telephone Encounter (Signed)
Patient was seen in 07/2019. I advised her that you would most likely want to see her before you refilled a rx. She would like a refill Clobetasol Prop Emollient Base 0.05% emollient cream.She said she would see if her regular pcp could fill it if she had to have an appointment. Please advise.

## 2022-10-10 NOTE — Telephone Encounter (Signed)
Pt aware she needs to be seen since it's been since 2021 that she was seen. Pt states she will ask her PCP for the cream next week at that appt. JSY

## 2022-10-16 ENCOUNTER — Other Ambulatory Visit (HOSPITAL_COMMUNITY): Payer: Self-pay | Admitting: Internal Medicine

## 2022-10-16 DIAGNOSIS — I10 Essential (primary) hypertension: Secondary | ICD-10-CM | POA: Diagnosis not present

## 2022-10-16 DIAGNOSIS — D509 Iron deficiency anemia, unspecified: Secondary | ICD-10-CM | POA: Diagnosis not present

## 2022-10-16 DIAGNOSIS — E119 Type 2 diabetes mellitus without complications: Secondary | ICD-10-CM | POA: Diagnosis not present

## 2022-10-16 DIAGNOSIS — Z1231 Encounter for screening mammogram for malignant neoplasm of breast: Secondary | ICD-10-CM

## 2022-10-16 DIAGNOSIS — E559 Vitamin D deficiency, unspecified: Secondary | ICD-10-CM | POA: Diagnosis not present

## 2022-10-20 ENCOUNTER — Encounter (HOSPITAL_COMMUNITY): Payer: Self-pay

## 2022-10-20 ENCOUNTER — Ambulatory Visit (HOSPITAL_COMMUNITY)
Admission: RE | Admit: 2022-10-20 | Discharge: 2022-10-20 | Disposition: A | Discharge status: Home / Self Care | Payer: Medicare HMO | Source: Ambulatory Visit | Attending: Internal Medicine | Admitting: Internal Medicine

## 2022-10-20 ENCOUNTER — Encounter: Payer: Medicare HMO | Admitting: *Deleted

## 2022-10-20 DIAGNOSIS — Z1231 Encounter for screening mammogram for malignant neoplasm of breast: Secondary | ICD-10-CM | POA: Diagnosis not present

## 2022-10-21 ENCOUNTER — Ambulatory Visit: Payer: Self-pay | Admitting: *Deleted

## 2022-10-21 DIAGNOSIS — E119 Type 2 diabetes mellitus without complications: Secondary | ICD-10-CM | POA: Diagnosis not present

## 2022-10-21 DIAGNOSIS — E559 Vitamin D deficiency, unspecified: Secondary | ICD-10-CM | POA: Diagnosis not present

## 2022-10-21 DIAGNOSIS — K219 Gastro-esophageal reflux disease without esophagitis: Secondary | ICD-10-CM | POA: Diagnosis not present

## 2022-10-21 DIAGNOSIS — Z23 Encounter for immunization: Secondary | ICD-10-CM | POA: Diagnosis not present

## 2022-10-21 DIAGNOSIS — I10 Essential (primary) hypertension: Secondary | ICD-10-CM | POA: Diagnosis not present

## 2022-10-21 DIAGNOSIS — L309 Dermatitis, unspecified: Secondary | ICD-10-CM | POA: Diagnosis not present

## 2022-10-21 DIAGNOSIS — E782 Mixed hyperlipidemia: Secondary | ICD-10-CM | POA: Diagnosis not present

## 2022-10-21 DIAGNOSIS — F418 Other specified anxiety disorders: Secondary | ICD-10-CM | POA: Diagnosis not present

## 2022-10-21 DIAGNOSIS — R5383 Other fatigue: Secondary | ICD-10-CM | POA: Diagnosis not present

## 2022-10-21 NOTE — Patient Instructions (Addendum)
Visit Information  Thank you for taking time to visit with me today. Please don't hesitate to contact me if I can be of assistance to you.   Following are the goals we discussed today:   Goals Addressed             This Visit's Progress    home management of chronic medical conditions hypotension, diabetes, eczema   On track    Interventions Today    Flowsheet Row Most Recent Value  Chronic Disease   Chronic disease during today's visit Diabetes, Other, Hypertension (HTN)  [assessed for ear infection-ruled out, glucose and blood pressure are within normal limitsl. 2 hypertension medicines discontinued by pcp today Dizziness continues, Appetite better PCp diagnosed ezcema today]  General Interventions   General Interventions Discussed/Reviewed General Interventions Reviewed, Labs, Doctor Visits, Durable Medical Equipment (DME)  Doctor Visits Discussed/Reviewed Doctor Visits Reviewed, PCP  Durable Medical Equipment (DME) BP Cuff  [now has her blood pressure cuff and is monitoring prior to taking hypertension medicine]  PCP/Specialist Visits Compliance with follow-up visit  Exercise Interventions   Exercise Discussed/Reviewed Exercise Reviewed, Physical Activity  Physical Activity Discussed/Reviewed Physical Activity Reviewed, Home Exercise Program (HEP)  Education Interventions   Education Provided Provided Web-based Education, Provided Education  [ear infection , eczema, hypotension, dizziness]  Provided Verbal Education On Nutrition, Blood Sugar Monitoring, Medication, Labs  Labs Reviewed Hgb A1c  Nutrition Interventions   Nutrition Discussed/Reviewed Nutrition Reviewed, Fluid intake  Pharmacy Interventions   Pharmacy Dicussed/Reviewed Pharmacy Topics Reviewed, Affording Medications              Our next appointment is by telephone on 12/02/22 at 2 pm   Please call the care guide team at [phone] if you need to cancel or reschedule your appointment.   If you are  experiencing a Mental Health or Behavioral Health Crisis or need someone to talk to, please call the Suicide and Crisis Lifeline: 988 call the [place] National Suicide Prevention Lifeline: 9856653578 or TTY: 331-826-4048 TTY 316-098-1107) to talk to a trained counselor call 1-800-273-TALK (toll free, 24 hour hotline) call the [facility]: 667-450-1910 call 911   Patient verbalizes understanding of instructions and care plan provided today and agrees to view in MyChart. Active MyChart status and patient understanding of how to access instructions and care plan via MyChart confirmed with patient.     The patient has been provided with contact information for the care management team and has been advised to call with any health related questions or concerns.   Misha Vanoverbeke L. Noelle Penner, RN, BSN, [employer]  VBCI Care Management Coordinator  [phone]  Fax: [phone]

## 2022-10-21 NOTE — Patient Outreach (Addendum)
Care Coordination   Follow Up Visit Note   10/21/2022 Name: Gloria Rose MRN: [id number] DOB: Apr 21, 1950  Gloria Rose is a 72 y.o. year old female who sees Gloria Rose, Gloria Hazel, MD for primary care. I spoke with  Gloria Rose by phone today.  What matters to the patients health and wellness today?  Dizziness, diabetes , decrease medicines, varying loose  Dizziness continues at intervals notices if she stays bent over too lon 130/64 Diabetes  blood sugar in 100's today Wonderful HgA1c  Saw pcp Gloria Rose today, SBP at 101 Diagnosed and being treated for eczema   Goals Addressed             This Visit's Progress    home management of chronic medical conditions hypotension, diabetes, eczema   On track    Interventions Today    Flowsheet Row Most Recent Value  Chronic Disease   Chronic disease during today's visit Diabetes, Other, Hypertension (HTN)  [assessed for ear infection-ruled out, glucose and blood pressure are within normal limitsl. 2 hypertension medicines discontinued by pcp today Dizziness continues, Appetite better PCp diagnosed ezcema today]  General Interventions   General Interventions Discussed/Reviewed General Interventions Reviewed, Labs, Doctor Visits, Durable Medical Equipment (DME)  Doctor Visits Discussed/Reviewed Doctor Visits Reviewed, PCP  Durable Medical Equipment (DME) BP Cuff  [now has her blood pressure cuff and is monitoring prior to taking hypertension medicine]  PCP/Specialist Visits Compliance with follow-up visit  Exercise Interventions   Exercise Discussed/Reviewed Exercise Reviewed, Physical Activity  Physical Activity Discussed/Reviewed Physical Activity Reviewed, Home Exercise Program (HEP)  Education Interventions   Education Provided Provided Web-based Education, Provided Education  [ear infection , eczema, hypotension, dizziness]  Provided Verbal Education On Nutrition, Blood Sugar Monitoring, Medication, Labs  Labs Reviewed Hgb A1c   Nutrition Interventions   Nutrition Discussed/Reviewed Nutrition Reviewed, Fluid intake  Pharmacy Interventions   Pharmacy Dicussed/Reviewed Pharmacy Topics Reviewed, Affording Medications              SDOH assessments and interventions completed:  No     Care Coordination Interventions:  Yes, provided   Follow up plan: Follow up call scheduled for 12/02/22    Encounter Outcome:  Patient Visit Completed   Gloria Bradford L. Noelle Penner, RN, BSN, [facility]  VBCI Care Management Coordinator  [phone]  Fax: [phone]

## 2022-10-23 ENCOUNTER — Other Ambulatory Visit (HOSPITAL_COMMUNITY): Payer: Self-pay | Admitting: Internal Medicine

## 2022-10-23 DIAGNOSIS — R928 Other abnormal and inconclusive findings on diagnostic imaging of breast: Secondary | ICD-10-CM

## 2022-10-28 ENCOUNTER — Ambulatory Visit (HOSPITAL_COMMUNITY)
Admission: RE | Admit: 2022-10-28 | Discharge: 2022-10-28 | Disposition: A | Discharge status: Home / Self Care | Payer: Medicare HMO | Source: Ambulatory Visit | Attending: Internal Medicine | Admitting: Internal Medicine

## 2022-10-28 DIAGNOSIS — R928 Other abnormal and inconclusive findings on diagnostic imaging of breast: Secondary | ICD-10-CM | POA: Diagnosis not present

## 2022-10-28 DIAGNOSIS — N6315 Unspecified lump in the right breast, overlapping quadrants: Secondary | ICD-10-CM | POA: Diagnosis not present

## 2022-10-28 DIAGNOSIS — R92321 Mammographic fibroglandular density, right breast: Secondary | ICD-10-CM | POA: Diagnosis not present

## 2022-10-30 NOTE — Plan of Care (Signed)
[facility] Tonsillectomy/Adenoidectomy, Postoperative PEDS care plan entered in error.

## 2022-12-02 ENCOUNTER — Ambulatory Visit: Payer: Self-pay | Admitting: *Deleted

## 2022-12-02 NOTE — Patient Outreach (Signed)
  Care Coordination   Follow Up Visit Note   12/02/2022 Name: Gloria Rose MRN: [id number] DOB: 1950/01/17  Gloria Rose is a 72 y.o. year old female who sees Margo Aye, Kathleene Hazel, MD for primary care. I spoke with  [name] by phone today.  What matters to the patients health and wellness today?  Had some dizziness but remembered to hydrate and the symptoms resolved She now keeps extra water in the home to sip throughout the day  Voiced understanding of her treatment for her recent abnormal mammogram. Will be following up next month [place] [employer] medicare will be her coverage for 2025 She denies and medical or social concerns today She out shopping and voices appreciation for the outreach Agrees to follow up outreach   Goals Addressed             This Visit's Progress    home management of chronic medical conditions hypotension, diabetes, eczema   On track    Interventions Today    Flowsheet Row Most Recent Value  Chronic Disease   Chronic disease during today's visit Other  [dizziness, abnormal mammogram, hydration]  General Interventions   General Interventions Discussed/Reviewed General Interventions Reviewed, Sick Day Rules, Doctor Visits  Doctor Visits Discussed/Reviewed Doctor Visits Reviewed, PCP, Specialist  PCP/Specialist Visits Compliance with follow-up visit  Exercise Interventions   Exercise Discussed/Reviewed Exercise Reviewed, Physical Activity  Physical Activity Discussed/Reviewed Physical Activity Reviewed  [confirms remains active]  Education Interventions   Education Provided Provided Education  Provided Verbal Education On Nutrition, Sick Day Rules  Mental Health Interventions   Mental Health Discussed/Reviewed Mental Health Reviewed, Coping Strategies  Nutrition Interventions   Nutrition Discussed/Reviewed Nutrition Reviewed, Fluid intake  Pharmacy Interventions   Pharmacy Dicussed/Reviewed Pharmacy Topics Reviewed, Medications and their  functions              SDOH assessments and interventions completed:  No{THN Tip this will not be part of the note when signed-REQUIRED REPORT FIELD DO NOT DELETE (Optional):27901}     Care Coordination Interventions:  Yes, provided {THN Tip this will not be part of the note when signed-REQUIRED REPORT FIELD DO NOT DELETE (Optional):27901}  Follow up plan: Follow up call scheduled for 12/29/22 2:30 pm    Encounter Outcome:  Patient Visit Completed {THN Tip this will not be part of the note when signed-REQUIRED REPORT FIELD DO NOT DELETE (Optional):27901}  Kyna Blahnik L. Noelle Penner, RN, BSN, [facility]  VBCI Care Management Coordinator  [phone]  Fax: [phone]

## 2022-12-03 NOTE — Patient Instructions (Signed)
Visit Information  Thank you for taking time to visit with me today. Please don't hesitate to contact me if I can be of assistance to you.   Following are the goals we discussed today:   Goals Addressed             This Visit's Progress    home management of chronic medical conditions hypotension, diabetes, eczema   On track    Interventions Today    Flowsheet Row Most Recent Value  Chronic Disease   Chronic disease during today's visit Other  [dizziness, abnormal mammogram, hydration]  General Interventions   General Interventions Discussed/Reviewed General Interventions Reviewed, Sick Day Rules, Doctor Visits  Doctor Visits Discussed/Reviewed Doctor Visits Reviewed, PCP, Specialist  PCP/Specialist Visits Compliance with follow-up visit  Exercise Interventions   Exercise Discussed/Reviewed Exercise Reviewed, Physical Activity  Physical Activity Discussed/Reviewed Physical Activity Reviewed  [confirms remains active]  Education Interventions   Education Provided Provided Education  Provided Verbal Education On Nutrition, Sick Day Rules  Mental Health Interventions   Mental Health Discussed/Reviewed Mental Health Reviewed, Coping Strategies  Nutrition Interventions   Nutrition Discussed/Reviewed Nutrition Reviewed, Fluid intake  Pharmacy Interventions   Pharmacy Dicussed/Reviewed Pharmacy Topics Reviewed, Medications and their functions              Our next appointment is by telephone on 12/29/22 at 2:30 pm  Please call the care guide team at [phone] if you need to cancel or reschedule your appointment.   If you are experiencing a Mental Health or Behavioral Health Crisis or need someone to talk to, please call the Suicide and Crisis Lifeline: 988 call the [place] National Suicide Prevention Lifeline: 979-553-4942 or TTY: 249-119-6307 TTY (307) 538-2789) to talk to a trained counselor call 1-800-273-TALK (toll free, 24 hour hotline) call the [facility]: [phone] call 911   Patient verbalizes understanding of instructions and care plan provided today and agrees to view in MyChart. Active MyChart status and patient understanding of how to access instructions and care plan via MyChart confirmed with patient.     The patient has been provided with contact information for the care management team and has been advised to call with any health related questions or concerns.   Daniela Siebers L. Noelle Penner, RN, BSN, [facility]  VBCI Care Management Coordinator  [phone]  Fax: [phone]

## 2022-12-15 DIAGNOSIS — M7032 Other bursitis of elbow, left elbow: Secondary | ICD-10-CM | POA: Diagnosis not present

## 2022-12-15 DIAGNOSIS — Z0001 Encounter for general adult medical examination with abnormal findings: Secondary | ICD-10-CM | POA: Diagnosis not present

## 2022-12-15 DIAGNOSIS — Z0000 Encounter for general adult medical examination without abnormal findings: Secondary | ICD-10-CM | POA: Diagnosis not present

## 2022-12-29 ENCOUNTER — Ambulatory Visit: Payer: Self-pay | Admitting: *Deleted

## 2022-12-29 NOTE — Patient Instructions (Addendum)
Visit Information  Thank you for taking time to visit with me today. Please don't hesitate to contact me if I can be of assistance to you.   Following are the goals we discussed today:   Please review this article- some of your post gall bladder surgery may be risks from the procedure [email or web address]   Goals Addressed             This Visit's Progress    home management of post gall bladder surgery symptoms, chronic medical conditions hypotension, diabetes, eczema VBCI RN CM   On track    Patient will follow up with MD as needed & scheduled Patient will seek emergency services for any of the following symptoms & be aware that some of the symptoms may be related to post gall bladder surgery Get help right away if: You feel dizzy or you faint while standing. The area around the catheter incision site feels warm to the touch. You have pus or a bad smell coming from the catheter incision site. You have shortness of breath. You have a rapid heartbeat. Your catheter becomes blocked. Your catheter comes out of your abdomen. These symptoms may be an emergency. Get help right away. Call 911. Do not wait to see if the symptoms will go away. Do not drive yourself to the hospital. Do not drive yourself to the hospital.  Interventions Today    Flowsheet Row Most Recent Value  Chronic Disease   Chronic disease during today's visit Other  [continued GI s/s post gall bladder removal]  General Interventions   General Interventions Discussed/Reviewed General Interventions Reviewed, Doctor Visits  Doctor Visits Discussed/Reviewed --  [unable to access pcp office site to review her up coming 2025 office visit]  PCP/Specialist Visits Compliance with follow-up visit  Exercise Interventions   Exercise Discussed/Reviewed Exercise Discussed, Physical Activity  [confirmed she is keeping active she is presently completing christmas errands]  Education Interventions    Education Provided Provided Web-based Education, Provided Education  [post gall bladder surgery risks of symptoms, what to seek emergency services for immediately - articles on risks and symptoms to seek services for]  Provided Verbal Education On Other, Sick Day Rules, When to see the doctor  Mental Health Interventions   Mental Health Discussed/Reviewed Coping Strategies, Mental Health Reviewed  Nutrition Interventions   Nutrition Discussed/Reviewed Decreasing fats, Nutrition Reviewed  Pharmacy Interventions   Pharmacy Dicussed/Reviewed Pharmacy Topics Reviewed, Affording Medications              Our next appointment is by telephone on 01/29/23 at 2:30 pm  Please call the care guide team at [phone] if you need to cancel or reschedule your appointment.   If you are experiencing a Mental Health or Behavioral Health Crisis or need someone to talk to, please call the Suicide and Crisis Lifeline: 988 call the [place] National Suicide Prevention Lifeline: 785-604-9053 or TTY: 978-038-3754 TTY 607 444 7878) to talk to a trained counselor call 1-800-273-TALK (toll free, 24 hour hotline) call the [facility]: 773-876-1277 call 911   Patient verbalizes understanding of instructions and care plan provided today and agrees to view in MyChart. Active MyChart status and patient understanding of how to access instructions and care plan via MyChart confirmed with patient.     The patient has been provided with contact information for the care management team and has been advised to call with any health related questions or concerns.   Rontrell Moquin L. Noelle Penner, RN, BSN, CCM  VBCI Care Management  Coordinator  [phone]  Fax: [phone]

## 2022-12-29 NOTE — Patient Outreach (Addendum)
  Care Coordination   12/29/2022 Name: Gloria Rose MRN: [id number] DOB: 01/01/1951   Care Coordination Outreach Attempts:  An unsuccessful outreach was attempted for an appointment today.  Follow Up Plan:  Additional outreach attempts will be made to offer the patient complex care management information and services.   Encounter Outcome:  No Answer   Care Coordination Interventions:  No, not indicated    Kenady Doxtater L. Noelle Penner, RN, BSN, [facility]  VBCI Care Management Coordinator  [phone]  Fax: [phone]

## 2022-12-29 NOTE — Patient Outreach (Signed)
  Care Coordination   Follow Up Visit Note   12/29/2022 Name: Gloria Rose MRN: [id number] DOB: 03-Apr-1950  Gloria Rose is a 72 y.o. year old female who sees Margo Aye, Kathleene Hazel, MD for primary care. I spoke with  [name] by phone today.  What matters to the patients health and wellness today?  Noted gastro intestinal symptoms of abdominal pain, flatulence after eating certain foods (greasy+) Overall recovering from her gall bladder surgery No further dizziness Will spend time with family for the holiday Voices understanding of worsening GI symptoms plan of care  Discussed future appointments  Voiced understanding that RN CM not able to access the pcp charting system Eboni will not be available soon   Goals Addressed             This Visit's Progress    home management of post gall bladder surgery symptoms, chronic medical conditions hypotension, diabetes, eczema VBCI RN CM       Patient will follow up with MD as needed & scheduled Patient will seek emergency services for any of the following symptoms & be aware that some of the symptoms may be related to post gall bladder surgery Get help right away if: You feel dizzy or you faint while standing. The area around the catheter incision site feels warm to the touch. You have pus or a bad smell coming from the catheter incision site. You have shortness of breath. You have a rapid heartbeat. Your catheter becomes blocked. Your catheter comes out of your abdomen. These symptoms may be an emergency. Get help right away. Call 911. Do not wait to see if the symptoms will go away. Do not drive yourself to the hospital. Do not drive yourself to the hospital.  Interventions Today    Flowsheet Row Most Recent Value  Chronic Disease   Chronic disease during today's visit Other  [continued GI s/s post gall bladder removal]  General Interventions   General Interventions Discussed/Reviewed General Interventions Reviewed, Doctor  Visits  Doctor Visits Discussed/Reviewed --  [unable to access pcp office site to review her up coming 2025 office visit]  PCP/Specialist Visits Compliance with follow-up visit  Exercise Interventions   Exercise Discussed/Reviewed Exercise Discussed, Physical Activity  [confirmed she is keeping active she is presently completing christmas errands]  Education Interventions   Education Provided Provided Web-based Education, Provided Education  [post gall bladder surgery risks of symptoms, what to seek emergency services for immediately - articles on risks and symptoms to seek services for]  Provided Verbal Education On Other, Sick Day Rules, When to see the doctor  Mental Health Interventions   Mental Health Discussed/Reviewed Coping Strategies, Mental Health Reviewed  Nutrition Interventions   Nutrition Discussed/Reviewed Decreasing fats, Nutrition Reviewed  Pharmacy Interventions   Pharmacy Dicussed/Reviewed Pharmacy Topics Reviewed, Affording Medications              SDOH assessments and interventions completed:  No     Care Coordination Interventions:  Yes, provided   Follow up plan: Follow up call scheduled for 01/29/23    Encounter Outcome:  Patient Visit Completed   [name], RN, BSN, [facility]  VBCI Care Management Coordinator  [phone]  Fax: [phone]

## 2023-01-28 ENCOUNTER — Telehealth: Payer: Self-pay | Admitting: *Deleted

## 2023-01-28 DIAGNOSIS — K529 Noninfective gastroenteritis and colitis, unspecified: Secondary | ICD-10-CM

## 2023-01-28 NOTE — Telephone Encounter (Signed)
Referral orders placed.   Call placed to patient. LMTRC.

## 2023-01-28 NOTE — Telephone Encounter (Signed)
Surgical Date: 09/15/2022 Procedure: XI ROBOTIC ASSISTED LAPAROSCOPIC CHOLECYSTECTOMY  Received call from patient [phone]~ telephone.   Reports that she continues to have loose stools and requested referral to gastroenterology.   Patient had colonoscopy with Dr. Marletta Lor in 2022, but was not seen in their office.  Ok to refer?

## 2023-01-29 ENCOUNTER — Ambulatory Visit: Payer: Self-pay | Admitting: *Deleted

## 2023-01-29 NOTE — Telephone Encounter (Signed)
Call placed to patient and patient made aware.  

## 2023-01-29 NOTE — Patient Outreach (Signed)
  Care Coordination   Follow Up Visit Note   03/02/2023 updated for 01/29/23 Name: Gloria Rose MRN: [id number] DOB: [birth year]  Gloria Rose is a 73 y.o. year old female who sees Margo Aye, Kathleene Hazel, MD for primary care. I spoke with  [name] by phone today.  What matters to the patients health and wellness today?  Heating unit out- using in home propane heaters + sewer had to be pumped out today/started last Thursday - then their water froze this morning Repprts not needing social determinants of health (SDOH)/care guide assistance at this time  Spoke with Dr Santina Evans Pappayliou has GI referral to Dr Marletta Lor on 01/28/23 Pending  Now each time she eats she gets diarrhea or too many loose stools a day   Goals Addressed             This Visit's Progress    home management of post gall bladder surgery symptoms, chronic medical conditions hypotension, diabetes, eczema VBCI RN CM   On track    Patient will follow up with MD as needed & scheduled Patient will seek emergency services for any of the following symptoms & be aware that some of the symptoms may be related to post gall bladder surgery Get help right away if: You feel dizzy or you faint while standing. The area around the catheter incision site feels warm to the touch. You have pus or a bad smell coming from the catheter incision site. You have shortness of breath. You have a rapid heartbeat. Your catheter becomes blocked. Your catheter comes out of your abdomen. These symptoms may be an emergency. Get help right away. Call 911. Do not wait to see if the symptoms will go away. Do not drive yourself to the hospital. Do not drive yourself to the hospital.  Interventions Today    Flowsheet Row Most Recent Value  Chronic Disease   Chronic disease during today's visit Other  [GI s/s post gall bladder removal , medication cost]  General Interventions   General Interventions Discussed/Reviewed General Interventions  Reviewed, Doctor Visits  Doctor Visits Discussed/Reviewed Doctor Visits Reviewed, Specialist  PCP/Specialist Visits Compliance with follow-up visit  Exercise Interventions   Exercise Discussed/Reviewed Exercise Reviewed, Physical Activity  Physical Activity Discussed/Reviewed Physical Activity Reviewed  Education Interventions   Education Provided Provided Education  [good rx]  Provided Verbal Education On [facility], Medication, Other, Nutrition  [discussed the medicare gap for 2025 (pay $2000 out of pocket before able to obtain medicines at lower prices or free)]  Mental Health Interventions   Mental Health Discussed/Reviewed Mental Health Reviewed, Coping Strategies  Pharmacy Interventions   Pharmacy Dicussed/Reviewed Pharmacy Topics Reviewed, Affording Medications              SDOH assessments and interventions completed:  No     Care Coordination Interventions:  Yes, provided   Follow up plan: Follow up call scheduled for 03/02/23 230 pm    Encounter Outcome:  Patient Visit Completed   [name], RN, BSN, CCM [place]  [facility], [facility] RN Care Manager Direct Dial: [phone]  Fax: [phone] Mailing Address: [address]  [place] [address] Website: [email or web address]

## 2023-01-29 NOTE — Telephone Encounter (Signed)
Call placed to patient. LMTRC.  

## 2023-02-11 ENCOUNTER — Encounter: Payer: Self-pay | Admitting: Internal Medicine

## 2023-02-11 ENCOUNTER — Ambulatory Visit (INDEPENDENT_AMBULATORY_CARE_PROVIDER_SITE_OTHER): Payer: Medicare Other | Admitting: Internal Medicine

## 2023-02-11 VITALS — BP 112/74 | HR 62 | Temp 97.8°F | Ht 66.0 in | Wt 176.3 lb

## 2023-02-11 DIAGNOSIS — R140 Abdominal distension (gaseous): Secondary | ICD-10-CM

## 2023-02-11 DIAGNOSIS — R197 Diarrhea, unspecified: Secondary | ICD-10-CM | POA: Diagnosis not present

## 2023-02-11 DIAGNOSIS — K219 Gastro-esophageal reflux disease without esophagitis: Secondary | ICD-10-CM

## 2023-02-11 DIAGNOSIS — K529 Noninfective gastroenteritis and colitis, unspecified: Secondary | ICD-10-CM | POA: Diagnosis not present

## 2023-02-11 MED ORDER — CHOLESTYRAMINE 4 G PO PACK
4.0000 g | PACK | Freq: Every day | ORAL | 5 refills | Status: DC
Start: 2023-02-11 — End: 2023-02-19

## 2023-02-11 NOTE — Progress Notes (Signed)
 Primary Care Physician:  Shona Norleen PEDLAR, MD Primary Gastroenterologist:  Dr. Cindie  Chief Complaint  Patient presents with   Diarrhea    Patient here today with complaints of diarrhea, which has been ongoing for several years. This is now getting worse. She has been using otc imodium,which is no  longer helping her. She has tried a air traffic controller and this is not helping either.    HPI:   Gloria Rose is a 73 y.o. female who presents to clinic today by referral from her PCP Dr. Shona for evaluation.     Chronic diarrhea: Patient states she has had issues with her bowels for multiple years.  Chronically loose.  This is progressively worsening over the past 6 months, averaging over 4 loose bowel movements a day.  Notes associated urgency.  Also has associated bloating and cramping.  No melena hematochezia.  Status post cholecystectomy September 2024.  She is unsure if her symptoms worsened after this or not.  Taking Imodium which does not help anymore, used to provide some relief.  Colonoscopy 10/23/2020 unremarkable.  Biopsies not taken at time as this was for colon cancer screening purposes.  Reports history of diverticulitis in the past.  Previously on metformin  which has since been discontinued, no improvement in her diarrhea since stopping.  Chronically takes sertraline and omeprazole.  Chronic GERD: well-controlled omeprazole daily.  No dysphagia odynophagia.  No epigastric or chest pain.  Past Medical History:  Diagnosis Date   Cancer (HCC)    melanoma of toe (big toe on left foot) removed   Depression    Diabetes mellitus without complication (HCC)    dx 7-8 yrs  type 2   GERD (gastroesophageal reflux disease)    Hypertension    Nausea & vomiting 09/12/2022    Past Surgical History:  Procedure Laterality Date   CHOLECYSTECTOMY     COLONOSCOPY WITH PROPOFOL  N/A 10/23/2020   Procedure: COLONOSCOPY WITH PROPOFOL ;  Surgeon: Cindie Carlin POUR, DO;  Location[facility];  Service: Endoscopy;  Laterality: N/A;  10:00 / ASA II   RTR     RIGHT SHOULDER IN 2013   TOTAL KNEE ARTHROPLASTY Left 07/03/2014   Procedure: LEFT TOTAL KNEE ARTHROPLASTY;  Surgeon: Marcey Raman, MD;  Location: [facility] OR;  Service: Orthopedics;  Laterality: Left;    Current Outpatient Medications  Medication Sig Dispense Refill   amLODipine  (NORVASC ) 5 MG tablet Take 5 mg by mouth daily.     aspirin EC 81 MG tablet Take 81 mg by mouth at bedtime.     atenolol  (TENORMIN ) 100 MG tablet Take 100 mg by mouth daily.     cetirizine (ZYRTEC) 10 MG chewable tablet Chew 10 mg by mouth daily.     clobetasol  cream (TEMOVATE ) 0.05 % Apply 1 Application topically 2 (two) times daily. As needed.     ergocalciferol (VITAMIN D2) 1.25 MG (50000 UT) capsule Take 50,000 Units by mouth once a week.     hydrochlorothiazide  (HYDRODIURIL ) 25 MG tablet Take 25 mg by mouth daily.     lisinopril  (ZESTRIL ) 20 MG tablet Take 20 mg by mouth daily.     omeprazole (PRILOSEC) 20 MG capsule Take 20 mg by mouth daily.     pravastatin  (PRAVACHOL ) 40 MG tablet Take 40 mg by mouth daily.     sertraline (ZOLOFT) 50 MG tablet Take 50 mg by mouth daily.     No current facility-administered medications for this visit.    Allergies as  of 02/11/2023 - Review Complete 02/11/2023  Allergen Reaction Noted   Penicillins Anaphylaxis and Hives 06/20/2014    Family History  Problem Relation Age of Onset   Cancer Mother        lung cancer   Cancer Sister        lung    Social History   Socioeconomic History   Marital status: Married    Spouse name: Not on file   Number of children: Not on file   Years of education: Not on file   Highest education level: Not on file  Occupational History   Not on file  Tobacco Use   Smoking status: Never   Smokeless tobacco: Never  Vaping Use   Vaping status: Never Used  Substance and Sexual Activity   Alcohol use: Yes    Alcohol/week: 2.0 standard drinks of alcohol    Types:  2 Glasses of wine per week   Drug use: No   Sexual activity: Yes    Birth control/protection: None, Post-menopausal  Other Topics Concern   Not on file  Social History Narrative   Not on file   Social Drivers of Health   Financial Resource Strain: Low Risk  (08/06/2020)   Received from [facility]   Overall Financial Resource Strain (CARDIA)    Difficulty of Paying Living Expenses: Not hard at all  Food Insecurity: No Food Insecurity (09/17/2022)   Hunger Vital Sign    Worried About Running Out of Food in the Last Year: Never true    Ran Out of Food in the Last Year: Never true  Transportation Needs: No Transportation Needs (09/12/2022)   PRAPARE - Administrator, Civil Service (Medical): No    Lack of Transportation (Non-Medical): No  Physical Activity: Inactive (08/06/2020)   Received from [facility]   Exercise Vital Sign    Days of Exercise per Week: 0 days    Minutes of Exercise per Session: 0 min  Stress: Stress Concern Present (08/06/2020)   Received from [facility] of Occupational [facility] - Occupational Stress Questionnaire    Feeling of Stress : To some extent  Social Connections: Unknown (05/13/2021)   Received from [facility], [facility]   Social Network    Social Network: Not on file  Intimate Partner Violence: Not At Risk (09/12/2022)   Humiliation, Afraid, Rape, and Kick questionnaire    Fear of Current or Ex-Partner: No    Emotionally Abused: No    Physically Abused: No    Sexually Abused: No    Subjective: Review of Systems  Constitutional:  Negative for chills and fever.  HENT:  Negative for congestion and hearing loss.   Eyes:  Negative for blurred vision and double vision.  Respiratory:  Negative for cough and shortness of breath.   Cardiovascular:  Negative for chest pain and palpitations.  Gastrointestinal:  Positive for diarrhea and heartburn. Negative for abdominal  pain, blood in stool, constipation, melena and vomiting.  Genitourinary:  Negative for dysuria and urgency.  Musculoskeletal:  Negative for joint pain and myalgias.  Skin:  Negative for itching and rash.  Neurological:  Negative for dizziness and headaches.  Psychiatric/Behavioral:  Negative for depression. The patient is not nervous/anxious.        Objective: BP 112/74 (BP Location: Left Arm, Patient Position: Sitting, Cuff Size: Normal)   Pulse 62   Temp 97.8 F (36.6 C) (Temporal)   Ht 5'  6 (1.676 m)   Wt 176 lb 4.8 oz (80 kg)   BMI 28.46 kg/m  Physical Exam Constitutional:      Appearance: Normal appearance.  HENT:     Head: Normocephalic and atraumatic.  Eyes:     Extraocular Movements: Extraocular movements intact.     Conjunctiva/sclera: Conjunctivae normal.  Cardiovascular:     Rate and Rhythm: Normal rate and regular rhythm.  Pulmonary:     Effort: Pulmonary effort is normal.     Breath sounds: Normal breath sounds.  Abdominal:     General: Bowel sounds are normal.     Palpations: Abdomen is soft.  Musculoskeletal:        General: No swelling. Normal range of motion.     Cervical back: Normal range of motion and neck supple.  Skin:    General: Skin is warm and dry.     Coloration: Skin is not jaundiced.  Neurological:     General: No focal deficit present.     Mental Status: She is alert and oriented to person, place, and time.  Psychiatric:        Mood and Affect: Mood normal.        Behavior: Behavior normal.      Assessment/Plan:  1.  Diarrhea, chronic-etiology unclear.  Will check celiac disease panel, TSH, ESR/CRP.  Symptoms worsened after cholecystectomy, possible bile acid induced diarrhea.  Will trial on cholestyramine  4 g daily and see how she does.  Counseled to take at least 1 hour after other medications and she understands.  Can take Imodium on top of this as needed.  Chronically takes sertraline and PPI which puts her at risk for  microscopic colitis.  May need to consider colonoscopy to further evaluate if not improved.  2.  Chronic GERD-well-controlled on omeprazole daily.  Will continue.  Follow-up in 4 to 6 weeks.  Thank you Dr. Shona for the kind referral  02/11/2023 2:02 PM   Disclaimer: This note was dictated with voice recognition software. Similar sounding words can inadvertently be transcribed and may not be corrected upon review.

## 2023-02-11 NOTE — Patient Instructions (Signed)
 I am going to check blood work today at [facility] to screen you for celiac disease, check a few inflammatory markers, as well as check your thyroid .  We will call with results.  I am also going to start you on a new medication called cholestyramine  4 g daily.  If you take all your medications at night then I would take this medication in the morning.  Follow-up in 4 to 6 weeks, if not improved, we will likely need to consider colonoscopy to further evaluate.  Continue on omeprazole daily for your chronic acid flux.  It was very nice seeing you again today.  Dr. Cindie

## 2023-02-13 LAB — CELIAC DISEASE PANEL
(tTG) Ab, IgA: <1 U/mL
(tTG) Ab, IgG: <1 U/mL
Gliadin IgA: 1.3 U/mL
Gliadin IgG: <1 U/mL
Immunoglobulin A: 256 mg/dL (ref 70–320)

## 2023-02-13 LAB — C-REACTIVE PROTEIN: CRP: <3 mg/L (ref <8.0)

## 2023-02-13 LAB — TSH: TSH: 0.58 m[IU]/L (ref 0.40–4.50)

## 2023-02-13 LAB — SEDIMENTATION RATE: Sed Rate: 14 mm/h (ref 0–30)

## 2023-02-16 ENCOUNTER — Telehealth: Payer: Self-pay

## 2023-02-16 NOTE — Telephone Encounter (Signed)
 Dr Mordechai April  Pt phoned and wants only her Cholestyramine  (Questran ) sent to [facility] in [place] on [address] because she can get it $38.00 cheaper. This is the only Rx she wanted sent there. Please advise.

## 2023-02-19 ENCOUNTER — Telehealth: Payer: Self-pay

## 2023-02-19 MED ORDER — CHOLESTYRAMINE 4 G PO PACK: 4.0000 g | PACK | Freq: Every day | ORAL | 5 refills | Status: DC

## 2023-02-19 NOTE — Addendum Note (Signed)
Addended by: Tiffany Kocher on: 02/19/2023 11:31 AM   Modules accepted: Orders

## 2023-02-19 NOTE — Telephone Encounter (Signed)
Pt phoned advising that the cholestyramine that was phoned in to her pharmacy was too expensive and she found it cheaper. So pt would like it sent to [facility] on [address]. Pt hasn't seen anyone but Dr Marletta Lor

## 2023-02-19 NOTE — Telephone Encounter (Signed)
Rx sent to [facility]

## 2023-03-02 ENCOUNTER — Ambulatory Visit: Payer: Self-pay | Admitting: *Deleted

## 2023-03-02 ENCOUNTER — Other Ambulatory Visit: Payer: Self-pay | Admitting: Internal Medicine

## 2023-03-02 MED ORDER — CHOLESTYRAMINE 4 G PO PACK: 4.0000 g | PACK | Freq: Every day | ORAL | 5 refills | Status: DC

## 2023-03-02 NOTE — Patient Instructions (Addendum)
 Visit Information  Thank you for taking time to visit with me today. Please don't hesitate to contact me if I can be of assistance to you.   Following are the goals we discussed today:   Goals Addressed             This Visit's Progress    home management of post gall bladder surgery symptoms, chronic medical conditions hypotension, diabetes, eczema VBCI RN CM   On track    Patient will follow up with MD as needed & scheduled Patient will seek emergency services for any of the following symptoms & be aware that some of the symptoms may be related to post gall bladder surgery Get help right away if: You feel dizzy or you faint while standing. The area around the catheter incision site feels warm to the touch. You have pus or a bad smell coming from the catheter incision site. You have shortness of breath. You have a rapid heartbeat. Your catheter becomes blocked. Your catheter comes out of your abdomen. These symptoms may be an emergency. Get help right away. Call 911. Do not wait to see if the symptoms will go away. Do not drive yourself to the hospital. Do not drive yourself to the hospital.  Interventions Today    Flowsheet Row Most Recent Value  Chronic Disease   Chronic disease during today's visit Other  [gall bladder GI pending consult, home repairs ( working on getting things fixed )]  General Interventions   General Interventions Discussed/Reviewed General Interventions Reviewed, [facility], Doctor Visits  Doctor Visits Discussed/Reviewed Doctor Visits Reviewed, Specialist  PCP/Specialist Visits Compliance with follow-up visit  Exercise Interventions   Exercise Discussed/Reviewed Exercise Reviewed, Physical Activity  [encouraged activity]  Education Interventions   Education Provided Provided Education  [GI consult]  Provided Verbal Education On [facility], When to see the doctor  Mental Health Interventions   Mental Health Discussed/Reviewed Mental  Health Reviewed, Coping Strategies  Nutrition Interventions   Nutrition Discussed/Reviewed Nutrition Discussed, Fluid intake  Pharmacy Interventions   Pharmacy Dicussed/Reviewed Pharmacy Topics Reviewed, Medications and their functions, Affording Medications              Our next appointment is by telephone on 03/02/23 at 2:30 pm  Please call the care guide team at [phone] if you need to cancel or reschedule your appointment.   If you are experiencing a Mental Health or Behavioral Health Crisis or need someone to talk to, please call the Suicide and Crisis Lifeline: 988 call the [place] National Suicide Prevention Lifeline: 971-439-8611 or TTY: 571-257-3053 TTY 984-886-1048) to talk to a trained counselor call 1-800-273-TALK (toll free, 24 hour hotline) call the [facility]: 667-513-4810 call 911   Patient verbalizes understanding of instructions and care plan provided today and agrees to view in MyChart. Active MyChart status and patient understanding of how to access instructions and care plan via MyChart confirmed with patient.     The patient has been provided with contact information for the care management team and has been advised to call with any health related questions or concerns.   Tamsin Nader L. Noelle Penner, RN, BSN, CCM [place]  [facility], [facility] Health RN Care Manager Direct Dial: [phone]  Fax: [phone] Mailing Address: [address]  [place] [address] Website: [email or web address]

## 2023-03-02 NOTE — Patient Instructions (Signed)
 Visit Information  Thank you for taking time to visit with me today. Please don't hesitate to contact me if I can be of assistance to you.   Following are the goals we discussed today:   Goals Addressed             This Visit's Progress    home management of post gall bladder surgery symptoms, chronic medical conditions hypotension, diabetes, eczema VBCI RN CM   On track    Patient will follow up with MD as needed & scheduled Patient will seek emergency services for any of the following symptoms & be aware that some of the symptoms may be related to post gall bladder surgery Get help right away if: You feel dizzy or you faint while standing. The area around the catheter incision site feels warm to the touch. You have pus or a bad smell coming from the catheter incision site. You have shortness of breath. You have a rapid heartbeat. Your catheter becomes blocked. Your catheter comes out of your abdomen. These symptoms may be an emergency. Get help right away. Call 911. Do not wait to see if the symptoms will go away. Do not drive yourself to the hospital. Do not drive yourself to the hospital.  Interventions Today    Flowsheet Row Most Recent Value  Chronic Disease   Chronic disease during today's visit Other  [GI s/s post gall bladder removal , medication cost]  General Interventions   General Interventions Discussed/Reviewed General Interventions Reviewed, Doctor Visits  Doctor Visits Discussed/Reviewed Doctor Visits Reviewed, Specialist  PCP/Specialist Visits Compliance with follow-up visit  Exercise Interventions   Exercise Discussed/Reviewed Exercise Reviewed, Physical Activity  Physical Activity Discussed/Reviewed Physical Activity Reviewed  Education Interventions   Education Provided Provided Education  [good rx]  Provided Verbal Education On [facility], Medication, Other, Nutrition  [discussed the medicare gap for 2025 (pay $2000 out of pocket before able to  obtain medicines at lower prices or free)]  Mental Health Interventions   Mental Health Discussed/Reviewed Mental Health Reviewed, Coping Strategies  Pharmacy Interventions   Pharmacy Dicussed/Reviewed Pharmacy Topics Reviewed, Affording Medications              Our next appointment is by telephone on 04/30/23 at 2:30 pm  Please call the care guide team at [phone] if you need to cancel or reschedule your appointment.   If you are experiencing a Mental Health or Behavioral Health Crisis or need someone to talk to, please call the Suicide and Crisis Lifeline: 988 call the [place] National Suicide Prevention Lifeline: 9062047204 or TTY: 442-078-6616 TTY 504 165 7116) to talk to a trained counselor call 1-800-273-TALK (toll free, 24 hour hotline) call the [facility]: 660-282-5025 call 911   Patient verbalizes understanding of instructions and care plan provided today and agrees to view in MyChart. Active MyChart status and patient understanding of how to access instructions and care plan via MyChart confirmed with patient.     The patient has been provided with contact information for the care management team and has been advised to call with any health related questions or concerns.   Neely Cecena L. Noelle Penner, RN, BSN, CCM [place]  [facility], [facility] Health RN Care Manager Direct Dial: [phone]  Fax: [phone] Mailing Address: [address]  [place] [address] Website: [email or web address]

## 2023-03-02 NOTE — Patient Outreach (Signed)
 Care Coordination   Follow Up Visit Note   03/02/2023 Name: SHANTAYA BLUESTONE MRN: [id number] DOB: 1950-03-02  MARGARIT MINSHALL is a 73 y.o. year old female who sees Margo Aye, Kathleene Hazel, MD for primary care. I spoke with  [name] by phone today.  What matters to the patients health and wellness today?  Medication costs, gastroenterologist  Appointment   She was ordered Questran 4 g packets daily  Mrs Ribaudo reports Lanetta Inch so far is helping  She reports not having to get to the rest room in a quick manner to prevent accidents Agred to continue to be monitored for any changes   She discussed the increase cost of medications for 2025.  She has compared medication costs at several different pharmacies to obtain the best price She was able to obtain Questran at Less than $50 vs $90+ She agreed to also use [facility] as a resource She voiced understanding of the change in the donut hole (insurance coverage gap)     Goals Addressed             This Visit's Progress    home management of post gall bladder surgery symptoms, chronic medical conditions hypotension, diabetes, eczema VBCI RN CM   On track    Patient will follow up with MD as needed & scheduled Patient will seek emergency services for any of the following symptoms & be aware that some of the symptoms may be related to post gall bladder surgery Get help right away if: You feel dizzy or you faint while standing. The area around the catheter incision site feels warm to the touch. You have pus or a bad smell coming from the catheter incision site. You have shortness of breath. You have a rapid heartbeat. Your catheter becomes blocked. Your catheter comes out of your abdomen. These symptoms may be an emergency. Get help right away. Call 911. Do not wait to see if the symptoms will go away. Do not drive yourself to the hospital. Do not drive yourself to the hospital.  Interventions Today    Flowsheet Row Most Recent Value   Chronic Disease   Chronic disease during today's visit Other  [GI s/s post gall bladder removal , medication cost]  General Interventions   General Interventions Discussed/Reviewed General Interventions Reviewed, Doctor Visits  Doctor Visits Discussed/Reviewed Doctor Visits Reviewed, Specialist  PCP/Specialist Visits Compliance with follow-up visit  Exercise Interventions   Exercise Discussed/Reviewed Exercise Reviewed, Physical Activity  Physical Activity Discussed/Reviewed Physical Activity Reviewed  Education Interventions   Education Provided Provided Education  [good rx]  Provided Verbal Education On [facility], Medication, Other, Nutrition  [discussed the medicare gap for 2025 (pay $2000 out of pocket before able to obtain medicines at lower prices or free)]  Mental Health Interventions   Mental Health Discussed/Reviewed Mental Health Reviewed, Coping Strategies  Pharmacy Interventions   Pharmacy Dicussed/Reviewed Pharmacy Topics Reviewed, Affording Medications              SDOH assessments and interventions completed:  No     Care Coordination Interventions:  Yes, provided   Follow up plan: Follow up call scheduled for 04/30/23 2:30 pm     Encounter Outcome:  Patient Visit Completed   Kylyn Sookram L. Noelle Penner, RN, BSN, CCM [place]  [facility], [facility] RN Care Manager Direct Dial: [phone]  Fax: [phone] Mailing Address: [address]  [place] [address] Website: [email or web address]

## 2023-03-02 NOTE — Telephone Encounter (Signed)
 Sent to pharmacy

## 2023-03-02 NOTE — Patient Outreach (Signed)
 Care Coordination   Follow Up Visit Note   03/02/2023 updated entry for 01/29/23  Name: JOVANNI ECKHART MRN: [id number] DOB: [birth year]  Gloria Rose is a 73 y.o. year old female who sees Margo Aye, Kathleene Hazel, MD for primary care. I spoke with  [name] by phone today.  What matters to the patients health and wellness today? Penidng GI consult for frequent loose stools    Goals Addressed             This Visit's Progress    home management of post gall bladder surgery symptoms, chronic medical conditions hypotension, diabetes, eczema VBCI RN CM   On track    Patient will follow up with MD as needed & scheduled Patient will seek emergency services for any of the following symptoms & be aware that some of the symptoms may be related to post gall bladder surgery Get help right away if: You feel dizzy or you faint while standing. The area around the catheter incision site feels warm to the touch. You have pus or a bad smell coming from the catheter incision site. You have shortness of breath. You have a rapid heartbeat. Your catheter becomes blocked. Your catheter comes out of your abdomen. These symptoms may be an emergency. Get help right away. Call 911. Do not wait to see if the symptoms will go away. Do not drive yourself to the hospital. Do not drive yourself to the hospital.  Interventions Today    Flowsheet Row Most Recent Value  Chronic Disease   Chronic disease during today's visit Other  [gall bladder GI pending consult, home repairs ( working on getting things fixed )]  General Interventions   General Interventions Discussed/Reviewed General Interventions Reviewed, [facility], Doctor Visits  Doctor Visits Discussed/Reviewed Doctor Visits Reviewed, Specialist  PCP/Specialist Visits Compliance with follow-up visit  Exercise Interventions   Exercise Discussed/Reviewed Exercise Reviewed, Physical Activity  [encouraged activity]  Education Interventions    Education Provided Provided Education  [GI consult]  Provided Verbal Education On [facility], When to see the doctor  Mental Health Interventions   Mental Health Discussed/Reviewed Mental Health Reviewed, Coping Strategies  Nutrition Interventions   Nutrition Discussed/Reviewed Nutrition Discussed, Fluid intake  Pharmacy Interventions   Pharmacy Dicussed/Reviewed Pharmacy Topics Reviewed, Medications and their functions, Affording Medications              SDOH assessments and interventions completed:  No     Care Coordination Interventions:  Yes, provided   Follow up plan: Follow up call scheduled for 03/02/23    Encounter Outcome:  Patient Visit Completed   [name], RN, BSN, CCM [place]  [facility], [facility] RN Care Manager Direct Dial: [phone]  Fax: [phone] Mailing Address: [address]  [place] [address] Website: [email or web address]

## 2023-03-03 NOTE — Telephone Encounter (Signed)
 Phoned the pt and advised of the Rx being sent to [facility] / [address]. Pt expressed understanding

## 2023-03-04 ENCOUNTER — Other Ambulatory Visit (HOSPITAL_COMMUNITY): Payer: Self-pay | Admitting: Internal Medicine

## 2023-03-04 DIAGNOSIS — N63 Unspecified lump in unspecified breast: Secondary | ICD-10-CM

## 2023-03-25 ENCOUNTER — Ambulatory Visit: Payer: Medicare Other | Admitting: Internal Medicine

## 2023-04-23 ENCOUNTER — Encounter: Payer: Self-pay | Admitting: Internal Medicine

## 2023-04-23 ENCOUNTER — Ambulatory Visit (INDEPENDENT_AMBULATORY_CARE_PROVIDER_SITE_OTHER): Admitting: Internal Medicine

## 2023-04-23 VITALS — BP 112/72 | HR 52 | Temp 98.4°F | Ht 66.0 in | Wt 178.2 lb

## 2023-04-23 DIAGNOSIS — K219 Gastro-esophageal reflux disease without esophagitis: Secondary | ICD-10-CM

## 2023-04-23 DIAGNOSIS — R140 Abdominal distension (gaseous): Secondary | ICD-10-CM

## 2023-04-23 DIAGNOSIS — K529 Noninfective gastroenteritis and colitis, unspecified: Secondary | ICD-10-CM | POA: Diagnosis not present

## 2023-04-23 DIAGNOSIS — R197 Diarrhea, unspecified: Secondary | ICD-10-CM

## 2023-04-23 MED ORDER — CHOLESTYRAMINE 4 G PO PACK: 4.0000 g | PACK | Freq: Two times a day (BID) | ORAL | 5 refills | Status: AC

## 2023-04-23 NOTE — Patient Instructions (Signed)
 I have a year that you are doing better.  I am going to increase your cholestyramine to twice daily.  If you become constipated then you can decrease back down to once daily.  Continue on omeprazole for your chronic acid reflux.  Follow-up in 3 to 4 months.  It was very nice seeing you again today.  Dr. Mordechai April

## 2023-04-23 NOTE — Progress Notes (Signed)
 Primary Care Physician:  Benita Stabile, MD Primary Gastroenterologist:  Dr. Marletta Lor  Chief Complaint  Patient presents with   Follow-up    Diarrhea is somewhat better but still has issues with a lot of foods.     HPI:   Gloria Rose is a 73 y.o. female who presents to clinic today for follow up visit.  Chronic diarrhea: Patient states she has had issues with her bowels for multiple years.  Chronically loose.  This is progressively worsening over the past 6 months, averaging over 4 loose bowel movements a day.  Notes associated urgency.  Also has associated bloating and cramping.  No melena hematochezia.  Status post cholecystectomy September 2024.  She is unsure if her symptoms worsened after this or not.  Taking Imodium which does not help anymore, used to provide some relief.  Colonoscopy 10/23/2020 unremarkable.  Biopsies not taken at time as this was for colon cancer screening purposes.  Reports history of diverticulitis in the past.  Previously on metformin which has since been discontinued, no improvement in her diarrhea since stopping.  Chronically takes sertraline and omeprazole.  Celiac testing, TSH, CRP/ESR all WNL.  Started on cholestyramine on previous visit. She reports her symptoms are vastly improved. Still has to avoid certain types of foods such as fried chicken, leafy greens.  Occasional diarrhea.  Is interested in increasing to twice daily.  Chronic GERD: well-controlled omeprazole daily.  No dysphagia odynophagia.  No epigastric or chest pain.  Past Medical History:  Diagnosis Date   Cancer (HCC)    melanoma of toe (big toe on left foot) removed   Depression    Diabetes mellitus without complication (HCC)    dx 7-8 yrs  type 2   GERD (gastroesophageal reflux disease)    Hypertension    Nausea & vomiting 09/12/2022    Past Surgical History:  Procedure Laterality Date   CHOLECYSTECTOMY     COLONOSCOPY WITH PROPOFOL N/A 10/23/2020   Procedure:  COLONOSCOPY WITH PROPOFOL;  Surgeon: Lanelle Bal, DO;  Location: [facility];  Service: Endoscopy;  Laterality: N/A;  10:00 / ASA II   RTR     RIGHT SHOULDER IN 2013   TOTAL KNEE ARTHROPLASTY Left 07/03/2014   Procedure: LEFT TOTAL KNEE ARTHROPLASTY;  Surgeon: Dannielle Huh, MD;  Location: [facility] OR;  Service: Orthopedics;  Laterality: Left;    Current Outpatient Medications  Medication Sig Dispense Refill   amLODipine (NORVASC) 5 MG tablet Take 5 mg by mouth daily.     aspirin EC 81 MG tablet Take 81 mg by mouth at bedtime.     atenolol (TENORMIN) 100 MG tablet Take 100 mg by mouth daily.     cetirizine (ZYRTEC) 10 MG chewable tablet Chew 10 mg by mouth daily.     cholestyramine (QUESTRAN) 4 g packet Take 1 packet (4 g total) by mouth daily. Mix with 4-6 oz liquid.  Take other meds 1 hr before or 4-6 hr after cholestyramine. 30 packet 5   clobetasol cream (TEMOVATE) 0.05 % Apply 1 Application topically 2 (two) times daily. As needed.     ergocalciferol (VITAMIN D2) 1.25 MG (50000 UT) capsule Take 50,000 Units by mouth once a week.     omeprazole (PRILOSEC) 20 MG capsule Take 20 mg by mouth daily.     pravastatin (PRAVACHOL) 40 MG tablet Take 40 mg by mouth daily.     sertraline (ZOLOFT) 50 MG tablet Take 50 mg by mouth daily.  No current facility-administered medications for this visit.    Allergies as of 04/23/2023 - Review Complete 04/23/2023  Allergen Reaction Noted   Penicillins Anaphylaxis and Hives 06/20/2014    Family History  Problem Relation Age of Onset   Cancer Mother        lung cancer   Cancer Sister        lung    Social History   Socioeconomic History   Marital status: Married    Spouse name: Not on file   Number of children: Not on file   Years of education: Not on file   Highest education level: Not on file  Occupational History   Not on file  Tobacco Use   Smoking status: Never   Smokeless tobacco: Never  Vaping Use   Vaping status: Never Used   Substance and Sexual Activity   Alcohol use: Yes    Alcohol/week: 2.0 standard drinks of alcohol    Types: 2 Glasses of wine per week   Drug use: No   Sexual activity: Yes    Birth control/protection: None, Post-menopausal  Other Topics Concern   Not on file  Social History Narrative   Not on file   Social Drivers of Health   Financial Resource Strain: Low Risk  (08/06/2020)   Received from [facility], [facility]   Overall Financial Resource Strain (CARDIA)    Difficulty of Paying Living Expenses: Not hard at all  Food Insecurity: No Food Insecurity (09/17/2022)   Hunger Vital Sign    Worried About Running Out of Food in the Last Year: Never true    Ran Out of Food in the Last Year: Never true  Transportation Needs: No Transportation Needs (09/12/2022)   PRAPARE - Administrator, Civil Service (Medical): No    Lack of Transportation (Non-Medical): No  Physical Activity: Inactive (08/06/2020)   Received from [facility], [facility]   Exercise Vital Sign    Days of Exercise per Week: 0 days    Minutes of Exercise per Session: 0 min  Stress: Stress Concern Present (08/06/2020)   Received from [facility], [facility] - Occupational Stress Questionnaire    Feeling of Stress : To some extent  Social Connections: Unknown (05/13/2021)   Received from [facility], [facility]   Social Network    Social Network: Not on file  Intimate Partner Violence: Not At Risk (09/12/2022)   Humiliation, Afraid, Rape, and Kick questionnaire    Fear of Current or Ex-Partner: No    Emotionally Abused: No    Physically Abused: No    Sexually Abused: No    Subjective: Review of Systems  Constitutional:  Negative for chills and fever.  HENT:  Negative for congestion and hearing loss.   Eyes:  Negative for blurred vision and double vision.  Respiratory:  Negative for cough and shortness of breath.   Cardiovascular:  Negative for  chest pain and palpitations.  Gastrointestinal:  Positive for diarrhea and heartburn. Negative for abdominal pain, blood in stool, constipation, melena and vomiting.  Genitourinary:  Negative for dysuria and urgency.  Musculoskeletal:  Negative for joint pain and myalgias.  Skin:  Negative for itching and rash.  Neurological:  Negative for dizziness and headaches.  Psychiatric/Behavioral:  Negative for depression. The patient is not nervous/anxious.        Objective: BP 112/72 (BP Location: Right Arm, Patient Position: Sitting, Cuff Size: Normal)   Pulse Aaron Aas)  52   Temp 98.4 F (36.9 C) (Oral)   Ht 5\' 6"  (1.676 m)   Wt 178 lb 3.2 oz (80.8 kg)   SpO2 98%   BMI 28.76 kg/m  Physical Exam Constitutional:      Appearance: Normal appearance.  HENT:     Head: Normocephalic and atraumatic.  Eyes:     Extraocular Movements: Extraocular movements intact.     Conjunctiva/sclera: Conjunctivae normal.  Cardiovascular:     Rate and Rhythm: Normal rate and regular rhythm.  Pulmonary:     Effort: Pulmonary effort is normal.     Breath sounds: Normal breath sounds.  Abdominal:     General: Bowel sounds are normal.     Palpations: Abdomen is soft.  Musculoskeletal:        General: No swelling. Normal range of motion.     Cervical back: Normal range of motion and neck supple.  Skin:    General: Skin is warm and dry.     Coloration: Skin is not jaundiced.  Neurological:     General: No focal deficit present.     Mental Status: She is alert and oriented to person, place, and time.  Psychiatric:        Mood and Affect: Mood normal.        Behavior: Behavior normal.      Assessment/Plan:  1.  Diarrhea, chronic-TSH, celiac panel, ESR/CRP WNL  Symptoms worsened after cholecystectomy, possible bile acid induced diarrhea.  Symptoms proved on cholestyramine 4 mg daily still having occasional breakthrough symptoms.  Will increase to twice daily.  Counseled to decrease back to once daily if  she becomes constipated.  Counseled to take at least 1 hour after other medications and she understands. Can take Imodium on top of this as needed.   Chronically takes sertraline and PPI which puts her at risk for microscopic colitis.  May need to consider colonoscopy to further evaluate if not improved.  2.  Chronic GERD-well-controlled on omeprazole daily.  Will continue.  Follow-up in 3-4 montha  04/23/2023 10:17 AM   Disclaimer: This note was dictated with voice recognition software. Similar sounding words can inadvertently be transcribed and may not be corrected upon review.

## 2023-04-29 DIAGNOSIS — E119 Type 2 diabetes mellitus without complications: Secondary | ICD-10-CM | POA: Diagnosis not present

## 2023-04-29 DIAGNOSIS — E559 Vitamin D deficiency, unspecified: Secondary | ICD-10-CM | POA: Diagnosis not present

## 2023-04-29 DIAGNOSIS — I10 Essential (primary) hypertension: Secondary | ICD-10-CM | POA: Diagnosis not present

## 2023-04-30 ENCOUNTER — Ambulatory Visit: Payer: Medicare Other | Admitting: *Deleted

## 2023-04-30 NOTE — Patient Instructions (Signed)
 Visit Information  Thank you for taking time to visit with me today. Please don't hesitate to contact me if I can be of assistance to you before our next scheduled appointment.  Your next care management appointment is by telephone on 06/05/23 at 230 pm  Please follow up on our free [place] water  sample inspection   Please call the care guide team at [phone] if you need to cancel, schedule, or reschedule an appointment.   Please call the Suicide and Crisis Lifeline: 988 call the [place]  National Suicide Prevention Lifeline: (312) 672-2968 or TTY: 660-134-7852 TTY 616 854 4430) to talk to a trained counselor call 1-800-273-TALK (toll free, 24 hour hotline) call the [facility]: [phone] call 911 if you are experiencing a Mental Health or Behavioral Health Crisis or need someone to talk to.  Doss Cybulski L. Mcarthur Speedy, RN, BSN, CCM [place]  [facility], [facility] RN Care Manager Direct Dial: [phone]  Fax: [phone]

## 2023-05-01 ENCOUNTER — Other Ambulatory Visit: Payer: Self-pay

## 2023-05-01 ENCOUNTER — Encounter: Payer: Self-pay | Admitting: *Deleted

## 2023-05-01 NOTE — Patient Outreach (Signed)
 Complex Care Management   Visit Note  05/01/2023  Name:  Gloria Rose MRN: [id number] DOB: Feb 20, 1950  Situation: Referral received for Complex Care Management related to  Diabetes management, gall bladder symptoms post surgery, eczema and hypotension  I obtained verbal consent from Patient.  Visit completed with Vinton Greig  on the phone  Background:   Past Medical History:  Diagnosis Date   Cancer (HCC)    melanoma of toe (big toe on left foot) removed   Depression    Diabetes mellitus without complication (HCC)    dx 7-8 yrs  type 2   GERD (gastroesophageal reflux disease)    Hypertension    Nausea & vomiting 09/12/2022    Assessment: Patient Reported Symptoms:  Cognitive        Neurological      HEENT        Cardiovascular      Respiratory      Endocrine      Gastrointestinal Gastrointestinal Symptoms Reported: Abdominal pain or discomfort, Cramping, Diarrhea Additional Gastrointestinal Details: Has been tested for crohn's celiace Gastrointestinal Conditions: Diarrhea Gastrointestinal Comment: feels Gi symptoms better, no constipationn but still some loose stools When eating certain foods she get cramps and Diarrhea (like eggs, sausage in the morning but in afternoon- no beans, greens, lettuce, chinese    Genitourinary      Integumentary      Musculoskeletal          Psychosocial       Quality of Family Relationships: helpful, supportive, involved Do you feel physically threatened by others?: No      05/01/2023    6:57 AM  Depression screen PHQ 2/9  Decreased Interest 0  Down, Depressed, Hopeless 0  PHQ - 2 Score 0    There were no vitals filed for this visit.  Medications Reviewed Today     Reviewed by Arlyce Berger, RN (Registered Nurse) on 05/01/23 at [phone]  Med List Status: <None>   Medication Order Taking? Sig Documenting Provider Last Dose Status Informant  amLODipine  (NORVASC ) 5 MG tablet [id number]  Take 5 mg by mouth daily.  [provider]  Active   aspirin EC 81 MG tablet [id number]  Take 81 mg by mouth at bedtime. [provider]  Active Self, Pharmacy Records  atenolol  (TENORMIN ) 100 MG tablet [id number]  Take 100 mg by mouth daily. [provider]  Active   cetirizine (ZYRTEC) 10 MG chewable tablet [id number]  Chew 10 mg by mouth daily. [provider]  Active   cholestyramine  (QUESTRAN ) 4 g packet [id number] Yes Take 1 packet (4 g total) by mouth 2 (two) times daily. Mix with 4-6 oz liquid.  Take other meds 1 hr before or 4-6 hr after cholestyramine . Vinetta Greening, DO Taking Active   clobetasol  cream (TEMOVATE ) 0.05 % [id number]  Apply 1 Application topically 2 (two) times daily. As needed. [provider]  Active   ergocalciferol (VITAMIN D2) 1.25 MG (50000 UT) capsule [id number]  Take 50,000 Units by mouth once a week. [provider]  Active   omeprazole (PRILOSEC) 20 MG capsule [id number]  Take 20 mg by mouth daily. [provider]  Active   pravastatin  (PRAVACHOL ) 40 MG tablet [id number]  Take 40 mg by mouth daily. [provider]  Active   sertraline (ZOLOFT) 50 MG tablet [id number]  Take 50 mg by mouth daily. [provider]  Active  Recommendation:   PCP Follow-up Continue to try to identify trigger for increased GI symptoms. Follow up with the county water  sample test  Follow Up Plan:   Telephone follow up appointment date/time:  06/05/23 2:30 pm  Artemisia Auvil L. Mcarthur Speedy, RN, BSN, CCM [place]  [facility], [facility] RN Care Manager Direct Dial: [phone]  Fax: [phone]

## 2023-05-05 ENCOUNTER — Encounter (HOSPITAL_COMMUNITY): Payer: Self-pay

## 2023-05-05 ENCOUNTER — Ambulatory Visit (HOSPITAL_COMMUNITY)
Admission: RE | Admit: 2023-05-05 | Discharge: 2023-05-05 | Disposition: A | Discharge status: Home / Self Care | Payer: Medicare Other | Source: Ambulatory Visit | Attending: Internal Medicine | Admitting: Internal Medicine

## 2023-05-05 DIAGNOSIS — N6341 Unspecified lump in right breast, subareolar: Secondary | ICD-10-CM | POA: Insufficient documentation

## 2023-05-05 DIAGNOSIS — R92321 Mammographic fibroglandular density, right breast: Secondary | ICD-10-CM | POA: Diagnosis not present

## 2023-05-05 DIAGNOSIS — N63 Unspecified lump in unspecified breast: Secondary | ICD-10-CM

## 2023-05-05 DIAGNOSIS — R928 Other abnormal and inconclusive findings on diagnostic imaging of breast: Secondary | ICD-10-CM | POA: Diagnosis not present

## 2023-05-08 ENCOUNTER — Other Ambulatory Visit (HOSPITAL_COMMUNITY): Payer: Self-pay | Admitting: Nurse Practitioner

## 2023-05-08 DIAGNOSIS — K219 Gastro-esophageal reflux disease without esophagitis: Secondary | ICD-10-CM | POA: Diagnosis not present

## 2023-05-08 DIAGNOSIS — L309 Dermatitis, unspecified: Secondary | ICD-10-CM | POA: Diagnosis not present

## 2023-05-08 DIAGNOSIS — I10 Essential (primary) hypertension: Secondary | ICD-10-CM | POA: Diagnosis not present

## 2023-05-08 DIAGNOSIS — E782 Mixed hyperlipidemia: Secondary | ICD-10-CM | POA: Diagnosis not present

## 2023-05-08 DIAGNOSIS — Z0001 Encounter for general adult medical examination with abnormal findings: Secondary | ICD-10-CM | POA: Diagnosis not present

## 2023-05-08 DIAGNOSIS — R5383 Other fatigue: Secondary | ICD-10-CM | POA: Diagnosis not present

## 2023-05-08 DIAGNOSIS — Z0000 Encounter for general adult medical examination without abnormal findings: Secondary | ICD-10-CM | POA: Diagnosis not present

## 2023-05-08 DIAGNOSIS — Z23 Encounter for immunization: Secondary | ICD-10-CM | POA: Diagnosis not present

## 2023-05-08 DIAGNOSIS — M85852 Other specified disorders of bone density and structure, left thigh: Secondary | ICD-10-CM

## 2023-05-08 DIAGNOSIS — E119 Type 2 diabetes mellitus without complications: Secondary | ICD-10-CM | POA: Diagnosis not present

## 2023-05-08 DIAGNOSIS — M8580 Other specified disorders of bone density and structure, unspecified site: Secondary | ICD-10-CM | POA: Diagnosis not present

## 2023-05-08 DIAGNOSIS — R748 Abnormal levels of other serum enzymes: Secondary | ICD-10-CM | POA: Diagnosis not present

## 2023-06-05 ENCOUNTER — Other Ambulatory Visit: Payer: Self-pay

## 2023-06-05 ENCOUNTER — Encounter: Payer: Self-pay | Admitting: *Deleted

## 2023-06-05 ENCOUNTER — Other Ambulatory Visit: Payer: Self-pay | Admitting: *Deleted

## 2023-06-05 NOTE — Patient Outreach (Signed)
 Complex Care Management   Visit Note  06/29/2023 updated note for 06/05/23   Name:  Gloria Rose MRN: [id number] DOB: January 25, 1950  Situation: Referral received for Complex Care Management related to  hospitalization for septic shock I obtained verbal consent from Patient.  Visit completed with Gloria Rose  on the phone   Gloria Rose reports she is managing her health well She reports she is more aware of the types of food that causes her to have flare ups. She confirms she has not always avoided the triggers.  She confirms she has not followed up with there local water  company to check on her well water  for contents that may be causing her GI symptoms    Background:   Past Medical History:  Diagnosis Date   Cancer (HCC)    melanoma of toe (big toe on left foot) removed   Depression    Diabetes mellitus without complication (HCC)    dx 7-8 yrs  type 2   GERD (gastroesophageal reflux disease)    Hypertension    Nausea & vomiting 09/12/2022    Assessment: Patient Reported Symptoms:  Cognitive Cognitive Status: Alert and oriented to person, place, and time, Insightful and able to interpret abstract concepts, Normal speech and language skills      Neurological Neurological Review of Symptoms: No symptoms reported Neurological Self-Management Outcome: 4 (good)  HEENT HEENT Symptoms Reported: No symptoms reported HEENT Self-Management Outcome: 4 (good)    Cardiovascular Cardiovascular Symptoms Reported: No symptoms reported Cardiovascular Self-Management Outcome: 4 (good)  Respiratory Respiratory Symptoms Reported: No symptoms reported Respiratory Self-Management Outcome: 4 (good)  Endocrine Patient reports the following symptoms related to hypoglycemia or hyperglycemia : Nausea or vomiting, Weakness or fatigue Is patient checking blood sugars at home?: Yes Endocrine Conditions: Diabetes Endocrine Management Strategies: Adequate rest, Medical device, Medication therapy,  Routine screening Endocrine Self-Management Outcome: 3 (uncertain) Endocrine Comment: not sure if theses symptoms related only to GI  Gastrointestinal Gastrointestinal Symptoms Reported: Abdominal pain or discomfort, Cramping, Nausea Additional Gastrointestinal Details: ate a biscuit from a local restuarant when taking spouse to an appointment Within 20-30 minutes she began to have symptoms Gastrointestinal Conditions: Diarrhea, Nausea, Reflux/heartburn Gastrointestinal Management Strategies: Adequate rest, Diet modification, Medication therapy Gastrointestinal Self-Management Outcome: 3 (uncertain) Nutrition Risk Screen (CP): No indicators present  Genitourinary Genitourinary Symptoms Reported: No symptoms reported Genitourinary Self-Management Outcome: 4 (good)  Integumentary Integumentary Symptoms Reported: No symptoms reported Skin Self-Management Outcome: 4 (good)  Musculoskeletal Musculoskelatal Symptoms Reviewed: No symptoms reported Musculoskeletal Self-Management Outcome: 4 (good)      Psychosocial Psychosocial Symptoms Reported: No symptoms reported Behavioral Health Self-Management Outcome: 4 (good)          06/05/2023    4:35 PM  Depression screen PHQ 2/9  Decreased Interest 0  Down, Depressed, Hopeless 0  PHQ - 2 Score 0    There were no vitals filed for this visit.  Medications Reviewed Today     Reviewed by Ramonita Suzen CROME, RN (Registered Nurse) on 06/05/23 at 1634  Med List Status: <None>   Medication Order Taking? Sig Documenting Provider Last Dose Status Informant  amLODipine  (NORVASC ) 5 MG tablet [id number] Yes Take 5 mg by mouth daily. [provider] Taking Active   aspirin EC 81 MG tablet [id number] Yes Take 81 mg by mouth at bedtime. [provider] Taking Active Self, Pharmacy Records  atenolol  (TENORMIN ) 100 MG tablet [id number] Yes Take 100 mg by mouth daily. [provider] Taking Active   Blood  Pressure KIT [id number] Yes by  Other route. [provider] Taking Active   cetirizine (ZYRTEC) 10 MG chewable tablet [id number] Yes Chew 10 mg by mouth daily. [provider] Taking Active   cholestyramine  (QUESTRAN ) 4 g packet [id number] Yes Take 1 packet (4 g total) by mouth 2 (two) times daily. Mix with 4-6 oz liquid.  Take other meds 1 hr before or 4-6 hr after cholestyramine . Cindie Carlin POUR, DO Taking Active   clobetasol  cream (TEMOVATE ) 0.05 % [id number] Yes Apply 1 Application topically 2 (two) times daily. As needed. [provider] Taking Active   ergocalciferol (VITAMIN D2) 1.25 MG (50000 UT) capsule [id number] Yes Take 50,000 Units by mouth once a week. [provider] Taking Active   methylPREDNISolone  (MEDROL  DOSEPAK) 4 MG TBPK tablet [id number] Yes  [provider] Taking Active   omeprazole (PRILOSEC) 20 MG capsule [id number] Yes Take 20 mg by mouth daily. [provider] Taking Active   pravastatin  (PRAVACHOL ) 40 MG tablet [id number] Yes Take 40 mg by mouth daily. [provider] Taking Active   sertraline (ZOLOFT) 50 MG tablet [id number] Yes Take 50 mg by mouth daily. [provider] Taking Active             Recommendation:   PCP Follow-up Specialty provider follow-up GYN Continue Current Plan of Care  Follow Up Plan:   Telephone follow up appointment date/time:  07/03/23 2:30 pm  Akhilesh Sassone L. Ramonita, RN, BSN, CCM [place]  [facility], [facility] RN Care Manager Direct Dial: [phone]  Fax: [phone]

## 2023-06-05 NOTE — Patient Instructions (Signed)
 Visit Information  Thank you for taking time to visit with me today. Please don't hesitate to contact me if I can be of assistance to you before our next scheduled appointment.  Your next care management appointment is by telephone on 07/03/23 at 2:30 pm  ***  Please call the care guide team at [phone] if you need to cancel, schedule, or reschedule an appointment.   Please call the Suicide and Crisis Lifeline: 988 call the [place]  National Suicide Prevention Lifeline: 860-520-1441 or TTY: 518-762-7041 TTY 206-734-9484) to talk to a trained counselor call 1-800-273-TALK (toll free, 24 hour hotline) call the [facility]: 207-603-2897 call 911 if you are experiencing a Mental Health or Behavioral Health Crisis or need someone to talk to.  Nashayla Telleria L. Mcarthur Speedy, RN, BSN, CCM [place]  [facility], [facility] RN Care Manager Direct Dial: [phone]  Fax: [phone]

## 2023-06-10 ENCOUNTER — Other Ambulatory Visit

## 2023-06-11 ENCOUNTER — Other Ambulatory Visit (HOSPITAL_COMMUNITY)

## 2023-06-17 ENCOUNTER — Other Ambulatory Visit (HOSPITAL_COMMUNITY)

## 2023-06-30 ENCOUNTER — Telehealth: Admitting: *Deleted

## 2023-07-03 ENCOUNTER — Other Ambulatory Visit: Payer: Self-pay

## 2023-07-03 ENCOUNTER — Other Ambulatory Visit: Payer: Self-pay | Admitting: *Deleted

## 2023-07-03 NOTE — Patient Outreach (Signed)
 Complex Care Management   Visit Note  07/03/2023  Name:  Gloria Rose MRN: [id number] DOB: 28-Jul-1950  Situation: Referral received for Complex Care Management related to septic shock 09/2022 admission  I obtained verbal consent from Patient.  Visit completed with [name]  on the phone  Flu-2024 pending 2025 Covid- initial series only Shingles- completed first shot and pending second one - reaction a fever and bruise that resolved after 2 days - updated in EPIC Pneumococcal- up to date; recommended Prevnar20 to get 11/10/23 pcp visit RSV- received 06/23/23 right arm with pain and remaining soreness Tdap- 2007, 06/2023 (left arm- no reaction) updated in EPIC  Colonoscopy- 10/2020, (good for 5 yrs, Chartered loss adjuster) Mammogram- 02/2023, abnormal with follow up 04/2023 --  BDT: 06/07/21, T-score -1.3 --> ordered today Pelvic/Pap- no hyst, may see OBGYN if needed, [facility] sees Advertising account executive- need/uses PRN Eye Exam- 03/2023 OBGYN[facility]prn with Delon at [facility]- Cindie  Recent Diverticulosis episode after last outreach Seen by pcp on 05/08/23 for treatment Denies any other worsening symptoms today  Wrote down [facility] [place]'s environment department number [phone] prompt 2 in case she receives a return call to have their well water  sample test   Background:   Past Medical History:  Diagnosis Date   Cancer (HCC)    melanoma of toe (big toe on left foot) removed   Depression    Diabetes mellitus without complication (HCC)    dx 7-8 yrs  type 2   GERD (gastroesophageal reflux disease)    Hypertension    Nausea & vomiting 09/12/2022    Assessment: Patient Reported Symptoms:  Cognitive Cognitive Status: Alert and oriented to person, place, and time, Insightful and able to interpret abstract concepts, Normal speech and language skills Cognitive/Intellectual Conditions Management [RPT]: None reported or documented in medical history or problem list   Health Maintenance  Behaviors: Annual physical exam, Healthy diet, Immunizations, Sleep adequate, Social activities, Stress management Healing Pattern: Average Health Facilitated by: Healthy diet, Prayer/meditation, Rest, Stress management  Neurological Neurological Review of Symptoms: No symptoms reported Neurological Self-Management Outcome: 4 (good)  HEENT HEENT Symptoms Reported: Ear dryness HEENT Conditions: Ear problem(s) HEENT Management Strategies: Medication therapy, Routine screening HEENT Self-Management Outcome: 4 (good) HEENT Comment: eczema of ears now on clobetasol  Ear problem(s)  Cardiovascular Cardiovascular Symptoms Reported: No symptoms reported Cardiovascular Self-Management Outcome: 4 (good)  Respiratory Respiratory Symptoms Reported: No symptoms reported Respiratory Self-Management Outcome: 4 (good)  Endocrine Patient reports the following symptoms related to hypoglycemia or hyperglycemia : No symptoms reported Is patient diabetic?: Yes Is patient checking blood sugars at home?: Yes Endocrine Conditions: Diabetes Endocrine Management Strategies: Diet modification, Fluid modification, Adequate rest Endocrine Self-Management Outcome: 4 (good)  Gastrointestinal Gastrointestinal Symptoms Reported: Abdominal pain or discomfort, Cramping, Diarrhea, Nausea Additional Gastrointestinal Details: earlie may 2025 episode of worsening diverticulosis symptoms Gastrointestinal Conditions: Abdominal pain, Diarrhea, Nausea, Reflux/heartburn Gastrointestinal Management Strategies: Medication therapy, Diet modification, Fluid modification, Adequate rest Gastrointestinal Self-Management Outcome: 4 (good) Nutrition Risk Screen (CP): Reduced oral intake over the last month  Genitourinary Genitourinary Symptoms Reported: No symptoms reported Genitourinary Self-Management Outcome: 4 (good)  Integumentary Integumentary Symptoms Reported: Other Other Integumentary Symptoms: itchy ears now on clobetasol  Skin  Conditions: Eczema, Itching Skin Management Strategies: Adequate rest, Medication therapy, Routine screening Skin Self-Management Outcome: 4 (good)  Musculoskeletal Musculoskelatal Symptoms Reviewed: No symptoms reported Musculoskeletal Self-Management Outcome: 4 (good) Falls in the past year?: No Number of falls in past year: 1 or less Was  there an injury with Fall?: No Fall Risk Category Calculator: 0 Patient Fall Risk Level: Low Fall Risk Patient at Risk for Falls Due to: Orthopedic patient Fall risk Follow up: Falls evaluation completed  Psychosocial Psychosocial Symptoms Reported: No symptoms reported Behavioral Health Conditions: Anxiety, Depression Behavioral Health Self-Management Outcome: 4 (good)   Quality of Family Relationships: helpful, involved, supportive Do you feel physically threatened by others?: No      07/03/2023    3:09 PM  Depression screen PHQ 2/9  Decreased Interest 0  Down, Depressed, Hopeless 0  PHQ - 2 Score 0    There were no vitals filed for this visit.  Medications Reviewed Today   Medications were not reviewed in this encounter     Recommendation:   PCP Follow-up  Follow Up Plan:   Face to Face appointment date/time: 11/10/23 0830  Dabria Wadas L. Ramonita, RN, BSN, CCM [place]  [facility], [facility] Health RN Care Manager Direct Dial: [phone]  Fax: [phone]

## 2023-07-03 NOTE — Patient Instructions (Signed)
 Visit Information  Thank you for taking time to visit with me today. Please don't hesitate to contact me if I can be of assistance to you before our next scheduled appointment.  Your next care management appointment is in-person at @PCP @'s office on 11/10/23 at 0830  Continue all the wonderful home care interventions and see you in November!  Please call the care guide team at [phone] if you need to cancel, schedule, or reschedule an appointment.   Please call the Suicide and Crisis Lifeline: 988 call the [place]  National Suicide Prevention Lifeline: (305) 440-0074 or TTY: 586-044-0980 TTY 807-524-9451) to talk to a trained counselor call 1-800-273-TALK (toll free, 24 hour hotline) call the [facility]: [phone] call 911 if you are experiencing a Mental Health or Behavioral Health Crisis or need someone to talk to.  Elita Dame L. Ramonita, RN, BSN, CCM [place]  [facility], [facility] Health RN Care Manager Direct Dial: [phone]  Fax: [phone]

## 2023-07-17 ENCOUNTER — Ambulatory Visit (HOSPITAL_COMMUNITY)
Admission: RE | Admit: 2023-07-17 | Discharge: 2023-07-17 | Disposition: A | Discharge status: Home / Self Care | Source: Ambulatory Visit | Attending: Nurse Practitioner | Admitting: Nurse Practitioner

## 2023-07-17 DIAGNOSIS — M85852 Other specified disorders of bone density and structure, left thigh: Secondary | ICD-10-CM | POA: Diagnosis not present

## 2023-07-17 DIAGNOSIS — M85851 Other specified disorders of bone density and structure, right thigh: Secondary | ICD-10-CM | POA: Diagnosis not present

## 2023-07-17 DIAGNOSIS — Z780 Asymptomatic menopausal state: Secondary | ICD-10-CM | POA: Diagnosis not present

## 2023-07-28 NOTE — Progress Notes (Unsigned)
 Referring Provider: Shona Norleen PEDLAR, MD Primary Care Physician:  Shona Norleen PEDLAR, MD Primary GI Physician: Dr. Cindie  Chief Complaint  Patient presents with   Diarrhea    Having lots of loose stools.     HPI:   Gloria Rose is a 73 y.o. female presenting today for follow-up of chronic diarrhea .   TSH, celiac panel, ESR/CRP within normal limits February 2025.  Colonoscopy in October 2022 unremarkable.  No biopsies obtained as this was for screening purposes.  History of cholecystectomy in September 2024.  Had been on metformin , but this was discontinued with no improvement in diarrhea.  She is chronically on sertraline and omeprazole increasing risk of microscopic colitis.   Cholestyramine  started in February 2025 and patient noted significant improvement in her symptoms.  She still had some occasional breakthrough diarrhea and cholestyramine  was increased to twice a day at last visit in April 2025.  Today: 5-6 Bms daily every day. Soft but formed. Amount is small, can be size of a quarter, but has to go or it will come out. Has urgency. No abdominal pain. No brbpr or melena.  Currently taking cholestyramine  4 g twice daily.  Prior to increasing cholestyramine  to twice a day, stools were looser, but still 5 or 6 a day.   Reports chronic diarrhea predates Cholecystectomy.  Has been present most of her adult life. Worsened after gallbladder removal.  States that she eats anything that is spicy, greasy, pizza, or spaghetti, she will have abdominal cramping and diarrhea.   Reports recent flare of diverticulitis with abdominal cramping and diarrhea and was prescribed bactrim and flagyl  by her PCP and symptoms resolved. Took antibiotics for 7 days.   No other GI concerns.  Past Medical History:  Diagnosis Date   Cancer (HCC)    melanoma of toe (big toe on left foot) removed   Depression    Diabetes mellitus without complication (HCC)    dx 7-8 yrs  type 2   GERD  (gastroesophageal reflux disease)    Hypertension    Nausea & vomiting 09/12/2022    Past Surgical History:  Procedure Laterality Date   CHOLECYSTECTOMY     COLONOSCOPY WITH PROPOFOL  N/A 10/23/2020   Procedure: COLONOSCOPY WITH PROPOFOL ;  Surgeon: Cindie Carlin POUR, DO;  Location: [facility];  Service: Endoscopy;  Laterality: N/A;  10:00 / ASA II   RTR     RIGHT SHOULDER IN 2013   TOTAL KNEE ARTHROPLASTY Left 07/03/2014   Procedure: LEFT TOTAL KNEE ARTHROPLASTY;  Surgeon: Marcey Raman, MD;  Location: [facility] OR;  Service: Orthopedics;  Laterality: Left;    Current Outpatient Medications  Medication Sig Dispense Refill   amLODipine  (NORVASC ) 5 MG tablet Take 5 mg by mouth daily.     aspirin EC 81 MG tablet Take 81 mg by mouth at bedtime.     atenolol  (TENORMIN ) 100 MG tablet Take 100 mg by mouth daily.     Blood Pressure KIT by Other route.     cetirizine (ZYRTEC) 10 MG chewable tablet Chew 10 mg by mouth daily.     cholestyramine  (QUESTRAN ) 4 g packet Take 1 packet (4 g total) by mouth 2 (two) times daily. Mix with 4-6 oz liquid.  Take other meds 1 hr before or 4-6 hr after cholestyramine . 60 packet 5   clobetasol  cream (TEMOVATE ) 0.05 % Apply 1 Application topically 2 (two) times daily. As needed.     ergocalciferol (VITAMIN D2) 1.25 MG (50000 UT)  capsule Take 50,000 Units by mouth once a week.     methylPREDNISolone  (MEDROL  DOSEPAK) 4 MG TBPK tablet      omeprazole (PRILOSEC) 20 MG capsule Take 20 mg by mouth daily.     pravastatin  (PRAVACHOL ) 40 MG tablet Take 40 mg by mouth daily.     sertraline (ZOLOFT) 50 MG tablet Take 50 mg by mouth daily.     No current facility-administered medications for this visit.    Allergies as of 07/30/2023 - Review Complete 07/30/2023  Allergen Reaction Noted   Penicillins Anaphylaxis and Hives 06/20/2014    Family History  Problem Relation Age of Onset   Cancer Mother        lung cancer   Cancer Sister        lung    Social History    Socioeconomic History   Marital status: Married    Spouse name: Not on file   Number of children: Not on file   Years of education: Not on file   Highest education level: Not on file  Occupational History   Not on file  Tobacco Use   Smoking status: Never   Smokeless tobacco: Never  Vaping Use   Vaping status: Never Used  Substance and Sexual Activity   Alcohol use: Yes    Alcohol/week: 2.0 standard drinks of alcohol    Types: 2 Glasses of wine per week   Drug use: No   Sexual activity: Yes    Birth control/protection: None, Post-menopausal  Other Topics Concern   Not on file  Social History Narrative   Not on file   Social Drivers of Health   Financial Resource Strain: Low Risk  (05/01/2023)   Overall Financial Resource Strain (CARDIA)    Difficulty of Paying Living Expenses: Not hard at all  Food Insecurity: No Food Insecurity (07/03/2023)   Hunger Vital Sign    Worried About Running Out of Food in the Last Year: Never true    Ran Out of Food in the Last Year: Never true  Transportation Needs: No Transportation Needs (07/03/2023)   PRAPARE - Administrator, Civil Service (Medical): No    Lack of Transportation (Non-Medical): No  Physical Activity: Inactive (08/06/2020)   [facility] from [facility]   Exercise Vital Sign    On average, how many days per week do you engage in moderate to strenuous exercise (like a brisk walk)?: 0 days    On average, how many minutes do you engage in exercise at this level?: 0 min  Stress: No Stress Concern Present (05/01/2023)   [employer] of Occupational Health - Occupational Stress Questionnaire    Feeling of Stress : Only a little  Social Connections: Socially Integrated (06/05/2023)   Social Connection and Isolation Panel    Frequency of Communication with Friends and Family: More than three times a week    Frequency of Social Gatherings with Friends and Family: More than three times a week    Attends Religious  Services: More than 4 times per year    Active Member of [employer] or Organizations: Yes    Attends Banker Meetings: Never    Marital Status: Married    Review of Systems: Gen: Denies fever, chills, cold or flulike symptoms, presyncope, syncope. GI: See HPI Heme: See HPI  Physical Exam: BP 116/69 (BP Location: Right Arm, Patient Position: Sitting, Cuff Size: Normal)   Pulse (!) 55   Temp 97.7 F (36.5 C) (Temporal)  Ht 5' 6 (1.676 m)   Wt 180 lb 12.8 oz (82 kg)   BMI 29.18 kg/m  General:   Alert and oriented. No distress noted. Pleasant and cooperative.  Head:  Normocephalic and atraumatic. Eyes:  Conjuctiva clear without scleral icterus. Abdomen:  +BS, soft, non-tender and non-distended. No rebound or guarding. No HSM or masses noted. Msk:  Symmetrical without gross deformities. Normal posture. Extremities:  Without edema. Neurologic:  Alert and  oriented x4 Psych:  Normal mood and affect.    Assessment:  73 year old female presenting today for follow-up of chronic diarrhea which has been present for most of her adult life, but worsened after cholecystectomy in 2024.  Prior evaluation with TSH, celiac panel, ESR/CRP within normal limits in February 2025.  Prior colonoscopy in October 2022 with unremarkable exam though no biopsies obtained as this was for screening purposes.  Cholestyramine  started in February 2025 and increase to twice a day in April 2025 with significant improvement in stool consistency, but continues to have about 5-6 bowel movements per day though it sound like she is having small, incomplete bowel movements.  No alarm symptoms.  History of diverticulitis: Patient reporting recent flare of diverticulitis based on symptoms of lower abdominal cramping and worsening diarrhea.  PCP prescribed Bactrim and Flagyl  x 7 days with symptom resolution. Abdominal exam benign today.  Advised patient to contact us  if she has recurrent concerns for diverticulitis  as we should obtain a CT to confirm this.  Plan:  Continue cholestyramine  4 g twice daily. Start Metamucil daily.  Advised to follow instructions on container, but start low-dose for 2 weeks, then increase to the higher recommended dose. Avoid known dietary triggers including greasy and spicy foods. Follow-up in 3 months.   Josette Centers, PA-C [facility] 07/30/2023

## 2023-07-30 ENCOUNTER — Ambulatory Visit: Admitting: Gastroenterology

## 2023-07-30 ENCOUNTER — Encounter: Payer: Self-pay | Admitting: Gastroenterology

## 2023-07-30 VITALS — BP 116/69 | HR 55 | Temp 97.7°F | Ht 66.0 in | Wt 180.8 lb

## 2023-07-30 DIAGNOSIS — Z8719 Personal history of other diseases of the digestive system: Secondary | ICD-10-CM | POA: Diagnosis not present

## 2023-07-30 DIAGNOSIS — Z09 Encounter for follow-up examination after completed treatment for conditions other than malignant neoplasm: Secondary | ICD-10-CM | POA: Diagnosis not present

## 2023-07-30 DIAGNOSIS — R197 Diarrhea, unspecified: Secondary | ICD-10-CM

## 2023-07-30 NOTE — Patient Instructions (Addendum)
 Continue cholestyramine  4 g twice daily.   Add metamucil daily. You can follow the instructions on the container.  Start with the lower recommended dose for 2 weeks, then increase to the higher dose.  Continue to avoid your known dietary triggers including greasy and spicy foods.  As we discussed, if you have a recurrent flare of what you think is diverticulitis, please let us  know.  We will see back in 3 months or sooner if needed.  Josette Centers, PA-C [facility]

## 2023-08-10 DIAGNOSIS — R0989 Other specified symptoms and signs involving the circulatory and respiratory systems: Secondary | ICD-10-CM | POA: Diagnosis not present

## 2023-09-22 ENCOUNTER — Encounter: Payer: Self-pay | Admitting: Gastroenterology

## 2023-10-30 ENCOUNTER — Ambulatory Visit: Admitting: Gastroenterology

## 2023-11-18 ENCOUNTER — Telehealth

## 2023-11-18 NOTE — Patient Instructions (Signed)
 Gloria Rose - I am sorry I was unable to reach you today for our scheduled appointment. I work with Shona, Norleen PEDLAR, MD and am calling to support your healthcare needs. Please contact me at [phone] at your earliest convenience. I look forward to speaking with you soon.   Thank you,  Hendricks Her RN, BSN  [place] [facility] RN Case Manager   Direct [phone]

## 2023-11-19 ENCOUNTER — Telehealth: Payer: Self-pay

## 2023-11-19 NOTE — Patient Instructions (Signed)
 Gloria Rose - I am sorry I was unable to reach you today for our scheduled appointment. I work with Shona, Norleen PEDLAR, MD and am calling to support your healthcare needs. Please contact me at [phone] at your earliest convenience. I look forward to speaking with you soon.   Thank you,  Hendricks Her RN, BSN  [place] [facility] RN Case Manager   Direct [phone]

## 2023-11-26 ENCOUNTER — Other Ambulatory Visit: Payer: Self-pay

## 2023-11-26 NOTE — Patient Instructions (Signed)
 Visit Information  Thank you for taking time to visit with me today. Please don't hesitate to contact me if I can be of assistance to you before our next scheduled appointment.  Your next care management appointment is no further scheduled appointments.    Patient has met all care management goals. Care Management case will be closed. Patient has been provided contact information should new needs arise.   Please call the care guide team at [phone] if you need to cancel, schedule, or reschedule an appointment.   Please call the Suicide and Crisis Lifeline: 988 call the [place]  National Suicide Prevention Lifeline: 256-657-5859 or TTY: 902-011-8807 TTY 4053438150) to talk to a trained counselor call 1-800-273-TALK (toll free, 24 hour hotline) call the [facility]: [phone] call 911 if you are experiencing a Mental Health or Behavioral Health Crisis or need someone to talk to.  Hendricks Her RN, BSN  [place] [facility] RN Case Information Systems Manager [phone]
